# Patient Record
Sex: Male | Born: 1975 | Race: White | Hispanic: No | Marital: Married | Smoking: Never smoker
Health system: Southern US, Community
[De-identification: ages and names within clinical notes are randomized; demographics above are authoritative.]

## PROBLEM LIST (undated history)

## (undated) DIAGNOSIS — Z87820 Personal history of traumatic brain injury: Secondary | ICD-10-CM

## (undated) DIAGNOSIS — F319 Bipolar disorder, unspecified: Secondary | ICD-10-CM

## (undated) DIAGNOSIS — F419 Anxiety disorder, unspecified: Secondary | ICD-10-CM

## (undated) DIAGNOSIS — M47816 Spondylosis without myelopathy or radiculopathy, lumbar region: Secondary | ICD-10-CM

## (undated) DIAGNOSIS — K7581 Nonalcoholic steatohepatitis (NASH): Secondary | ICD-10-CM

## (undated) DIAGNOSIS — K760 Fatty (change of) liver, not elsewhere classified: Secondary | ICD-10-CM

## (undated) DIAGNOSIS — S060XAA Concussion with loss of consciousness status unknown, initial encounter: Secondary | ICD-10-CM

## (undated) DIAGNOSIS — S93699A Other sprain of unspecified foot, initial encounter: Secondary | ICD-10-CM

## (undated) DIAGNOSIS — D751 Secondary polycythemia: Secondary | ICD-10-CM

## (undated) DIAGNOSIS — I2510 Atherosclerotic heart disease of native coronary artery without angina pectoris: Secondary | ICD-10-CM

## (undated) DIAGNOSIS — F32A Depression, unspecified: Secondary | ICD-10-CM

## (undated) DIAGNOSIS — F909 Attention-deficit hyperactivity disorder, unspecified type: Secondary | ICD-10-CM

## (undated) DIAGNOSIS — I219 Acute myocardial infarction, unspecified: Secondary | ICD-10-CM

## (undated) DIAGNOSIS — I77819 Aortic ectasia, unspecified site: Secondary | ICD-10-CM

## (undated) DIAGNOSIS — G250 Essential tremor: Secondary | ICD-10-CM

## (undated) HISTORY — DX: Aortic ectasia, unspecified site: I77.819

## (undated) HISTORY — DX: Anxiety disorder, unspecified: F41.9

## (undated) HISTORY — DX: Concussion with loss of consciousness status unknown, initial encounter: S06.0XAA

## (undated) HISTORY — DX: Atherosclerotic heart disease of native coronary artery without angina pectoris: I25.10

## (undated) HISTORY — DX: Other sprain of unspecified foot, initial encounter: S93.699A

## (undated) HISTORY — DX: Personal history of traumatic brain injury: Z87.820

## (undated) HISTORY — DX: Essential tremor: G25.0

## (undated) HISTORY — DX: Depression, unspecified: F32.A

## (undated) HISTORY — PX: OTHER SURGICAL HISTORY: SHX169

## (undated) HISTORY — DX: Spondylosis without myelopathy or radiculopathy, lumbar region: M47.816

## (undated) HISTORY — DX: Fatty (change of) liver, not elsewhere classified: K76.0

## (undated) HISTORY — DX: Secondary polycythemia: D75.1

## (undated) HISTORY — PX: TRANSTHORACIC ECHOCARDIOGRAM: SHX275

## (undated) HISTORY — DX: Nonalcoholic steatohepatitis (NASH): K75.81

## (undated) HISTORY — PX: EXTERNAL EAR SURGERY: SHX627

---

## 2007-05-12 ENCOUNTER — Emergency Department (HOSPITAL_COMMUNITY): Admission: EM | Admit: 2007-05-12 | Discharge: 2007-05-12 | Payer: Self-pay | Admitting: Emergency Medicine
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
## 2011-05-26 ENCOUNTER — Other Ambulatory Visit: Payer: Self-pay | Admitting: Family Medicine

## 2011-05-26 DIAGNOSIS — R7989 Other specified abnormal findings of blood chemistry: Secondary | ICD-10-CM

## 2011-05-29 ENCOUNTER — Other Ambulatory Visit: Payer: Self-pay

## 2011-07-31 ENCOUNTER — Ambulatory Visit
Admission: RE | Admit: 2011-07-31 | Discharge: 2011-07-31 | Disposition: A | Discharge status: Home / Self Care | Payer: 59 | Source: Ambulatory Visit | Attending: Family Medicine | Admitting: Family Medicine

## 2011-07-31 DIAGNOSIS — R7989 Other specified abnormal findings of blood chemistry: Secondary | ICD-10-CM

## 2017-10-14 ENCOUNTER — Ambulatory Visit: Payer: Self-pay | Admitting: Physician Assistant

## 2017-11-06 ENCOUNTER — Other Ambulatory Visit: Payer: Self-pay | Admitting: Family Medicine

## 2017-11-06 DIAGNOSIS — M25562 Pain in left knee: Secondary | ICD-10-CM

## 2017-11-19 ENCOUNTER — Other Ambulatory Visit: Payer: Self-pay

## 2020-06-18 ENCOUNTER — Other Ambulatory Visit: Payer: Self-pay | Admitting: Family Medicine

## 2020-06-18 DIAGNOSIS — R748 Abnormal levels of other serum enzymes: Secondary | ICD-10-CM

## 2020-07-25 ENCOUNTER — Ambulatory Visit
Admission: RE | Admit: 2020-07-25 | Discharge: 2020-07-25 | Disposition: A | Discharge status: Home / Self Care | Payer: PRIVATE HEALTH INSURANCE | Source: Ambulatory Visit | Attending: Family Medicine | Admitting: Family Medicine

## 2020-07-25 ENCOUNTER — Other Ambulatory Visit: Payer: Self-pay

## 2020-07-25 DIAGNOSIS — R748 Abnormal levels of other serum enzymes: Secondary | ICD-10-CM

## 2020-09-14 ENCOUNTER — Inpatient Hospital Stay: Payer: Medicaid Other | Attending: Hematology & Oncology | Admitting: Hematology & Oncology

## 2020-09-14 ENCOUNTER — Other Ambulatory Visit: Payer: Self-pay

## 2020-09-14 ENCOUNTER — Inpatient Hospital Stay: Payer: Medicaid Other

## 2020-09-14 VITALS — BP 126/83 | HR 77 | Temp 98.6°F | Resp 17 | Ht 74.0 in | Wt 254.0 lb

## 2020-09-14 DIAGNOSIS — D45 Polycythemia vera: Secondary | ICD-10-CM

## 2020-09-14 DIAGNOSIS — S91312A Laceration without foreign body, left foot, initial encounter: Secondary | ICD-10-CM | POA: Insufficient documentation

## 2020-09-14 DIAGNOSIS — D582 Other hemoglobinopathies: Secondary | ICD-10-CM | POA: Insufficient documentation

## 2020-09-14 DIAGNOSIS — R945 Abnormal results of liver function studies: Secondary | ICD-10-CM | POA: Diagnosis not present

## 2020-09-14 DIAGNOSIS — Z79899 Other long term (current) drug therapy: Secondary | ICD-10-CM

## 2020-09-14 DIAGNOSIS — R7989 Other specified abnormal findings of blood chemistry: Secondary | ICD-10-CM | POA: Diagnosis not present

## 2020-09-14 LAB — CBC WITH DIFFERENTIAL (CANCER CENTER ONLY)
Abs Immature Granulocytes: 0.01 10*3/uL (ref 0.00–0.07)
Basophils Absolute: 0.1 10*3/uL (ref 0.0–0.1)
Basophils Relative: 1 %
Eosinophils Absolute: 0 10*3/uL (ref 0.0–0.5)
Eosinophils Relative: 0 %
HCT: 46.2 % (ref 39.0–52.0)
Hemoglobin: 16.2 g/dL (ref 13.0–17.0)
Immature Granulocytes: 0 %
Lymphocytes Relative: 22 %
Lymphs Abs: 1.6 10*3/uL (ref 0.7–4.0)
MCH: 32.1 pg (ref 26.0–34.0)
MCHC: 35.1 g/dL (ref 30.0–36.0)
MCV: 91.7 fL (ref 80.0–100.0)
Monocytes Absolute: 0.6 10*3/uL (ref 0.1–1.0)
Monocytes Relative: 8 %
Neutro Abs: 4.9 10*3/uL (ref 1.7–7.7)
Neutrophils Relative %: 69 %
Platelet Count: 211 10*3/uL (ref 150–400)
RBC: 5.04 MIL/uL (ref 4.22–5.81)
RDW: 12.5 % (ref 11.5–15.5)
WBC Count: 7.1 10*3/uL (ref 4.0–10.5)
nRBC: 0 % (ref 0.0–0.2)

## 2020-09-14 LAB — CMP (CANCER CENTER ONLY)
ALT: 74 U/L — ABNORMAL HIGH (ref 0–44)
AST: 55 U/L — ABNORMAL HIGH (ref 15–41)
Albumin: 4.5 g/dL (ref 3.5–5.0)
Alkaline Phosphatase: 49 U/L (ref 38–126)
Anion gap: 7 (ref 5–15)
BUN: 12 mg/dL (ref 6–20)
CO2: 30 mmol/L (ref 22–32)
Calcium: 9.5 mg/dL (ref 8.9–10.3)
Chloride: 103 mmol/L (ref 98–111)
Creatinine: 1.03 mg/dL (ref 0.61–1.24)
GFR, Estimated: >60 mL/min (ref >60)
Glucose, Bld: 96 mg/dL (ref 70–99)
Potassium: 3.9 mmol/L (ref 3.5–5.1)
Sodium: 140 mmol/L (ref 135–145)
Total Bilirubin: 0.7 mg/dL (ref 0.3–1.2)
Total Protein: 6.8 g/dL (ref 6.5–8.1)

## 2020-09-14 LAB — SAVE SMEAR(SSMR), FOR PROVIDER SLIDE REVIEW

## 2020-09-14 LAB — VITAMIN B12: Vitamin B-12: 390 pg/mL (ref 180–914)
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
## 2020-09-16 LAB — ERYTHROPOIETIN: Erythropoietin: 5.8 m[IU]/mL (ref 2.6–18.5)

## 2020-09-17 ENCOUNTER — Telehealth: Payer: Self-pay | Admitting: Hematology & Oncology

## 2020-09-17 LAB — FERRITIN: Ferritin: 251 ng/mL (ref 24–336)

## 2020-09-17 LAB — IRON AND TIBC
Iron: 94 ug/dL (ref 42–163)
Saturation Ratios: 27 % (ref 20–55)
TIBC: 344 ug/dL (ref 202–409)
UIBC: 249 ug/dL (ref 117–376)

## 2020-09-17 LAB — LACTATE DEHYDROGENASE: LDH: 215 U/L — ABNORMAL HIGH (ref 98–192)

## 2020-09-17 NOTE — Telephone Encounter (Signed)
No los 10/15

## 2020-09-20 LAB — HEMOCHROMATOSIS DNA-PCR(C282Y,H63D)

## 2021-01-10 ENCOUNTER — Other Ambulatory Visit: Payer: Self-pay | Admitting: Nurse Practitioner

## 2021-01-10 DIAGNOSIS — K7402 Hepatic fibrosis, advanced fibrosis: Secondary | ICD-10-CM

## 2021-01-11 ENCOUNTER — Ambulatory Visit
Admission: RE | Admit: 2021-01-11 | Discharge: 2021-01-11 | Disposition: A | Discharge status: Home / Self Care | Payer: Medicaid Other | Source: Ambulatory Visit | Attending: Nurse Practitioner | Admitting: Nurse Practitioner

## 2021-01-11 DIAGNOSIS — K7402 Hepatic fibrosis, advanced fibrosis: Secondary | ICD-10-CM

## 2022-09-26 ENCOUNTER — Ambulatory Visit: Payer: Medicaid Other | Admitting: Nurse Practitioner

## 2022-10-06 ENCOUNTER — Ambulatory Visit: Payer: Medicaid Other

## 2023-09-01 DIAGNOSIS — I214 Non-ST elevation (NSTEMI) myocardial infarction: Secondary | ICD-10-CM

## 2023-09-01 HISTORY — DX: Non-ST elevation (NSTEMI) myocardial infarction: I21.4

## 2023-09-06 ENCOUNTER — Other Ambulatory Visit: Payer: Self-pay

## 2023-09-06 ENCOUNTER — Emergency Department (HOSPITAL_BASED_OUTPATIENT_CLINIC_OR_DEPARTMENT_OTHER): Payer: Medicaid Other | Admitting: Radiology

## 2023-09-06 ENCOUNTER — Emergency Department (HOSPITAL_BASED_OUTPATIENT_CLINIC_OR_DEPARTMENT_OTHER)
Admission: EM | Admit: 2023-09-06 | Discharge: 2023-09-06 | Disposition: A | Discharge status: Home / Self Care | Payer: Medicaid Other | Attending: Emergency Medicine | Admitting: Emergency Medicine

## 2023-09-06 ENCOUNTER — Encounter (HOSPITAL_BASED_OUTPATIENT_CLINIC_OR_DEPARTMENT_OTHER): Payer: Self-pay

## 2023-09-06 DIAGNOSIS — R079 Chest pain, unspecified: Secondary | ICD-10-CM | POA: Insufficient documentation

## 2023-09-06 DIAGNOSIS — R0602 Shortness of breath: Secondary | ICD-10-CM | POA: Diagnosis not present

## 2023-09-06 HISTORY — DX: Bipolar disorder, unspecified: F31.9

## 2023-09-06 HISTORY — DX: Attention-deficit hyperactivity disorder, unspecified type: F90.9

## 2023-09-06 LAB — PROTIME-INR
INR: 1 (ref 0.8–1.2)
Prothrombin Time: 13.8 s (ref 11.4–15.2)

## 2023-09-06 LAB — COMPREHENSIVE METABOLIC PANEL
ALT: 40 U/L (ref 0–44)
AST: 29 U/L (ref 15–41)
Albumin: 4.2 g/dL (ref 3.5–5.0)
Alkaline Phosphatase: 46 U/L (ref 38–126)
Anion gap: 7 (ref 5–15)
BUN: 11 mg/dL (ref 6–20)
CO2: 30 mmol/L (ref 22–32)
Calcium: 9 mg/dL (ref 8.9–10.3)
Chloride: 102 mmol/L (ref 98–111)
Creatinine, Ser: 1.13 mg/dL (ref 0.61–1.24)
GFR, Estimated: >60 mL/min (ref >60)
Glucose, Bld: 99 mg/dL (ref 70–99)
Potassium: 3.7 mmol/L (ref 3.5–5.1)
Sodium: 139 mmol/L (ref 135–145)
Total Bilirubin: 0.5 mg/dL (ref 0.3–1.2)
Total Protein: 6.2 g/dL — ABNORMAL LOW (ref 6.5–8.1)

## 2023-09-06 LAB — CBC
HCT: 43 % (ref 39.0–52.0)
Hemoglobin: 15.4 g/dL (ref 13.0–17.0)
MCH: 32 pg (ref 26.0–34.0)
MCHC: 35.8 g/dL (ref 30.0–36.0)
MCV: 89.2 fL (ref 80.0–100.0)
Platelets: 179 10*3/uL (ref 150–400)
RBC: 4.82 MIL/uL (ref 4.22–5.81)
RDW: 12.8 % (ref 11.5–15.5)
WBC: 6.3 10*3/uL (ref 4.0–10.5)
nRBC: 0 % (ref 0.0–0.2)

## 2023-09-06 LAB — AMMONIA: Ammonia: 23 umol/L (ref 9–35)

## 2023-09-06 LAB — TROPONIN I (HIGH SENSITIVITY)
Troponin I (High Sensitivity): 9 ng/L (ref <18)
Troponin I (High Sensitivity): 9 ng/L (ref <18)

## 2023-09-06 MED ORDER — ASPIRIN 325 MG PO TABS
325.0000 mg | ORAL_TABLET | Freq: Every day | ORAL | Status: DC
  Administered 2023-09-06: 325 mg via ORAL
  Filled 2023-09-06: qty 1

## 2023-09-06 NOTE — Discharge Instructions (Signed)

## 2023-09-06 NOTE — ED Triage Notes (Signed)
Pt presents with ShOB and a "burning" chest pain that started yesterday while hanging decorations. Today pain radiated into jaw and L arm. Symptoms resolve with rest. Denies nausea or diaphoresis.

## 2023-09-06 NOTE — ED Provider Notes (Signed)
[place] EMERGENCY DEPARTMENT AT [facility] Provider Note   CSN: [id number] Arrival date & time: 09/06/23  1435     History  Chief Complaint  Patient presents with   Shortness of Breath    Billy Andersen is a 47 y.o. male who presents to the emergency department with chief complaint of chest pain and shortness of breath.  Patient reports that last night he was going up in his attic to do some work and he felt a burning pain in his chest with breathing like "when you are running outside and its cold."  He states that this made him feel short of breath.  Today he also noticed that this was happening every time he would exert himself some and that he would get some pain in his left jaw and left arm.  He denies nausea, diaphoresis.  Patient does not smoke.  He has no personal history of hypertension, hyperlipidemia or diabetes.  He is unsure of his family history.  He has no pain at rest at this time.  Pain is not worsened with movement of the shoulder.  He denies weakness numbness or tingling.  The history is provided by the patient. No language interpreter was used.  Shortness of Breath      Home Medications Prior to Admission medications   Medication Sig Start Date End Date Taking? Authorizing Provider  DAYTRANA 30 MG/9HR Place 1 patch onto the skin at bedtime. 08/16/20   [provider]      Allergies    Patient has no known allergies.    Review of Systems   Review of Systems  Respiratory:  Positive for shortness of breath.     Physical Exam Updated Vital Signs BP 123/82   Pulse 72   Temp 97.9 F (36.6 C) (Oral)   Resp 12   Ht 6\' 2"  (1.88 m)   Wt 109.8 kg   SpO2 100%   BMI 31.07 kg/m  Physical Exam Vitals and nursing note reviewed.  Constitutional:      General: He is not in acute distress.    Appearance: He is well-developed. He is not diaphoretic.  HENT:     Head: Normocephalic and atraumatic.  Eyes:     General: No scleral  icterus.    Conjunctiva/sclera: Conjunctivae normal.  Cardiovascular:     Rate and Rhythm: Normal rate and regular rhythm.     Heart sounds: Normal heart sounds.  Pulmonary:     Effort: Pulmonary effort is normal. No respiratory distress.     Breath sounds: Normal breath sounds.  Abdominal:     Palpations: Abdomen is soft.     Tenderness: There is no abdominal tenderness.  Musculoskeletal:     Cervical back: Normal range of motion and neck supple.     Right lower leg: No edema.     Left lower leg: No edema.  Skin:    General: Skin is warm and dry.  Neurological:     Mental Status: He is alert.  Psychiatric:        Behavior: Behavior normal.     ED Results / Procedures / Treatments   Labs (all labs ordered are listed, but only abnormal results are displayed) Labs Reviewed  COMPREHENSIVE METABOLIC PANEL - Abnormal; Notable for the following components:      Result Value   Total Protein 6.2 (*)    All other components within normal limits  CBC  PROTIME-INR  AMMONIA  TROPONIN I (HIGH SENSITIVITY)  TROPONIN I (HIGH SENSITIVITY)    EKG None  Radiology DG Chest 2 View  Result Date: 09/06/2023 CLINICAL DATA:  Chest pain. EXAM: CHEST - 2 VIEW COMPARISON:  None Available. FINDINGS: The cardiomediastinal contours are normal. Incidental azygous fissure. Pulmonary vasculature is normal. No consolidation, pleural effusion, or pneumothorax. No acute osseous abnormalities are seen. IMPRESSION: No acute chest findings or explanation for pain. Electronically Signed   By: Narda Rutherford M.D.   On: 09/06/2023 16:03    Procedures Procedures    Medications Ordered in ED Medications  aspirin tablet 325 mg (325 mg Oral Given 09/06/23 1552)    ED Course/ Medical Decision Making/ A&P Clinical Course as of 09/06/23 1824  Sun Sep 06, 2023  1810 Troponin I (High Sensitivity) [AH]  1810 Comprehensive metabolic panel(!) [AH]  1810 Ammonia [AH]  1810 Protime-INR [AH]  1810 CBC [AH]   1810 DG Chest 2 View [AH]  1810 ED EKG [AH]    Clinical Course User Index [AH] Arthor Captain, [name]                                 Medical Decision Making Given the large differential diagnosis for Billy Andersen, the decision making in this case is of high complexity.  After evaluating all of the data points in this case, the presentation of Billy Andersen is NOT consistent with Acute Coronary Syndrome (ACS) and/or myocardial ischemia, pulmonary embolism, aortic dissection; Borhaave's, significant arrythmia, pneumothorax, cardiac tamponade, or other emergent cardiopulmonary condition.  Further, the presentation of Billy Andersen is NOT consistent with pericarditis, myocarditis, cholecystitis, pancreatitis, mediastinitis, endocarditis, new valvular disease.  Additionally, the presentation of Billy Andersen NOT consistent with flail chest, cardiac contusion, ARDS, or significant intra-thoracic or intra-abdominal bleeding.  Moreover, this presentation is NOT consistent with pneumonia, sepsis, or pyelonephritis.  The patient has a   HEART SCORE 1   Strict return and follow-up precautions have been given by me personally or by detailed written instruction given verbally by nursing staff using the teach back method to the patient/family/caregiver(s).  Data Reviewed/Counseling: I have reviewed the patient's vital signs, nursing notes, and other relevant tests/information. I had a detailed discussion regarding the historical points, exam findings, and any diagnostic results supporting the discharge diagnosis. I also discussed the need for outpatient follow-up and the need to return to the ED if symptoms worsen or if there are any questions or concerns that arise at home.    Amount and/or Complexity of Data Reviewed Labs: ordered. Decision-making details documented in ED Course. Radiology: ordered. Decision-making details documented in ED Course. ECG/medicine  tests:  Decision-making details documented in ED Course.  Risk OTC drugs. Risk Details: Perc NEGATIVE           Final Clinical Impression(s) / ED Diagnoses Final diagnoses:  Chest pain in adult    Rx / DC Orders ED Discharge Orders     None         Arthor Captain, [name] 09/06/23 1826    Rondel Baton, MD 09/10/23 1505

## 2023-09-13 ENCOUNTER — Other Ambulatory Visit: Payer: Self-pay

## 2023-09-13 ENCOUNTER — Inpatient Hospital Stay (HOSPITAL_BASED_OUTPATIENT_CLINIC_OR_DEPARTMENT_OTHER)
Admission: EM | Admit: 2023-09-13 | Discharge: 2023-09-15 | DRG: 322 | Disposition: A | Discharge status: Home / Self Care | Payer: Medicaid Other | Attending: Cardiovascular Disease | Admitting: Cardiovascular Disease

## 2023-09-13 ENCOUNTER — Emergency Department (HOSPITAL_BASED_OUTPATIENT_CLINIC_OR_DEPARTMENT_OTHER): Payer: Medicaid Other | Admitting: Radiology

## 2023-09-13 ENCOUNTER — Encounter (HOSPITAL_BASED_OUTPATIENT_CLINIC_OR_DEPARTMENT_OTHER): Payer: Self-pay | Admitting: Emergency Medicine

## 2023-09-13 DIAGNOSIS — I2510 Atherosclerotic heart disease of native coronary artery without angina pectoris: Secondary | ICD-10-CM | POA: Insufficient documentation

## 2023-09-13 DIAGNOSIS — Z23 Encounter for immunization: Secondary | ICD-10-CM

## 2023-09-13 DIAGNOSIS — I214 Non-ST elevation (NSTEMI) myocardial infarction: Principal | ICD-10-CM | POA: Insufficient documentation

## 2023-09-13 DIAGNOSIS — F319 Bipolar disorder, unspecified: Secondary | ICD-10-CM | POA: Diagnosis present

## 2023-09-13 DIAGNOSIS — Z955 Presence of coronary angioplasty implant and graft: Secondary | ICD-10-CM

## 2023-09-13 DIAGNOSIS — Z79899 Other long term (current) drug therapy: Secondary | ICD-10-CM

## 2023-09-13 DIAGNOSIS — I252 Old myocardial infarction: Secondary | ICD-10-CM | POA: Diagnosis not present

## 2023-09-13 DIAGNOSIS — I200 Unstable angina: Secondary | ICD-10-CM | POA: Diagnosis present

## 2023-09-13 DIAGNOSIS — F909 Attention-deficit hyperactivity disorder, unspecified type: Secondary | ICD-10-CM | POA: Diagnosis present

## 2023-09-13 DIAGNOSIS — E785 Hyperlipidemia, unspecified: Secondary | ICD-10-CM | POA: Diagnosis present

## 2023-09-13 DIAGNOSIS — K7581 Nonalcoholic steatohepatitis (NASH): Secondary | ICD-10-CM | POA: Diagnosis present

## 2023-09-13 DIAGNOSIS — Z8249 Family history of ischemic heart disease and other diseases of the circulatory system: Secondary | ICD-10-CM | POA: Diagnosis not present

## 2023-09-13 LAB — TROPONIN I (HIGH SENSITIVITY)
Troponin I (High Sensitivity): 164 ng/L (ref <18)
Troponin I (High Sensitivity): 144 ng/L (ref <18)
Troponin I (High Sensitivity): 203 ng/L (ref <18)
Troponin I (High Sensitivity): 220 ng/L (ref <18)

## 2023-09-13 LAB — CBC WITH DIFFERENTIAL/PLATELET
Abs Immature Granulocytes: 0.01 10*3/uL (ref 0.00–0.07)
Basophils Absolute: 0 10*3/uL (ref 0.0–0.1)
Basophils Relative: 0 %
Eosinophils Absolute: 0 10*3/uL (ref 0.0–0.5)
Eosinophils Relative: 0 %
HCT: 46 % (ref 39.0–52.0)
Hemoglobin: 16.2 g/dL (ref 13.0–17.0)
Immature Granulocytes: 0 %
Lymphocytes Relative: 35 %
Lymphs Abs: 1.8 10*3/uL (ref 0.7–4.0)
MCH: 31.3 pg (ref 26.0–34.0)
MCHC: 35.2 g/dL (ref 30.0–36.0)
MCV: 88.8 fL (ref 80.0–100.0)
Monocytes Absolute: 0.5 10*3/uL (ref 0.1–1.0)
Monocytes Relative: 10 %
Neutro Abs: 2.8 10*3/uL (ref 1.7–7.7)
Neutrophils Relative %: 55 %
Platelets: 202 10*3/uL (ref 150–400)
RBC: 5.18 MIL/uL (ref 4.22–5.81)
RDW: 12.8 % (ref 11.5–15.5)
WBC: 5 10*3/uL (ref 4.0–10.5)
nRBC: 0 % (ref 0.0–0.2)

## 2023-09-13 LAB — COMPREHENSIVE METABOLIC PANEL
ALT: 47 U/L — ABNORMAL HIGH (ref 0–44)
AST: 34 U/L (ref 15–41)
Albumin: 4.6 g/dL (ref 3.5–5.0)
Alkaline Phosphatase: 42 U/L (ref 38–126)
Anion gap: 7 (ref 5–15)
BUN: 10 mg/dL (ref 6–20)
CO2: 31 mmol/L (ref 22–32)
Calcium: 9.1 mg/dL (ref 8.9–10.3)
Chloride: 103 mmol/L (ref 98–111)
Creatinine, Ser: 0.87 mg/dL (ref 0.61–1.24)
GFR, Estimated: >60 mL/min (ref >60)
Glucose, Bld: 109 mg/dL — ABNORMAL HIGH (ref 70–99)
Potassium: 3.4 mmol/L — ABNORMAL LOW (ref 3.5–5.1)
Sodium: 141 mmol/L (ref 135–145)
Total Bilirubin: 0.7 mg/dL (ref 0.3–1.2)
Total Protein: 6.9 g/dL (ref 6.5–8.1)

## 2023-09-13 LAB — HIV ANTIBODY (ROUTINE TESTING W REFLEX): HIV Screen 4th Generation wRfx: NONREACTIVE

## 2023-09-13 LAB — HEMOGLOBIN A1C
Hgb A1c MFr Bld: 5.1 % (ref 4.8–5.6)
Mean Plasma Glucose: 99.67 mg/dL

## 2023-09-13 LAB — HEPARIN LEVEL (UNFRACTIONATED): Heparin Unfractionated: 0.22 [IU]/mL — ABNORMAL LOW (ref 0.30–0.70)

## 2023-09-13 LAB — D-DIMER, QUANTITATIVE: D-Dimer, Quant: <0.27 ug{FEU}/mL (ref 0.00–0.50)

## 2023-09-13 MED ORDER — HEPARIN BOLUS VIA INFUSION
4000.0000 [IU] | Freq: Once | INTRAVENOUS | Status: AC
  Administered 2023-09-13: 4000 [IU] via INTRAVENOUS

## 2023-09-13 MED ORDER — NITROGLYCERIN 0.4 MG SL SUBL
0.4000 mg | SUBLINGUAL_TABLET | SUBLINGUAL | Status: DC | PRN
  Administered 2023-09-13: 0.4 mg via SUBLINGUAL
  Filled 2023-09-13: qty 1

## 2023-09-13 MED ORDER — LAMOTRIGINE 100 MG PO TABS
150.0000 mg | ORAL_TABLET | Freq: Every day | ORAL | Status: DC
  Administered 2023-09-13 – 2023-09-14 (×2): 150 mg via ORAL
  Filled 2023-09-13 (×2): qty 2

## 2023-09-13 MED ORDER — ASPIRIN 81 MG PO CHEW
324.0000 mg | CHEWABLE_TABLET | ORAL | Status: AC
  Administered 2023-09-13: 324 mg via ORAL
  Filled 2023-09-13: qty 4

## 2023-09-13 MED ORDER — HEPARIN (PORCINE) 25000 UT/250ML-% IV SOLN
1600.0000 [IU]/h | INTRAVENOUS | Status: DC
  Administered 2023-09-13: 1200 [IU]/h via INTRAVENOUS
  Administered 2023-09-14: 1400 [IU]/h via INTRAVENOUS
  Filled 2023-09-13 (×2): qty 250

## 2023-09-13 MED ORDER — ONDANSETRON HCL 4 MG/2ML IJ SOLN: 4.0000 mg | Freq: Four times a day (QID) | INTRAMUSCULAR | Status: DC | PRN

## 2023-09-13 MED ORDER — NITROGLYCERIN 0.4 MG SL SUBL: 0.4000 mg | SUBLINGUAL_TABLET | SUBLINGUAL | Status: DC | PRN

## 2023-09-13 MED ORDER — ASPIRIN 325 MG PO TABS: 325.0000 mg | ORAL_TABLET | Freq: Every day | ORAL | Status: DC

## 2023-09-13 MED ORDER — ASPIRIN 81 MG PO TBEC
81.0000 mg | DELAYED_RELEASE_TABLET | Freq: Every day | ORAL | Status: DC
  Administered 2023-09-15: 81 mg via ORAL
  Filled 2023-09-13: qty 1

## 2023-09-13 MED ORDER — LUMATEPERONE TOSYLATE 42 MG PO CAPS
42.0000 mg | ORAL_CAPSULE | Freq: Every day | ORAL | Status: DC
  Administered 2023-09-13: 42 mg via ORAL
  Filled 2023-09-13 (×2): qty 1

## 2023-09-13 MED ORDER — MORPHINE SULFATE (PF) 4 MG/ML IV SOLN
4.0000 mg | Freq: Once | INTRAVENOUS | Status: AC
  Administered 2023-09-13: 4 mg via INTRAVENOUS
  Filled 2023-09-13: qty 1

## 2023-09-13 MED ORDER — HYDROXYZINE HCL 25 MG PO TABS: 25.0000 mg | ORAL_TABLET | Freq: Two times a day (BID) | ORAL | Status: DC | PRN

## 2023-09-13 MED ORDER — METHYLPHENIDATE 30 MG/9HR TD PTCH
1.0000 | MEDICATED_PATCH | Freq: Every day | TRANSDERMAL | Status: DC
  Filled 2023-09-13: qty 1

## 2023-09-13 MED ORDER — ASPIRIN 300 MG RE SUPP
300.0000 mg | RECTAL | Status: AC
  Filled 2023-09-13: qty 1

## 2023-09-13 MED ORDER — INFLUENZA VIRUS VACC SPLIT PF (FLUZONE) 0.5 ML IM SUSY
0.5000 mL | PREFILLED_SYRINGE | INTRAMUSCULAR | Status: AC
  Administered 2023-09-15: 0.5 mL via INTRAMUSCULAR
  Filled 2023-09-13: qty 0.5

## 2023-09-13 MED ORDER — NITROGLYCERIN IN D5W 200-5 MCG/ML-% IV SOLN
0.0000 ug/min | INTRAVENOUS | Status: DC
  Administered 2023-09-13: 5 ug/min via INTRAVENOUS
  Filled 2023-09-13: qty 250

## 2023-09-13 MED ORDER — MELATONIN 3 MG PO TABS
3.0000 mg | ORAL_TABLET | Freq: Every day | ORAL | Status: DC
  Administered 2023-09-13 – 2023-09-14 (×2): 3 mg via ORAL
  Filled 2023-09-13 (×2): qty 1

## 2023-09-13 MED ORDER — ACETAMINOPHEN 325 MG PO TABS: 650.0000 mg | ORAL_TABLET | ORAL | Status: DC | PRN

## 2023-09-13 MED ORDER — ATOMOXETINE HCL 40 MG PO CAPS
80.0000 mg | ORAL_CAPSULE | Freq: Every day | ORAL | Status: DC
  Administered 2023-09-14 – 2023-09-15 (×2): 80 mg via ORAL
  Filled 2023-09-13: qty 1
  Filled 2023-09-13 (×3): qty 2

## 2023-09-13 MED ORDER — ASPIRIN 81 MG PO CHEW
324.0000 mg | CHEWABLE_TABLET | Freq: Once | ORAL | Status: AC
  Administered 2023-09-13: 324 mg via ORAL
  Filled 2023-09-13: qty 4

## 2023-09-13 MED ORDER — ROSUVASTATIN CALCIUM 20 MG PO TABS
40.0000 mg | ORAL_TABLET | Freq: Every day | ORAL | Status: DC
  Administered 2023-09-13 – 2023-09-15 (×3): 40 mg via ORAL
  Filled 2023-09-13 (×3): qty 2

## 2023-09-13 NOTE — ED Notes (Signed)
Verbal notified MD of Trop of 164.

## 2023-09-13 NOTE — ED Provider Notes (Signed)
Assumed care from previous provider. Patient transferred from [facility] for cardiology consultation due to NSTEMI.  See previous provider note for full HPI. In short, patient is a 47 year old male who presents to the ED due to chest pain x 1 week.  Seen in the ED recently with 2 negative troponins and reassuring workup.  Patient states pain worsened today which prompted him to report to the ED.  Troponin elevated.  Patient treated with heparin for suspected NSTEMI. Physical Exam  BP 128/80 (BP Location: Left Arm)   Pulse (!) 57   Temp 98.1 F (36.7 C) (Oral)   Resp 16   Ht 6\' 2"  (1.88 m)   Wt 109.8 kg   SpO2 100%   BMI 31.08 kg/m   Physical Exam Vitals and nursing note reviewed.  Constitutional:      General: He is not in acute distress.    Appearance: He is not ill-appearing.     Comments: Resting comfortably in bed  HENT:     Head: Normocephalic.  Eyes:     Pupils: Pupils are equal, round, and reactive to light.  Cardiovascular:     Rate and Rhythm: Normal rate and regular rhythm.     Pulses: Normal pulses.     Heart sounds: Normal heart sounds. No murmur heard.    No friction rub. No gallop.  Pulmonary:     Effort: Pulmonary effort is normal.     Breath sounds: Normal breath sounds.  Abdominal:     General: Abdomen is flat. There is no distension.     Palpations: Abdomen is soft.     Tenderness: There is no abdominal tenderness. There is no guarding or rebound.  Musculoskeletal:        General: Normal range of motion.     Cervical back: Neck supple.  Skin:    General: Skin is warm and dry.  Neurological:     General: No focal deficit present.     Mental Status: He is alert.  Psychiatric:        Mood and Affect: Mood normal.        Behavior: Behavior normal.     Procedures  Procedures  ED Course / MDM   Clinical Course as of 09/13/23 1112  Sun Sep 13, 2023  0910 Troponin elevated.  On reassessment pain is improved, 3 out of 10.  Clear for they only have  baby aspirin this morning, will give full dose aspirin, nitroglycerin to improve his pain.  Cardiology paged. [JD]  [id number] Nahser cards: ED to ED at [facility] [JD]    Clinical Course User Index [JD] Laurence Spates, MD   Medical Decision Making Amount and/or Complexity of Data Reviewed Labs: ordered. Radiology: ordered.  Risk OTC drugs. Prescription drug management. Decision regarding hospitalization.   11:12 AM reassessed patient at bedside.  Cardiology at bedside.  Patient resting comfortably in bed.  Plan for possible catheterization tomorrow.  Patient able to eat today.  Will start nitroglycerin per cardiology. 2nd troponin pending. Dr. Elease Hashimoto with cardiology to admit.         Jesusita Oka 09/13/23 1136    Gerhard Munch, MD 09/13/23 [phone]

## 2023-09-13 NOTE — Progress Notes (Signed)
PHARMACY - ANTICOAGULATION CONSULT NOTE  Pharmacy Consult for IV heparin Indication: chest pain/ACS  No Known Allergies  Patient Measurements: Height: 6\' 2"  (188 cm) Weight: 109.8 kg (242 lb 1 oz) IBW/kg (Calculated) : 82.2 Heparin Dosing Weight: 104.9 kg  Vital Signs: Temp: 97.6 F (36.4 C) (10/13 0803) BP: 128/81 (10/13 0803) Pulse Rate: 63 (10/13 0803)  Labs: Recent Labs    09/13/23 0819  HGB 16.2  HCT 46.0  PLT 202  CREATININE 0.87  TROPONINIHS 164*    Estimated Creatinine Clearance: 139.9 mL/min (by C-G formula based on SCr of 0.87 mg/dL).   Medical History: Past Medical History:  Diagnosis Date   ADHD    Bipolar 1 disorder [facility])     Assessment: Billy Andersen is a 47 y.o. year old male admitted on 09/13/2023 with concern for ACS. No anticoagulation prior to admission. Pharmacy consulted to dose heparin.   Goal of Therapy:  Heparin level 0.3-0.7 units/ml Monitor platelets by anticoagulation protocol: Yes   Plan:  Heparin 4000 units x 1 as bolus followed by heparin infusion at 1200 units/hr 6 heparin level  Daily heparin level, CBC, and monitoring for bleeding F/u plans for anticoagulation    Thank you for allowing pharmacy to participate in this patient's care.  Marja Kays, PharmD Emergency Medicine Clinical Pharmacist 09/13/2023,9:57 AM

## 2023-09-13 NOTE — Plan of Care (Signed)

## 2023-09-13 NOTE — ED Notes (Signed)
[facility] at [facility] will send transport for ED to [facility] ED transfer Dr Tegeler accepting.-ABB(NS)

## 2023-09-13 NOTE — ED Notes (Signed)
Nutritional services notified to bring patient a meal tray.

## 2023-09-13 NOTE — Progress Notes (Signed)
PHARMACY - ANTICOAGULATION CONSULT NOTE  Pharmacy Consult for IV heparin Indication: chest pain/ACS  No Known Allergies  Patient Measurements: Height: 6\' 2"  (188 cm) Weight: 109.8 kg (242 lb 1 oz) IBW/kg (Calculated) : 82.2 Heparin Dosing Weight: 104.9 kg  Vital Signs: Temp: 97.7 F (36.5 C) (10/13 1457) Temp Source: Oral (10/13 1457) BP: 147/98 (10/13 1457) Pulse Rate: 73 (10/13 1457)  Labs: Recent Labs    09/13/23 0819 09/13/23 1050 09/13/23 1516  HGB 16.2  --   --   HCT 46.0  --   --   PLT 202  --   --   HEPARINUNFRC  --   --  0.22*  CREATININE 0.87  --   --   TROPONINIHS 164* 144* 203*    Estimated Creatinine Clearance: 139.9 mL/min (by C-G formula based on SCr of 0.87 mg/dL).   Medical History: Past Medical History:  Diagnosis Date   ADHD    Bipolar 1 disorder [facility])     Assessment: Billy Andersen is a 47 y.o. year old male admitted on 09/13/2023 with concern for ACS. No anticoagulation prior to admission. Pharmacy consulted to dose heparin.  Heparin level came back slightly subtherapeutic at 0.22, on 1200 units/hr. Hgb 16.2, plt 202. Trop 203. No s/sx of bleeding or infusion issues.    Goal of Therapy:  Heparin level 0.3-0.7 units/ml Monitor platelets by anticoagulation protocol: Yes   Plan:  Increase heparin infusion to 1400 units/hr  6 heparin level  Daily heparin level, CBC, and monitoring for bleeding F/u plans for anticoagulation   Thank you for allowing pharmacy to participate in this patient's care,  Sherron Monday, PharmD, BCCCP Clinical Pharmacist  Phone: [phone] 09/13/2023 5:10 PM  Please check AMION for all [facility] Pharmacy phone numbers After 10:00 PM, call Main Pharmacy [phone]

## 2023-09-13 NOTE — Progress Notes (Signed)
Unfractionated heparin level resulted 0.22. message sent to [facility] pharmacist.

## 2023-09-13 NOTE — H&P (Signed)
Cardiology Admission History and Physical   Patient ID: Billy Andersen MRN: [id number]; DOB: [birth year]   Admission date: 09/13/2023  PCP:  Dois Davenport, MD   [facility] Providers Cardiologist:    Click here to update MD or APP on Care Team, Refresh:1}     Chief Complaint:  chest pain   Patient Profile:   Billy Andersen is a 47 y.o. male with recent onset of CP  who is being seen 09/13/2023 for the evaluation of  NSTEMI.  History of Present Illness:   Billy Andersen is a relatively healthy 47 year old gentleman who started having episodes of chest discomfort last week.  The onset was described as a chest pressure with radiation to the left side of his jaw.  It was associated with significant shortness of breath.  Last week it was occurring with daily activities.  He has had stuttering chest pain for the past week and early this morning he woke up with severe chest discomfort with radiation to the left jaw.  He found the symptoms concerning and presented to [facility].  He is found to have mildly elevated troponin levels.  He has been started on IV nitroglycerin and is pain-free.  Does not report any hyperlipidemia.  He is a non-smoker.  His father had a history of coronary artery disease in his 94s.  There is no pleuritic component to the chest discomfort.  Past Medical History:  Diagnosis Date   ADHD    Bipolar 1 disorder (HCC)     Past Surgical History:  Procedure Laterality Date   EXTERNAL EAR SURGERY       Medications Prior to Admission: Prior to Admission medications   Medication Sig Start Date End Date Taking? Authorizing Provider  DAYTRANA 30 MG/9HR Place 1 patch onto the skin at bedtime. 08/16/20   [provider]     Allergies:   No Known Allergies  Social History:   Social History   Socioeconomic History   Marital status: Married    Spouse name: Not on file   Number of children: Not on file    Years of education: Not on file   Highest education level: Not on file  Occupational History   Not on file  Tobacco Use   Smoking status: Never   Smokeless tobacco: Never  Vaping Use   Vaping status: Never Used  Substance and Sexual Activity   Alcohol use: Yes   Drug use: Never   Sexual activity: Not on file  Other Topics Concern   Not on file  Social History Narrative   ** Merged History Encounter **       Social Determinants of Health   Financial Resource Strain: Not on file  Food Insecurity: Not on file  Transportation Needs: Not on file  Physical Activity: Not on file  Stress: Not on file  Social Connections: Unknown (09/09/2023)   Received from [facility]   Social Network    Social Network: Not on file  Intimate Partner Violence: Unknown (09/09/2023)   Received from [facility]   HITS    Physically Hurt: Not on file    Insult or Talk Down To: Not on file    Threaten Physical Harm: Not on file    Scream or Curse: Not on file    Family History:  +  CAD in his father  The patient's family history is not on file.    ROS:  Please see the history of  present illness.  All other ROS reviewed and negative.     Physical Exam/Data:   Vitals:   09/13/23 0900 09/13/23 0930 09/13/23 1000 09/13/23 1043  BP:  (!) 140/93 116/76 128/80  Pulse:  63 61 (!) 57  Resp:  13 10 16   Temp:    98.1 F (36.7 C)  TempSrc:    Oral  SpO2:  97% 100% 100%  Weight: 109.8 kg     Height: 6\' 2"  (1.88 m)      No intake or output data in the 24 hours ending 09/13/23 1055    09/13/2023    9:00 AM 09/06/2023    2:43 PM 09/14/2020    4:04 PM  Last 3 Weights  Weight (lbs) 242 lb 1 oz 242 lb 254 lb  Weight (kg) 109.8 kg 109.77 kg 115.214 kg     Body mass index is 31.08 kg/m.  General:  Well nourished, well developed, in no acute distress HEENT: normal Neck: no JVD Vascular: No carotid bruits; Distal pulses 2+ bilaterally   Cardiac:  normal S1, S2; RRR; no murmur  Lungs:  clear  to auscultation bilaterally, no wheezing, rhonchi or rales  Abd: soft, nontender, no hepatomegaly  Ext: no edema Musculoskeletal:  No deformities, BUE and BLE strength normal and equal Skin: warm and dry  Neuro:  CNs 2-12 intact, no focal abnormalities noted Psych:  Normal affect    EKG:   NSR , no ST or T wave changes   Relevant CV Studies:   Laboratory Data:  High Sensitivity Troponin:   Recent Labs  Lab 09/06/23 1543 09/06/23 1719 09/13/23 0819  TROPONINIHS 9 9 164*      Chemistry Recent Labs  Lab 09/06/23 1543 09/13/23 0819  NA 139 141  K 3.7 3.4*  CL 102 103  CO2 30 31  GLUCOSE 99 109*  BUN 11 10  CREATININE 1.13 0.87  CALCIUM 9.0 9.1  GFRNONAA >60 >60  ANIONGAP 7 7    Recent Labs  Lab 09/06/23 1543 09/13/23 0819  PROT 6.2* 6.9  ALBUMIN 4.2 4.6  AST 29 34  ALT 40 47*  ALKPHOS 46 42  BILITOT 0.5 0.7   Lipids No results for input(s): "CHOL", "TRIG", "HDL", "LABVLDL", "LDLCALC", "CHOLHDL" in the last 168 hours. Hematology Recent Labs  Lab 09/06/23 1543 09/13/23 0819  WBC 6.3 5.0  RBC 4.82 5.18  HGB 15.4 16.2  HCT 43.0 46.0  MCV 89.2 88.8  MCH 32.0 31.3  MCHC 35.8 35.2  RDW 12.8 12.8  PLT 179 202   Thyroid No results for input(s): "TSH", "FREET4" in the last 168 hours. BNPNo results for input(s): "BNP", "PROBNP" in the last 168 hours.  DDimer  Recent Labs  Lab 09/13/23 0819  DDIMER <0.27     Radiology/Studies:  DG Chest 2 View  Result Date: 09/13/2023 CLINICAL DATA:  Chest pain EXAM: CHEST - 2 VIEW COMPARISON:  09/06/2023. FINDINGS: Cardiac silhouette is unremarkable. No pneumothorax or pleural effusion. The lungs are clear. The visualized skeletal structures are unremarkable. IMPRESSION: No acute cardiopulmonary process. Electronically Signed   By: Layla Maw M.D.   On: 09/13/2023 09:04     Assessment and Plan:   Non-ST segment elevation myocardial infarction: Billy Andersen presents with stuttering chest pain for the past week  or so.  His troponins were negative last week.  Today his troponin levels are mildly elevated.  I suspect that he has tight blockage.  I cannot completely exclude coronary spasm although I think that  it is unlikely and is non-smoker.  In addition, he denies any pleuritic chest pain, is not tachycardic, and is not short of breath at rest.  I doubt that this is a pulmonary embolus although this will need to be considered if he is not found to have significant coronary artery disease.  We discussed further workup including a heart catheterization hopefully in the next day or so.  We have discussed the risks, benefits, options of heart catheterization.  He understands and agrees to proceed.  2.  Probable hyperlipidemia: I do not see any lipid levels in our system.  He does not eat a healthy diet.  He he eats lots of processed foods.  We discussed the importance of eating fresh fruits and vegetables and lean meats.   Risk Assessment/Risk Scores:    TIMI Risk Score for Unstable Angina or Non-ST Elevation MI:   The patient's TIMI risk score is  , which indicates a  % risk of all cause mortality, new or recurrent myocardial infarction or need for urgent revascularization in the next 14 days.      Code Status: Full Code  Severity of Illness: The appropriate patient status for this patient is INPATIENT. Inpatient status is judged to be reasonable and necessary in order to provide the required intensity of service to ensure the patient's safety. The patient's presenting symptoms, physical exam findings, and initial radiographic and laboratory data in the context of their chronic comorbidities is felt to place them at high risk for further clinical deterioration. Furthermore, it is not anticipated that the patient will be medically stable for discharge from the hospital within 2 midnights of admission.   * I certify that at the point of admission it is my clinical judgment that the patient will require  inpatient hospital care spanning beyond 2 midnights from the point of admission due to high intensity of service, high risk for further deterioration and high frequency of surveillance required.*   For questions or updates, please contact [facility] Please consult [email or web address] for contact info under     Signed, Kristeen Miss, MD  09/13/2023 10:55 AM

## 2023-09-13 NOTE — ED Notes (Signed)
Pain before nitroglycerin 3/10... after nitroglycerin 1/10.Marland KitchenMarland Kitchen

## 2023-09-13 NOTE — ED Notes (Signed)
Pt ambulated to restroom with RN without incident.

## 2023-09-13 NOTE — ED Notes (Signed)
ED TO INPATIENT HANDOFF REPORT  ED Nurse Name and Phone #: Cat [phone]  S Name/Age/Gender Billy Andersen 47 y.o. male Room/Bed: 045C/045C  Code Status   Code Status: Full Code  Home/SNF/Other Home Patient oriented to: self, place, time, and situation Is this baseline? Yes   Triage Complete: Triage complete  Chief Complaint Unstable angina (HCC) [I20.0]  Triage Note Pt was here a week ago with chest pain ,sharp to left side chest and left arm pain, along with shortness of breath with exertion,was seen and has outpt appt with cards. Pain has been there all week,but today pain was sharper and shortness of breath worse with exertion.    Allergies No Known Allergies  Level of Care/Admitting Diagnosis ED Disposition     ED Disposition  Admit   Condition  --   Comment  Hospital Area: [facility] [[id number]]  Level of Care: Telemetry Cardiac [103]  May admit patient to [facility] or [facility] if equivalent level of care is available:: No  Covid Evaluation: Asymptomatic - no recent exposure (last 10 days) testing not required  Diagnosis: Unstable angina [facility]) [454098]  Admitting Physician: Vesta Mixer [phone]  Attending Physician: Vesta Mixer [phone]  Certification:: I certify this patient will need inpatient services for at least 2 midnights  Expected Medical Readiness: 09/15/2023          B Medical/Surgery History Past Medical History:  Diagnosis Date   ADHD    Bipolar 1 disorder (HCC)    Past Surgical History:  Procedure Laterality Date   EXTERNAL EAR SURGERY       A IV Location/Drains/Wounds Patient Lines/Drains/Airways Status     Active Line/Drains/Airways     Name Placement date Placement time Site Days   Peripheral IV 09/13/23 20 G Right Antecubital 09/13/23  0807  Antecubital  less than 1   Peripheral IV 09/13/23 20 G Left Antecubital 09/13/23  1106  Antecubital  less than 1            Intake/Output Last 24  hours No intake or output data in the 24 hours ending 09/13/23 1414  Labs/Imaging Results for orders placed or performed during the hospital encounter of 09/13/23 (from the past 48 hour(s))  Comprehensive metabolic panel     Status: Abnormal   Collection Time: 09/13/23  8:19 AM  Result Value Ref Range   Sodium 141 135 - 145 mmol/L   Potassium 3.4 (L) 3.5 - 5.1 mmol/L   Chloride 103 98 - 111 mmol/L   CO2 31 22 - 32 mmol/L   Glucose, Bld 109 (H) 70 - 99 mg/dL    Comment: Glucose reference range applies only to samples taken after fasting for at least 8 hours.   BUN 10 6 - 20 mg/dL   Creatinine, Ser 9.56 0.61 - 1.24 mg/dL   Calcium 9.1 8.9 - 21.3 mg/dL   Total Protein 6.9 6.5 - 8.1 g/dL   Albumin 4.6 3.5 - 5.0 g/dL   AST 34 15 - 41 U/L   ALT 47 (H) 0 - 44 U/L   Alkaline Phosphatase 42 38 - 126 U/L   Total Bilirubin 0.7 0.3 - 1.2 mg/dL   GFR, Estimated >08 >65 mL/min    Comment: (NOTE) Calculated using the CKD-EPI Creatinine Equation (2021)    Anion gap 7 5 - 15    Comment: Performed at [facility], [address] [place], [place] [address]  Troponin I (High Sensitivity)  Status: Abnormal   Collection Time: 09/13/23  8:19 AM  Result Value Ref Range   Troponin I (High Sensitivity) 164 (HH) <18 ng/L    Comment: CRITICAL RESULT CALLED TO, READ BACK BY AND VERIFIED WITH: A BARLOW,RN 1610 09/13/2023 DBRADLEY (NOTE) Elevated high sensitivity troponin I (hsTnI) values and significant  changes across serial measurements may suggest ACS but many other  chronic and acute conditions are known to elevate hsTnI results.  Refer to the Links section for chest pain algorithms and additional  guidance. Performed at [facility], [address], [place], [place] [address]   CBC with Differential     Status: None   Collection Time: 09/13/23  8:19 AM  Result Value Ref Range   WBC 5.0 4.0 - 10.5 K/uL   RBC 5.18 4.22 - 5.81 MIL/uL   Hemoglobin 16.2  13.0 - 17.0 g/dL   HCT 40.9 81.1 - 91.4 %   MCV 88.8 80.0 - 100.0 fL   MCH 31.3 26.0 - 34.0 pg   MCHC 35.2 30.0 - 36.0 g/dL   RDW 78.2 95.6 - 21.3 %   Platelets 202 150 - 400 K/uL   nRBC 0.0 0.0 - 0.2 %   Neutrophils Relative % 55 %   Neutro Abs 2.8 1.7 - 7.7 K/uL   Lymphocytes Relative 35 %   Lymphs Abs 1.8 0.7 - 4.0 K/uL   Monocytes Relative 10 %   Monocytes Absolute 0.5 0.1 - 1.0 K/uL   Eosinophils Relative 0 %   Eosinophils Absolute 0.0 0.0 - 0.5 K/uL   Basophils Relative 0 %   Basophils Absolute 0.0 0.0 - 0.1 K/uL   Immature Granulocytes 0 %   Abs Immature Granulocytes 0.01 0.00 - 0.07 K/uL    Comment: Performed at [facility], [address], [place], [place] [address]  D-dimer, quantitative     Status: None   Collection Time: 09/13/23  8:19 AM  Result Value Ref Range   D-Dimer, Quant <0.27 0.00 - 0.50 ug/mL-FEU    Comment: (NOTE) At the manufacturer cut-off value of 0.5 g/mL FEU, this assay has a negative predictive value of 95-100%.This assay is intended for use in conjunction with a clinical pretest probability (PTP) assessment model to exclude pulmonary embolism (PE) and deep venous thrombosis (DVT) in outpatients suspected of PE or DVT. Results should be correlated with clinical presentation. Performed at [facility], [address] [place], [place] [address]   Troponin I (High Sensitivity)     Status: Abnormal   Collection Time: 09/13/23 10:50 AM  Result Value Ref Range   Troponin I (High Sensitivity) 144 (HH) <18 ng/L    Comment: CRITICAL RESULT CALLED TO Jess Barters, RN, READ BACK BY AND VERIFIED WITH W SMITH AT 1222 ON 10.13.2024 (NOTE) Elevated high sensitivity troponin I (hsTnI) values and significant  changes across serial measurements may suggest ACS but many other  chronic and acute conditions are known to elevate hsTnI results.  Refer to the "Links" section for chest pain algorithms and additional   guidance. Performed at [facility], [address] [place], [place] [address]    DG Chest 2 View  Result Date: 09/13/2023 CLINICAL DATA:  Chest pain EXAM: CHEST - 2 VIEW COMPARISON:  09/06/2023. FINDINGS: Cardiac silhouette is unremarkable. No pneumothorax or pleural effusion. The lungs are clear. The visualized skeletal structures are unremarkable. IMPRESSION: No acute cardiopulmonary process. Electronically Signed   By: Layla Maw M.D.   On: 09/13/2023 09:04  Pending Labs Unresulted Labs (From admission, onward)     Start     Ordered   09/14/23 0500  CBC  Daily,   R      09/13/23 0959   09/14/23 0500  Heparin level (unfractionated)  Daily,   R      09/13/23 1048   09/13/23 1600  Heparin level (unfractionated)  Once-Timed,   URGENT        09/13/23 1048   Signed and Held  HIV Antibody (routine testing w rflx)  (HIV Antibody (Routine testing w reflex) panel)  Once,   R        Signed and Held   Signed and Held  Lipoprotein A (LPA)  Tomorrow morning,   R        Signed and Held   Signed and Held  Hemoglobin A1c  Once,   R        Signed and Held   Signed and Held  Basic metabolic panel  Tomorrow morning,   R        Signed and Held   Signed and Held  Lipid panel  Tomorrow morning,   R        Signed and Held   Signed and Held  CBC  Tomorrow morning,   R        Signed and Held            Vitals/Pain Today's Vitals   09/13/23 1230 09/13/23 1300 09/13/23 1301 09/13/23 1413  BP: (!) 137/98 (!) 135/98    Pulse: 61 77    Resp: (!) 22 19    Temp:    98 F (36.7 C)  TempSrc:    Oral  SpO2: 100% 100%    Weight:      Height:      PainSc:  0-No pain 0-No pain     Isolation Precautions No active isolations  Medications Medications  heparin ADULT infusion 100 units/mL (25000 units/240mL) (1,200 Units/hr Intravenous New Bag/Given 09/13/23 0936)  nitroGLYCERIN 50 mg in dextrose 5 % 250 mL (0.2 mg/mL) infusion (10 mcg/min Intravenous Rate/Dose Change 09/13/23  1339)  morphine (PF) 4 MG/ML injection 4 mg (4 mg Intravenous Given 09/13/23 0824)  heparin bolus via infusion 4,000 Units (4,000 Units Intravenous Bolus from Bag 09/13/23 0937)  aspirin chewable tablet 324 mg (324 mg Oral Given 09/13/23 0931)    Mobility walks     Focused Assessments Cardiac Assessment Handoff:  Cardiac Rhythm: Normal sinus rhythm No results found for: "CKTOTAL", "CKMB", "CKMBINDEX", "TROPONINI" Lab Results  Component Value Date   DDIMER <0.27 09/13/2023   Does the Patient currently have chest pain? No    R Recommendations: See Admitting Provider Note  Report given to:   Additional Notes:

## 2023-09-13 NOTE — ED Notes (Signed)
Provided pt bag lunch and pillow.

## 2023-09-13 NOTE — ED Notes (Signed)
Report given to [facility]

## 2023-09-13 NOTE — ED Notes (Signed)
Pt bib [facility] from [facility] c/o cp that started a week ago. Pt initially rated pain 10/10. Pt was given 4 mg Morphine and 1 Nitroglycerin which brought pain to 1/10.  BP 120/80 HR 70 RA 100 GCS 15

## 2023-09-13 NOTE — ED Triage Notes (Signed)
Pt was here a week ago with chest pain ,sharp to left side chest and left arm pain, along with shortness of breath with exertion,was seen and has outpt appt with cards. Pain has been there all week,but today pain was sharper and shortness of breath worse with exertion.

## 2023-09-13 NOTE — ED Notes (Signed)
Report given to the Charge at [facility].

## 2023-09-13 NOTE — ED Notes (Signed)
CRITICAL VALUE STICKER  CRITICAL VALUE:trop 165  RECEIVER (on-site recipient of call):Romyn Boswell,rn  DATE & TIME NOTIFIED:0905 09/13/23   MESSENGER (representative from lab):  MD NOTIFIED: davis, jonathan  TIME OF NOTIFICATION:0910  RESPONSE:

## 2023-09-13 NOTE — Progress Notes (Signed)
Pt admitted to cardiology service from [facility] for chest pain concerning for angina and elevated troponin.  Continue heparin and nitroglycerin gtts.   Planning for heart cath tomorrow. Discussed home medications with Dr. Elease Hashimoto, orders entered.   NPO at [facility] tonight.

## 2023-09-13 NOTE — ED Provider Notes (Signed)
[place] EMERGENCY DEPARTMENT AT [facility] Provider Note   CSN: [id number] Arrival date & time: 09/13/23  4098     History  No chief complaint on file.   Billy Andersen is a 47 y.o. male.  HPI 25 old male history of ADHD and bipolar 1 presenting for chest pain.  Symptoms initially started October 6.  He was seen in the ED at that time, had 2 negative troponins, chest x-ray and was discharged home.  He has a scheduled cardiology follow-up but not until November.  Throughout the week he is has not exertional chest pain or shortness of breath.  Today when he got up to go to the bathroom he had severe worsened sharp pain left side of his chest that radiates to his left arm.  No radiation to his back or back pain.  He is felt short of breath as well.  Pain is quite severe for 40 minutes.  Took some aspirin at home has mostly improved but still having some tightness and pain to his left arm.  No numbness or tingling.  No nausea vomiting diaphoresis.  He is no history of hypertension, diabetes.  His father had heart disease but unsure what age.  He does not smoke.  No history of PE.  No leg swelling,.  Pain or recent travel.     Home Medications Prior to Admission medications   Medication Sig Start Date End Date Taking? Authorizing Provider  DAYTRANA 30 MG/9HR Place 1 patch onto the skin at bedtime. 08/16/20   [provider]      Allergies    Patient has no known allergies.    Review of Systems   Review of Systems Review of systems completed and notable as per HPI.  ROS otherwise negative.   Physical Exam Updated Vital Signs BP 116/76   Pulse 61   Temp 97.6 F (36.4 C)   Resp 10   Ht 6\' 2"  (1.88 m)   Wt 109.8 kg   SpO2 100%   BMI 31.08 kg/m  Physical Exam Vitals and nursing note reviewed.  Constitutional:      General: He is not in acute distress.    Appearance: He is well-developed.  HENT:     Head: Normocephalic and atraumatic.  Eyes:      Conjunctiva/sclera: Conjunctivae normal.  Cardiovascular:     Rate and Rhythm: Normal rate and regular rhythm.     Pulses: Normal pulses.     Heart sounds: Normal heart sounds. No murmur heard. Pulmonary:     Effort: Pulmonary effort is normal. No respiratory distress.     Breath sounds: Normal breath sounds.  Abdominal:     Palpations: Abdomen is soft.     Tenderness: There is no abdominal tenderness.  Musculoskeletal:        General: No swelling.     Cervical back: Neck supple.     Right lower leg: No edema.     Left lower leg: No edema.  Skin:    General: Skin is warm and dry.     Capillary Refill: Capillary refill takes less than 2 seconds.  Neurological:     Mental Status: He is alert.  Psychiatric:        Mood and Affect: Mood normal.     ED Results / Procedures / Treatments   Labs (all labs ordered are listed, but only abnormal results are displayed) Labs Reviewed  COMPREHENSIVE METABOLIC PANEL - Abnormal; Notable for the following components:  Result Value   Potassium 3.4 (*)    Glucose, Bld 109 (*)    ALT 47 (*)    All other components within normal limits  TROPONIN I (HIGH SENSITIVITY) - Abnormal; Notable for the following components:   Troponin I (High Sensitivity) 164 (*)    All other components within normal limits  CBC WITH DIFFERENTIAL/PLATELET  D-DIMER, QUANTITATIVE  HEPARIN LEVEL (UNFRACTIONATED)  TROPONIN I (HIGH SENSITIVITY)    EKG None  Radiology DG Chest 2 View  Result Date: 09/13/2023 CLINICAL DATA:  Chest pain EXAM: CHEST - 2 VIEW COMPARISON:  09/06/2023. FINDINGS: Cardiac silhouette is unremarkable. No pneumothorax or pleural effusion. The lungs are clear. The visualized skeletal structures are unremarkable. IMPRESSION: No acute cardiopulmonary process. Electronically Signed   By: Layla Maw M.D.   On: 09/13/2023 09:04    Procedures Procedures    Medications Ordered in ED Medications  nitroGLYCERIN (NITROSTAT) SL tablet 0.4  mg (0.4 mg Sublingual Given 09/13/23 0933)  heparin ADULT infusion 100 units/mL (25000 units/234mL) (1,200 Units/hr Intravenous New Bag/Given 09/13/23 0936)  morphine (PF) 4 MG/ML injection 4 mg (4 mg Intravenous Given 09/13/23 0824)  heparin bolus via infusion 4,000 Units (4,000 Units Intravenous Bolus from Bag 09/13/23 0937)  aspirin chewable tablet 324 mg (324 mg Oral Given 09/13/23 0931)    ED Course/ Medical Decision Making/ A&P Clinical Course as of 09/13/23 1017  Sun Sep 13, 2023  0910 Troponin elevated.  On reassessment pain is improved, 3 out of 10.  Clear for they only have baby aspirin this morning, will give full dose aspirin, nitroglycerin to improve his pain.  Cardiology paged. [JD]  [id number] Nahser cards: ED to ED at [facility] [JD]    Clinical Course User Index [JD] Laurence Spates, MD             HEART Score: 3                    Medical Decision Making Amount and/or Complexity of Data Reviewed Labs: ordered. Radiology: ordered.  Risk OTC drugs. Prescription drug management.   Medical Decision Making:   Billy Andersen is a 47 y.o. male who presented to the ED today with chest pain.  Vital signs reviewed.  EKG reviewed, no significant change from prior, no signs of acute ischemia.  Will evaluate for ACS with troponin.  Hear score of 3.  Based on history exam and low suspicion for dissection.  Will add on D-dimer for evaluation of possible PE.  Patient placed on continuous vitals and telemetry monitoring while in ED which was reviewed periodically.  Reviewed and confirmed nursing documentation for past medical history, family history, social history.  Reassessment and Plan:   Troponin is elevated concerning for NSTEMI.  Discussed with Dr. Elease Hashimoto with cardiology who reviewed EKG and presentation.  Heparin ordered and aspirin ordered. patient is nearly pain-free at this time.  Accepted ED to ED for evaluation by Dr. Elease Hashimoto.  Transferred for further care.   Patient's  presentation is most consistent with acute presentation with potential threat to life or bodily function.           Final Clinical Impression(s) / ED Diagnoses Final diagnoses:  NSTEMI (non-ST elevated myocardial infarction) [facility])    Rx / DC Orders ED Discharge Orders     None         Laurence Spates, MD 09/13/23 1017

## 2023-09-13 NOTE — ED Notes (Signed)
Transported to xray 

## 2023-09-14 ENCOUNTER — Other Ambulatory Visit: Payer: Self-pay

## 2023-09-14 ENCOUNTER — Encounter (HOSPITAL_COMMUNITY)
Admission: EM | Disposition: A | Discharge status: Home / Self Care | Payer: Self-pay | Source: Home / Self Care | Attending: Cardiovascular Disease

## 2023-09-14 ENCOUNTER — Encounter (HOSPITAL_COMMUNITY): Payer: Self-pay | Admitting: Cardiovascular Disease

## 2023-09-14 ENCOUNTER — Other Ambulatory Visit (HOSPITAL_COMMUNITY): Payer: Medicaid Other

## 2023-09-14 ENCOUNTER — Other Ambulatory Visit (HOSPITAL_COMMUNITY): Payer: Self-pay

## 2023-09-14 DIAGNOSIS — I2510 Atherosclerotic heart disease of native coronary artery without angina pectoris: Secondary | ICD-10-CM | POA: Diagnosis not present

## 2023-09-14 HISTORY — PX: LEFT HEART CATH AND CORONARY ANGIOGRAPHY: CATH118249

## 2023-09-14 HISTORY — PX: CORONARY STENT INTERVENTION: CATH118234

## 2023-09-14 LAB — HEPARIN LEVEL (UNFRACTIONATED)
Heparin Unfractionated: 0.23 [IU]/mL — ABNORMAL LOW (ref 0.30–0.70)
Heparin Unfractionated: 0.33 [IU]/mL (ref 0.30–0.70)

## 2023-09-14 LAB — LIPID PANEL
Cholesterol: 147 mg/dL (ref 0–200)
HDL: 40 mg/dL — ABNORMAL LOW (ref >40)
LDL Cholesterol: 79 mg/dL (ref 0–99)
Total CHOL/HDL Ratio: 3.7 {ratio}
Triglycerides: 142 mg/dL (ref <150)
VLDL: 28 mg/dL (ref 0–40)

## 2023-09-14 LAB — BASIC METABOLIC PANEL
Anion gap: 14 (ref 5–15)
BUN: 11 mg/dL (ref 6–20)
CO2: 24 mmol/L (ref 22–32)
Calcium: 8.6 mg/dL — ABNORMAL LOW (ref 8.9–10.3)
Chloride: 104 mmol/L (ref 98–111)
Creatinine, Ser: 1.02 mg/dL (ref 0.61–1.24)
GFR, Estimated: >60 mL/min (ref >60)
Glucose, Bld: 128 mg/dL — ABNORMAL HIGH (ref 70–99)
Potassium: 3.9 mmol/L (ref 3.5–5.1)
Sodium: 142 mmol/L (ref 135–145)

## 2023-09-14 LAB — CBC
HCT: 42.9 % (ref 39.0–52.0)
Hemoglobin: 14.8 g/dL (ref 13.0–17.0)
MCH: 30.6 pg (ref 26.0–34.0)
MCHC: 34.5 g/dL (ref 30.0–36.0)
MCV: 88.6 fL (ref 80.0–100.0)
Platelets: 180 10*3/uL (ref 150–400)
RBC: 4.84 MIL/uL (ref 4.22–5.81)
RDW: 12.7 % (ref 11.5–15.5)
WBC: 5.1 10*3/uL (ref 4.0–10.5)
nRBC: 0 % (ref 0.0–0.2)

## 2023-09-14 LAB — POCT ACTIVATED CLOTTING TIME: Activated Clotting Time: 262 s

## 2023-09-14 SURGERY — LEFT HEART CATH AND CORONARY ANGIOGRAPHY
Anesthesia: LOCAL

## 2023-09-14 MED ORDER — SODIUM CHLORIDE 0.9 % WEIGHT BASED INFUSION: 1.0000 mL/kg/h | INTRAVENOUS | Status: AC

## 2023-09-14 MED ORDER — HEPARIN BOLUS VIA INFUSION
1000.0000 [IU] | Freq: Once | INTRAVENOUS | Status: AC
  Administered 2023-09-14: 1000 [IU] via INTRAVENOUS
  Filled 2023-09-14: qty 1000

## 2023-09-14 MED ORDER — TICAGRELOR 90 MG PO TABS
90.0000 mg | ORAL_TABLET | Freq: Two times a day (BID) | ORAL | 0 refills | Status: DC
  Filled 2023-09-14: qty 60, 30d supply, fill #0

## 2023-09-14 MED ORDER — HEPARIN SODIUM (PORCINE) 1000 UNIT/ML IJ SOLN
INTRAMUSCULAR | Status: AC
  Filled 2023-09-14: qty 10

## 2023-09-14 MED ORDER — SODIUM CHLORIDE 0.9 % WEIGHT BASED INFUSION: 3.0000 mL/kg/h | INTRAVENOUS | Status: DC

## 2023-09-14 MED ORDER — NITROGLYCERIN 1 MG/10 ML FOR IR/CATH LAB
INTRA_ARTERIAL | Status: DC | PRN
  Administered 2023-09-14: 200 ug via INTRACORONARY

## 2023-09-14 MED ORDER — SODIUM CHLORIDE 0.9 % WEIGHT BASED INFUSION: 1.0000 mL/kg/h | INTRAVENOUS | Status: DC

## 2023-09-14 MED ORDER — TICAGRELOR 90 MG PO TABS
ORAL_TABLET | ORAL | Status: DC | PRN
  Administered 2023-09-14: 180 mg via ORAL

## 2023-09-14 MED ORDER — LUMATEPERONE TOSYLATE 42 MG PO CAPS
42.0000 mg | ORAL_CAPSULE | Freq: Every day | ORAL | Status: DC
  Administered 2023-09-14: 42 mg via ORAL
  Filled 2023-09-14 (×2): qty 1

## 2023-09-14 MED ORDER — METOPROLOL SUCCINATE ER 25 MG PO TB24
25.0000 mg | ORAL_TABLET | Freq: Every day | ORAL | 2 refills | Status: DC
  Filled 2023-09-14: qty 30, 30d supply, fill #0

## 2023-09-14 MED ORDER — SODIUM CHLORIDE 0.9 % WEIGHT BASED INFUSION
3.0000 mL/kg/h | INTRAVENOUS | Status: DC
  Administered 2023-09-14: 3 mL/kg/h via INTRAVENOUS

## 2023-09-14 MED ORDER — IOHEXOL 350 MG/ML SOLN
INTRAVENOUS | Status: DC | PRN
  Administered 2023-09-14: 84 mL

## 2023-09-14 MED ORDER — FENTANYL CITRATE (PF) 100 MCG/2ML IJ SOLN
INTRAMUSCULAR | Status: AC
  Filled 2023-09-14: qty 2

## 2023-09-14 MED ORDER — SODIUM CHLORIDE 0.9 % IV SOLN: 250.0000 mL | INTRAVENOUS | Status: DC | PRN

## 2023-09-14 MED ORDER — ALUM & MAG HYDROXIDE-SIMETH 200-200-20 MG/5ML PO SUSP
ORAL | Status: AC
  Filled 2023-09-14: qty 30

## 2023-09-14 MED ORDER — NITROGLYCERIN 1 MG/10 ML FOR IR/CATH LAB
INTRA_ARTERIAL | Status: AC
  Filled 2023-09-14: qty 10

## 2023-09-14 MED ORDER — VERAPAMIL HCL 2.5 MG/ML IV SOLN
INTRAVENOUS | Status: DC | PRN
  Administered 2023-09-14: 10 mL via INTRA_ARTERIAL

## 2023-09-14 MED ORDER — ROSUVASTATIN CALCIUM 40 MG PO TABS
40.0000 mg | ORAL_TABLET | Freq: Every day | ORAL | 2 refills | Status: DC
  Filled 2023-09-14: qty 30, 30d supply, fill #0

## 2023-09-14 MED ORDER — MIDAZOLAM HCL 2 MG/2ML IJ SOLN
INTRAMUSCULAR | Status: AC
  Filled 2023-09-14: qty 2

## 2023-09-14 MED ORDER — ASPIRIN 81 MG PO CHEW
81.0000 mg | CHEWABLE_TABLET | Freq: Every day | ORAL | 0 refills | Status: DC
  Filled 2023-09-14: qty 30, 30d supply, fill #0

## 2023-09-14 MED ORDER — FENTANYL CITRATE (PF) 100 MCG/2ML IJ SOLN
50.0000 ug | Freq: Once | INTRAMUSCULAR | Status: AC
  Administered 2023-09-14: 50 ug via INTRAVENOUS

## 2023-09-14 MED ORDER — VERAPAMIL HCL 2.5 MG/ML IV SOLN
INTRAVENOUS | Status: AC
  Filled 2023-09-14: qty 2

## 2023-09-14 MED ORDER — METOPROLOL SUCCINATE ER 25 MG PO TB24
25.0000 mg | ORAL_TABLET | Freq: Every day | ORAL | Status: DC
  Administered 2023-09-14 – 2023-09-15 (×2): 25 mg via ORAL
  Filled 2023-09-14 (×2): qty 1

## 2023-09-14 MED ORDER — TICAGRELOR 90 MG PO TABS
90.0000 mg | ORAL_TABLET | Freq: Two times a day (BID) | ORAL | Status: DC
  Administered 2023-09-14 – 2023-09-15 (×2): 90 mg via ORAL
  Filled 2023-09-14 (×2): qty 1

## 2023-09-14 MED ORDER — NITROGLYCERIN 0.4 MG SL SUBL
0.4000 mg | SUBLINGUAL_TABLET | SUBLINGUAL | 1 refills | Status: AC | PRN
  Filled 2023-09-14: qty 25, 5d supply, fill #0

## 2023-09-14 MED ORDER — ALUM & MAG HYDROXIDE-SIMETH 200-200-20 MG/5ML PO SUSP: 5.0000 mL | Freq: Four times a day (QID) | ORAL | Status: DC | PRN

## 2023-09-14 MED ORDER — TICAGRELOR 90 MG PO TABS
ORAL_TABLET | ORAL | Status: AC
  Filled 2023-09-14: qty 2

## 2023-09-14 MED ORDER — SODIUM CHLORIDE 0.9% FLUSH: 3.0000 mL | INTRAVENOUS | Status: DC | PRN

## 2023-09-14 MED ORDER — LIDOCAINE HCL (PF) 1 % IJ SOLN
INTRAMUSCULAR | Status: DC | PRN
  Administered 2023-09-14: 2 mL

## 2023-09-14 MED ORDER — HEPARIN SODIUM (PORCINE) 1000 UNIT/ML IJ SOLN
INTRAMUSCULAR | Status: DC | PRN
  Administered 2023-09-14: 3000 [IU] via INTRAVENOUS
  Administered 2023-09-14: 6000 [IU] via INTRAVENOUS

## 2023-09-14 MED ORDER — HEPARIN (PORCINE) IN NACL 2000-0.9 UNIT/L-% IV SOLN
INTRAVENOUS | Status: DC | PRN
  Administered 2023-09-14: 1000 mL

## 2023-09-14 MED ORDER — ASPIRIN 81 MG PO CHEW
81.0000 mg | CHEWABLE_TABLET | ORAL | Status: AC
  Administered 2023-09-14: 81 mg via ORAL
  Filled 2023-09-14: qty 1

## 2023-09-14 MED ORDER — LIDOCAINE HCL (PF) 1 % IJ SOLN
INTRAMUSCULAR | Status: AC
  Filled 2023-09-14: qty 30

## 2023-09-14 MED ORDER — FENTANYL CITRATE (PF) 100 MCG/2ML IJ SOLN
INTRAMUSCULAR | Status: DC | PRN
  Administered 2023-09-14: 50 ug via INTRAVENOUS
  Administered 2023-09-14: 25 ug via INTRAVENOUS

## 2023-09-14 MED ORDER — SODIUM CHLORIDE 0.9% FLUSH
3.0000 mL | Freq: Two times a day (BID) | INTRAVENOUS | Status: DC
  Administered 2023-09-14: 3 mL via INTRAVENOUS

## 2023-09-14 MED ORDER — ALUM & MAG HYDROXIDE-SIMETH 200-200-20 MG/5ML PO SUSP
30.0000 mL | Freq: Four times a day (QID) | ORAL | Status: DC | PRN
  Administered 2023-09-14: 30 mL via ORAL

## 2023-09-14 MED ORDER — MIDAZOLAM HCL 2 MG/2ML IJ SOLN
INTRAMUSCULAR | Status: DC | PRN
  Administered 2023-09-14: 2 mg via INTRAVENOUS

## 2023-09-14 SURGICAL SUPPLY — 13 items
BALLN EMERGE MR 2.0X20 (BALLOONS) ×1
CATH INFINITI AMBI 5FR TG (CATHETERS) ×1
CATH VISTA GUIDE 6FR JR4 (CATHETERS) ×1
DEVICE RAD COMP TR BAND LRG (VASCULAR PRODUCTS) ×1
ELECT DEFIB PAD ADLT CADENCE (PAD) ×1
GLIDESHEATH SLEND A-KIT 6F 22G (SHEATH) ×1
INQWIRE 1.5J .035X260CM (WIRE) ×1
KIT ENCORE 26 ADVANTAGE (KITS) ×1
PACK CARDIAC CATHETERIZATION (CUSTOM PROCEDURE TRAY) ×1
SET ATX-X65L (MISCELLANEOUS) ×1
SYNERGY XD 3.50X16 (Permanent Stent) ×1 IMPLANT
TUBING CIL FLEX 10 FLL-RA (TUBING) ×1
WIRE RUNTHROUGH .014X180CM (WIRE) ×1

## 2023-09-14 NOTE — Progress Notes (Deleted)
Rounding Note    Patient Name: Billy Andersen Date of Encounter: 09/14/2023  [facility] Cardiologist: Kristeen Miss, MD   Subjective   2-3 left shoulder blade pain. Intermittent dyspnea  Inpatient Medications    Scheduled Meds:  aspirin EC  81 mg Oral Daily   atomoxetine  80 mg Oral Daily   influenza vac split trivalent PF  0.5 mL Intramuscular Tomorrow-1000   lamoTRIgine  150 mg Oral QHS   lumateperone tosylate  42 mg Oral Daily   melatonin  3 mg Oral QHS   rosuvastatin  40 mg Oral Daily   Continuous Infusions:  sodium chloride 1 mL/kg/hr (09/14/23 0446)   heparin 1,600 Units/hr (09/14/23 0148)   nitroGLYCERIN 50 mcg/min (09/14/23 0722)   PRN Meds: acetaminophen, nitroGLYCERIN, ondansetron (ZOFRAN) IV   Vital Signs    Vitals:   09/14/23 0638 09/14/23 0639 09/14/23 0648 09/14/23 0650  BP: 112/69 113/69 (!) 98/55 90/61  Pulse: 67 72 70 65  Resp: 14 16 20 18   Temp:      TempSrc:      SpO2: 97% 96% 99% 98%  Weight:      Height:        Intake/Output Summary (Last 24 hours) at 09/14/2023 0746 Last data filed at 09/14/2023 0618 Gross per 24 hour  Intake 883.79 ml  Output --  Net 883.79 ml      09/13/2023    9:00 AM 09/06/2023    2:43 PM 09/14/2020    4:04 PM  Last 3 Weights  Weight (lbs) 242 lb 1 oz 242 lb 254 lb  Weight (kg) 109.8 kg 109.77 kg 115.214 kg      Telemetry    NSR without significant ventricular ectopy - Personally Reviewed  ECG    NSR without significant ST-T wave changes - Personally Reviewed  Physical Exam   GEN: No acute distress.   Neck: No JVD Cardiac: RRR, no murmurs, rubs, or gallops.  Respiratory: Clear to auscultation bilaterally. GI: Soft, nontender, non-distended  MS: No edema; No deformity. Neuro:  Nonfocal  Psych: Normal affect   Labs    High Sensitivity Troponin:   Recent Labs  Lab 09/06/23 1719 09/13/23 0819 09/13/23 1050 09/13/23 1516 09/13/23 1716  TROPONINIHS 9 164* 144* 203*  220*     Chemistry Recent Labs  Lab 09/13/23 0819 09/14/23 0020  NA 141 142  K 3.4* 3.9  CL 103 104  CO2 31 24  GLUCOSE 109* 128*  BUN 10 11  CREATININE 0.87 1.02  CALCIUM 9.1 8.6*  PROT 6.9  --   ALBUMIN 4.6  --   AST 34  --   ALT 47*  --   ALKPHOS 42  --   BILITOT 0.7  --   GFRNONAA >60 >60  ANIONGAP 7 14    Lipids  Recent Labs  Lab 09/14/23 0020  CHOL 147  TRIG 142  HDL 40*  LDLCALC 79  CHOLHDL 3.7    Hematology Recent Labs  Lab 09/13/23 0819 09/14/23 0020  WBC 5.0 5.1  RBC 5.18 4.84  HGB 16.2 14.8  HCT 46.0 42.9  MCV 88.8 88.6  MCH 31.3 30.6  MCHC 35.2 34.5  RDW 12.8 12.7  PLT 202 180   Thyroid No results for input(s): "TSH", "FREET4" in the last 168 hours.  BNPNo results for input(s): "BNP", "PROBNP" in the last 168 hours.  DDimer  Recent Labs  Lab 09/13/23 [phone]  DDIMER <0.27     Radiology  DG Chest 2 View  Result Date: 09/13/2023 CLINICAL DATA:  Chest pain EXAM: CHEST - 2 VIEW COMPARISON:  09/06/2023. FINDINGS: Cardiac silhouette is unremarkable. No pneumothorax or pleural effusion. The lungs are clear. The visualized skeletal structures are unremarkable. IMPRESSION: No acute cardiopulmonary process. Electronically Signed   By: Layla Maw M.D.   On: 09/13/2023 09:04    Cardiac Studies   N/S  Patient Profile     47 y.o. male who presented with left shoulder blade pain and found to have elevated trop. Pending cardiac cath today.   Assessment & Plan    NSTEMI  - intermittent left shoulder blade pain radiating to chest for the past week. Trop mildly elevated.   - pending cardiac catheterization today, benefit and risk of the procedure has been discussed yesterday. - recurrent chest discomfort this morning, on nitroglycerine gtt. BP soft. Continue on ASA and rosuvastatin.  - pending echo  Hyperlipidemia: on high dose statin  For questions or updates, please contact [facility] Please consult [email or web address] for  contact info under        Signed, Azalee Course, PA  09/14/2023, 7:46 AM

## 2023-09-14 NOTE — Progress Notes (Addendum)
PHARMACY - ANTICOAGULATION CONSULT NOTE  Pharmacy Consult for IV heparin Indication: chest pain/ACS  No Known Allergies  Patient Measurements: Height: 6\' 2"  (188 cm) Weight: 109.8 kg (242 lb 1 oz) IBW/kg (Calculated) : 82.2 Heparin Dosing Weight: 104.9 kg  Vital Signs: Temp: 97.8 F (36.6 C) (10/14 0022) Temp Source: Oral (10/14 0022) BP: 116/71 (10/14 0048) Pulse Rate: 76 (10/14 0022)  Labs: Recent Labs    09/13/23 0819 09/13/23 1050 09/13/23 1516 09/13/23 1716 09/14/23 0020  HGB 16.2  --   --   --  14.8  HCT 46.0  --   --   --  42.9  PLT 202  --   --   --  180  HEPARINUNFRC  --   --  0.22*  --  0.23*  CREATININE 0.87  --   --   --   --   TROPONINIHS 164* 144* 203* 220*  --     Estimated Creatinine Clearance: 139.9 mL/min (by C-G formula based on SCr of 0.87 mg/dL).   Medical History: Past Medical History:  Diagnosis Date   ADHD    Bipolar 1 disorder [facility])     Assessment: Billy Andersen is a 48 y.o. year old male admitted on 09/13/2023 with concern for ACS. No anticoagulation prior to admission. Pharmacy consulted to dose heparin.  Heparin level came back subtherapeutic at 0.23, on 1400 units/hr. Hgb 16.2>14.8, plt 202>180. Trop 220. No s/sx of bleeding or infusion issues per RN   Goal of Therapy:  Heparin level 0.3-0.7 units/ml Monitor platelets by anticoagulation protocol: Yes   Plan:  Bolus 1000 units of heparin x1 Increase heparin infusion to 1600 units/hr  6 heparin level  Daily heparin level, CBC, and monitoring for bleeding F/u plans for [facility]  Thank you for allowing pharmacy to participate in this patient's care,  Arabella Merles, PharmD. Clinical Pharmacist 09/14/2023 1:02 AM

## 2023-09-14 NOTE — Progress Notes (Signed)
TR BAND REMOVAL  LOCATION:    radial  right radial  DEFLATED PER PROTOCOL:   yes  TIME BAND OFF / DRESSING APPLIED:    1530/gauze and tegaderm  SITE UPON ARRIVAL:    Level 0  SITE AFTER BAND REMOVAL:    Level 0  CIRCULATION SENSATION AND MOVEMENT:    Within Normal Limits :  rt hand and fingers warm and pink, palpable radial pulse, good pleth waveform, sensation present  COMMENTS:

## 2023-09-14 NOTE — Progress Notes (Signed)
Patient complaining of 3-4/10 pain that in in left back and radiates to left arm. Also endorse SOB while voice texting wife. Denies N/V. EKG obtained and Nitroglycerin gtt increased to 45 mcg with subsiding of pain to 1-2/10. On-call provider notified of pain and increase of gtt w/ soft Bps.

## 2023-09-14 NOTE — Interval H&P Note (Signed)
History and Physical Interval Note:  09/14/2023 10:50 AM  Billy Andersen  has presented today for surgery, with the diagnosis of NSTEMI.  The various methods of treatment have been discussed with the patient and family. After consideration of risks, benefits and other options for treatment, the patient has consented to  Procedure(s): LEFT HEART CATH AND CORONARY ANGIOGRAPHY (N/A) and possible coronary intervention as a surgical intervention.  The patient's history has been reviewed, patient examined, no change in status, stable for surgery.  I have reviewed the patient's chart and labs.  Questions were answered to the patient's satisfaction.    Cath Lab Visit (complete for each Cath Lab visit)  Clinical Evaluation Leading to the Procedure:   ACS: Yes.    Non-ACS:    Anginal Classification: CCS IV  Anti-ischemic medical therapy: No Therapy  Non-Invasive Test Results: No non-invasive testing performed  Prior CABG: No previous CABG   Yates Decamp

## 2023-09-14 NOTE — Progress Notes (Signed)
Cardiology Admission History and Physical   Patient ID: Billy Andersen MRN: [id number]; DOB: Jun 18, 1976   Admission date: 09/13/2023  PCP:  Dois Davenport, MD   [facility] Providers Cardiologist:   Nahser   Subjective:  No pain this am       Physical Exam/Data:   Vitals:   09/14/23 0638 09/14/23 0639 09/14/23 0648 09/14/23 0650  BP: 112/69 113/69 (!) 98/55 90/61  Pulse: 67 72 70 65  Resp: 14 16 20 18   Temp:      TempSrc:      SpO2: 97% 96% 99% 98%  Weight:      Height:        Intake/Output Summary (Last 24 hours) at 09/14/2023 0749 Last data filed at 09/14/2023 0618 Gross per 24 hour  Intake 883.79 ml  Output --  Net 883.79 ml      09/13/2023    9:00 AM 09/06/2023    2:43 PM 09/14/2020    4:04 PM  Last 3 Weights  Weight (lbs) 242 lb 1 oz 242 lb 254 lb  Weight (kg) 109.8 kg 109.77 kg 115.214 kg     Body mass index is 31.08 kg/m.  General:  Well nourished, well developed, in no acute distress HEENT: normal Neck: no JVD Vascular: No carotid bruits; Distal pulses 2+ bilaterally   Cardiac:  normal S1, S2; RRR; no murmur  Lungs:  clear to auscultation bilaterally, no wheezing, rhonchi or rales  Abd: soft, nontender, no hepatomegaly  Ext: no edema Musculoskeletal:  No deformities, BUE and BLE strength normal and equal Skin: warm and dry  Neuro:  CNs 2-12 intact, no focal abnormalities noted Psych:  Normal affect    EKG:   NSR , no ST or T wave changes   Relevant CV Studies:   Laboratory Data:  High Sensitivity Troponin:   Recent Labs  Lab 09/06/23 1719 09/13/23 0819 09/13/23 1050 09/13/23 1516 09/13/23 1716  TROPONINIHS 9 164* 144* 203* 220*      Chemistry Recent Labs  Lab 09/13/23 0819 09/14/23 0020  NA 141 142  K 3.4* 3.9  CL 103 104  CO2 31 24  GLUCOSE 109* 128*  BUN 10 11  CREATININE 0.87 1.02  CALCIUM 9.1 8.6*  GFRNONAA >60 >60  ANIONGAP 7 14    Recent Labs  Lab 09/13/23 0819  PROT 6.9  ALBUMIN  4.6  AST 34  ALT 47*  ALKPHOS 42  BILITOT 0.7   Lipids  Recent Labs  Lab 09/14/23 0020  CHOL 147  TRIG 142  HDL 40*  LDLCALC 79  CHOLHDL 3.7   Hematology Recent Labs  Lab 09/13/23 0819 09/14/23 0020  WBC 5.0 5.1  RBC 5.18 4.84  HGB 16.2 14.8  HCT 46.0 42.9  MCV 88.8 88.6  MCH 31.3 30.6  MCHC 35.2 34.5  RDW 12.8 12.7  PLT 202 180   Thyroid No results for input(s): "TSH", "FREET4" in the last 168 hours. BNPNo results for input(s): "BNP", "PROBNP" in the last 168 hours.  DDimer  Recent Labs  Lab 09/13/23 0819  DDIMER <0.27     Radiology/Studies:  DG Chest 2 View  Result Date: 09/13/2023 CLINICAL DATA:  Chest pain EXAM: CHEST - 2 VIEW COMPARISON:  09/06/2023. FINDINGS: Cardiac silhouette is unremarkable. No pneumothorax or pleural effusion. The lungs are clear. The visualized skeletal structures are unremarkable. IMPRESSION: No acute cardiopulmonary process. Electronically Signed   By: Layla Maw M.D.   On: 09/13/2023 09:04  Assessment and Plan:   Non-ST segment elevation myocardial infarction: Billy Needle presents with stuttering chest pain for the past week or so.  His troponins were negative last week.  Today his troponin levels are mildly elevated. For cath today per Dr Elease Hashimoto.   Shared Decision Making/Informed Consent The risks [stroke (1 in 1000), death (1 in 1000), kidney failure [usually temporary] (1 in 500), bleeding (1 in 200), allergic reaction [possibly serious] (1 in 200)], benefits (diagnostic support and management of coronary artery disease) and alternatives of a cardiac catheterization were discussed in detail with Ms. Wanita Chamberlain and she is willing to proceed.   He understands and agrees to proceed.  2.  Probable hyperlipidemia: I do not see any lipid levels in our system.  He does not eat a healthy diet.  He he eats lots of processed foods.  We discussed the importance of eating fresh fruits and vegetables and lean meats.    Signed, Charlton Haws, MD  09/14/2023 7:49 AM

## 2023-09-14 NOTE — H&P (View-Only) (Signed)
Cardiology Admission History and Physical   Patient ID: Billy Andersen MRN: [id number]; DOB: Jun 18, 1976   Admission date: 09/13/2023  PCP:  Dois Davenport, MD   [facility] Providers Cardiologist:   Nahser   Subjective:  No pain this am       Physical Exam/Data:   Vitals:   09/14/23 0638 09/14/23 0639 09/14/23 0648 09/14/23 0650  BP: 112/69 113/69 (!) 98/55 90/61  Pulse: 67 72 70 65  Resp: 14 16 20 18   Temp:      TempSrc:      SpO2: 97% 96% 99% 98%  Weight:      Height:        Intake/Output Summary (Last 24 hours) at 09/14/2023 0749 Last data filed at 09/14/2023 0618 Gross per 24 hour  Intake 883.79 ml  Output --  Net 883.79 ml      09/13/2023    9:00 AM 09/06/2023    2:43 PM 09/14/2020    4:04 PM  Last 3 Weights  Weight (lbs) 242 lb 1 oz 242 lb 254 lb  Weight (kg) 109.8 kg 109.77 kg 115.214 kg     Body mass index is 31.08 kg/m.  General:  Well nourished, well developed, in no acute distress HEENT: normal Neck: no JVD Vascular: No carotid bruits; Distal pulses 2+ bilaterally   Cardiac:  normal S1, S2; RRR; no murmur  Lungs:  clear to auscultation bilaterally, no wheezing, rhonchi or rales  Abd: soft, nontender, no hepatomegaly  Ext: no edema Musculoskeletal:  No deformities, BUE and BLE strength normal and equal Skin: warm and dry  Neuro:  CNs 2-12 intact, no focal abnormalities noted Psych:  Normal affect    EKG:   NSR , no ST or T wave changes   Relevant CV Studies:   Laboratory Data:  High Sensitivity Troponin:   Recent Labs  Lab 09/06/23 1719 09/13/23 0819 09/13/23 1050 09/13/23 1516 09/13/23 1716  TROPONINIHS 9 164* 144* 203* 220*      Chemistry Recent Labs  Lab 09/13/23 0819 09/14/23 0020  NA 141 142  K 3.4* 3.9  CL 103 104  CO2 31 24  GLUCOSE 109* 128*  BUN 10 11  CREATININE 0.87 1.02  CALCIUM 9.1 8.6*  GFRNONAA >60 >60  ANIONGAP 7 14    Recent Labs  Lab 09/13/23 0819  PROT 6.9  ALBUMIN  4.6  AST 34  ALT 47*  ALKPHOS 42  BILITOT 0.7   Lipids  Recent Labs  Lab 09/14/23 0020  CHOL 147  TRIG 142  HDL 40*  LDLCALC 79  CHOLHDL 3.7   Hematology Recent Labs  Lab 09/13/23 0819 09/14/23 0020  WBC 5.0 5.1  RBC 5.18 4.84  HGB 16.2 14.8  HCT 46.0 42.9  MCV 88.8 88.6  MCH 31.3 30.6  MCHC 35.2 34.5  RDW 12.8 12.7  PLT 202 180   Thyroid No results for input(s): "TSH", "FREET4" in the last 168 hours. BNPNo results for input(s): "BNP", "PROBNP" in the last 168 hours.  DDimer  Recent Labs  Lab 09/13/23 0819  DDIMER <0.27     Radiology/Studies:  DG Chest 2 View  Result Date: 09/13/2023 CLINICAL DATA:  Chest pain EXAM: CHEST - 2 VIEW COMPARISON:  09/06/2023. FINDINGS: Cardiac silhouette is unremarkable. No pneumothorax or pleural effusion. The lungs are clear. The visualized skeletal structures are unremarkable. IMPRESSION: No acute cardiopulmonary process. Electronically Signed   By: Layla Maw M.D.   On: 09/13/2023 09:04  Assessment and Plan:   Non-ST segment elevation myocardial infarction: Billy Andersen presents with stuttering chest pain for the past week or so.  His troponins were negative last week.  Today his troponin levels are mildly elevated. For cath today per Dr Elease Hashimoto.   Shared Decision Making/Informed Consent The risks [stroke (1 in 1000), death (1 in 1000), kidney failure [usually temporary] (1 in 500), bleeding (1 in 200), allergic reaction [possibly serious] (1 in 200)], benefits (diagnostic support and management of coronary artery disease) and alternatives of a cardiac catheterization were discussed in detail with Ms. Wanita Chamberlain and she is willing to proceed.   He understands and agrees to proceed.  2.  Probable hyperlipidemia: I do not see any lipid levels in our system.  He does not eat a healthy diet.  He he eats lots of processed foods.  We discussed the importance of eating fresh fruits and vegetables and lean meats.    Signed, Charlton Haws, MD  09/14/2023 7:49 AM

## 2023-09-14 NOTE — Progress Notes (Signed)
Patient extremely nervous, hence kept him overnight.  Chest pain symptoms appears clearly musculoskeletal, in spite of 4/10 chest pain, patient comfortable without EKG changes.  I reassured him.  Will watch overnight and discharge in the morning.

## 2023-09-15 ENCOUNTER — Inpatient Hospital Stay (HOSPITAL_COMMUNITY): Payer: Medicaid Other

## 2023-09-15 ENCOUNTER — Encounter (HOSPITAL_COMMUNITY): Payer: Self-pay | Admitting: Cardiology

## 2023-09-15 DIAGNOSIS — I2510 Atherosclerotic heart disease of native coronary artery without angina pectoris: Secondary | ICD-10-CM | POA: Insufficient documentation

## 2023-09-15 DIAGNOSIS — I214 Non-ST elevation (NSTEMI) myocardial infarction: Secondary | ICD-10-CM | POA: Insufficient documentation

## 2023-09-15 DIAGNOSIS — I200 Unstable angina: Secondary | ICD-10-CM | POA: Diagnosis not present

## 2023-09-15 LAB — BASIC METABOLIC PANEL
Anion gap: 10 (ref 5–15)
BUN: 8 mg/dL (ref 6–20)
CO2: 24 mmol/L (ref 22–32)
Calcium: 8.8 mg/dL — ABNORMAL LOW (ref 8.9–10.3)
Chloride: 103 mmol/L (ref 98–111)
Creatinine, Ser: 0.94 mg/dL (ref 0.61–1.24)
GFR, Estimated: >60 mL/min (ref >60)
Glucose, Bld: 105 mg/dL — ABNORMAL HIGH (ref 70–99)
Potassium: 3.7 mmol/L (ref 3.5–5.1)
Sodium: 137 mmol/L (ref 135–145)

## 2023-09-15 LAB — CBC
HCT: 45.7 % (ref 39.0–52.0)
Hemoglobin: 16.5 g/dL (ref 13.0–17.0)
MCH: 32.2 pg (ref 26.0–34.0)
MCHC: 36.1 g/dL — ABNORMAL HIGH (ref 30.0–36.0)
MCV: 89.3 fL (ref 80.0–100.0)
Platelets: 199 10*3/uL (ref 150–400)
RBC: 5.12 MIL/uL (ref 4.22–5.81)
RDW: 12.4 % (ref 11.5–15.5)
WBC: 6.1 10*3/uL (ref 4.0–10.5)
nRBC: 0 % (ref 0.0–0.2)

## 2023-09-15 LAB — HEPARIN LEVEL (UNFRACTIONATED): Heparin Unfractionated: <0.1 [IU]/mL — ABNORMAL LOW (ref 0.30–0.70)

## 2023-09-15 NOTE — Discharge Summary (Addendum)
Discharge Summary    Patient ID: Billy Andersen MRN: [id number]; DOB: 1976-01-14  Admit date: 09/13/2023 Discharge date: 09/15/2023  PCP:  Dois Davenport, MD   [facility] Providers Cardiologist:  Kristeen Miss, MD   {   Discharge Diagnoses    Active Problems:   Non-ST elevation (NSTEMI) myocardial infarction [facility])   CAD (coronary artery disease)    Diagnostic Studies/Procedures    Left Heart Catheterization 09/14/23:    Fluoro time: 5.9 (min) DAP: 28080 (mGycm2) Cumulative Air Kerma: 463 (mGy)  Hemodynamic data: LV 118/9, EDP 20 mmHg.  Ao 109/74, mean 90 mmHg.  No pressure gradient across the aortic valve.  Mildly elevated EDP.   Angiographic data: LV: Mild basal inferior hypokinesis, no significant mitral regurgitation. LM: Very large caliber vessel, small to normal. LAD: Very large-caliber vessel giving origin to a large D1.  It is smooth and normal.  LAD ends at the apex.  There is contralateral collaterals noted to the right coronary artery. LCx: Very large caliber vessel giving origin to 2 large marginal send very small AV groove circumflex.  Gives collaterals to distal right. RCA: Dominant.  Very large caliber vessel.  It is occluded in the proximal segment.   Intervention data: Successful PTCA and stenting of the proximal RCA with implantation of a 3.5 x 16 mm Synergy XD DES, stenosis reduced from 100% to 0% with TIMI 0 to TIMI-3 flow at the end of the procedure.    Recommendations: Patient will need aggressive risk factor modification especially in view of his very strong family history of premature coronary disease in his father, high intensity statins along with DAPT for 1 year, I have added low-dose beta-blocker therapy as well.  He can potentially be discharged home this afternoon if he is stable.       History of Present Illness      Per admission H&P on 09/13/23 by Dr Elease Hashimoto:  [name] is a relatively healthy 47 year old  gentleman who started having episodes of chest discomfort last week (before 09/13/23).  The onset was described as a chest pressure with radiation to the left side of his jaw.  It was associated with significant shortness of breath.  During the past week it was occurring with daily activities.  He has had stuttering chest pain for the past week and early morning on 09/13/23 he woke up with severe chest discomfort with radiation to the left jaw.  He found the symptoms concerning and presented to the [facility].  He was found to have mildly elevated troponin levels.   He had been started on IV nitroglycerin and was pain-free.   Does not report any hyperlipidemia.  He is a non-smoker.  His father had a history of coronary artery disease in his 98s.   There was no pleuritic component to the chest discomfort.   Hospital Course     Consultants: N/A    NSTEMI CAD - presented with exertional chest pain, SOB for a week - Hs trop 164 >144 >203 >220 - Hgb A1C 5.1%, LDL 79  - EKG no acute changes - Echo can be done outpatient per Dr Eden Emms to facilitate early discharge, please arrange at next visit  - LHC on 09/14/23: 100% prox RCA stenosis, collaterals from LAD and Lcx to distal RCA noted; s/p successful PTCA and stenting of the proximal RCA with DES. LVEDP elevated 20 mmHg.  - Medical therapy: start ASA 81mg  daily + Brilinta 90mg  BID for 1  year, start crestor 40mg  daily, metoprolol XL 25mg  daily. All new meds had been sent to [facility] pharmacy yesterday, verified with pharmacy today.  - Post cath care discussed in detail at bedside - Follow up arranged 10/02/23 at [facility] office, no sooner appointment in 2 weeks   Nonalcoholic steatohepatitis  Liver fibrosis  - followed by [facility] in 2021, LFTs WNL, will need routine monitor of LFT and fibroscan, advised return to hepatologist for routine follow up   Hx of secondary polycythemia - Hgb WNL, return to hematology if new concern   Bipolar  disorder ADHD - home meds unchanged     Did the patient have an acute coronary syndrome (MI, NSTEMI, STEMI, etc) this admission?:  Yes                               AHA/ACC ACS Clinical Performance & Quality Measures: Aspirin prescribed? - Yes ADP Receptor Inhibitor (Plavix/Clopidogrel, Brilinta/Ticagrelor or Effient/Prasugrel) prescribed (includes medically managed patients)? - Yes Beta Blocker prescribed? - Yes High Intensity Statin (Lipitor 40-80mg  or Crestor 20-40mg ) prescribed? - Yes EF assessed during THIS hospitalization? - No - Outpatient Echocardiogram will be scheduled to assess EF. For EF <40%, was ACEI/ARB prescribed? - No - Outpatient Echocardiogram to assess EF will be scheduled. For EF <40%, Aldosterone Antagonist (Spironolactone or Eplerenone) prescribed? - No - Outpatient Echocardiogram to assess EF will be scheduled. Cardiac Rehab Phase II ordered (including medically managed patients)? - Yes         _____________  Discharge Vitals Blood pressure 127/78, pulse 81, temperature 98.6 F (37 C), temperature source Oral, resp. rate 19, height 6\' 2"  (1.88 m), weight 109.8 kg, SpO2 96%.  Filed Weights   09/13/23 0900  Weight: 109.8 kg   Vitals:  Vitals:   09/15/23 0445 09/15/23 0846  BP: 124/63 127/78  Pulse: 67 81  Resp: 19   Temp: 98.6 F (37 C)   SpO2: 96%    General Appearance: In no apparent distress, laying in bed, well nourished, mild anxiety  HEENT: Normocephalic, atraumatic. Neck: Supple, trachea midline, no JVDs Cardiovascular: Regular rate and rhythm, normal S1-S2,  no murmur Respiratory: Resting breathing unlabored, lungs sounds clear to auscultation bilaterally, no use of accessory muscles. On room air.  No wheezes, rales or rhonchi.   Gastrointestinal: Bowel sounds positive, abdomen soft, non-tender, non-distended. No mass or organomegaly.  Extremities: Able to move all extremities in bed without difficulty, no edema of BLE Musculoskeletal:  Normal muscle bulk and tone Skin: Intact, warm, dry. No rashes. Right radial site with dressing, no erythema/tenderness/bleeding, no neurovascular deficit  Neurologic: Alert, oriented to person, place and time. no gross focal neuro deficit    Labs & Radiologic Studies    CBC Recent Labs    09/13/23 0819 09/14/23 0020 09/15/23 0537  WBC 5.0 5.1 6.1  NEUTROABS 2.8  --   --   HGB 16.2 14.8 16.5  HCT 46.0 42.9 45.7  MCV 88.8 88.6 89.3  PLT 202 180 199   Basic Metabolic Panel Recent Labs    78/46/96 0020 09/15/23 0537  NA 142 137  K 3.9 3.7  CL 104 103  CO2 24 24  GLUCOSE 128* 105*  BUN 11 8  CREATININE 1.02 0.94  CALCIUM 8.6* 8.8*   Liver Function Tests Recent Labs    09/13/23 0819  AST 34  ALT 47*  ALKPHOS 42  BILITOT 0.7  PROT 6.9  ALBUMIN 4.6  No results for input(s): "LIPASE", "AMYLASE" in the last 72 hours. High Sensitivity Troponin:   Recent Labs  Lab 09/06/23 1719 09/13/23 0819 09/13/23 1050 09/13/23 1516 09/13/23 1716  TROPONINIHS 9 164* 144* 203* 220*    BNP Invalid input(s): "POCBNP" D-Dimer Recent Labs    09/13/23 0819  DDIMER <0.27   Hemoglobin A1C Recent Labs    09/13/23 1516  HGBA1C 5.1   Fasting Lipid Panel Recent Labs    09/14/23 0020  CHOL 147  HDL 40*  LDLCALC 79  TRIG 161  CHOLHDL 3.7   Thyroid Function Tests No results for input(s): "TSH", "T4TOTAL", "T3FREE", "THYROIDAB" in the last 72 hours.  Invalid input(s): "FREET3" _____________  CARDIAC CATHETERIZATION  Result Date: 09/14/2023 Images from the original result were not included. Left Heart Catheterization 09/14/23: Hemodynamic data: LV 118/9, EDP 20 mmHg.  Ao 109/74, mean 90 mmHg.  No pressure gradient across the aortic valve.  Mildly elevated EDP. Angiographic data: LV: Mild basal inferior hypokinesis, no significant mitral regurgitation. LM: Very large caliber vessel, small to normal. LAD: Very large-caliber vessel giving origin to a large D1.  It is  smooth and normal.  LAD ends at the apex.  There is contralateral collaterals noted to the right coronary artery. LCx: Very large caliber vessel giving origin to 2 large marginal send very small AV groove circumflex.  Gives collaterals to distal right. RCA: Dominant.  Very large caliber vessel.  It is occluded in the proximal segment. Intervention data: Successful PTCA and stenting of the proximal RCA with implantation of a 3.5 x 16 mm Synergy XD DES, stenosis reduced from 100% to 0% with TIMI 0 to TIMI-3 flow at the end of the procedure. Recommendations: Patient will need aggressive risk factor modification especially in view of his very strong family history of premature coronary disease in his father, high intensity statins along with DAPT for 1 year, I have added low-dose beta-blocker therapy as well.  He can potentially be discharged home this afternoon if he is stable.   DG Chest 2 View  Result Date: 09/13/2023 CLINICAL DATA:  Chest pain EXAM: CHEST - 2 VIEW COMPARISON:  09/06/2023. FINDINGS: Cardiac silhouette is unremarkable. No pneumothorax or pleural effusion. The lungs are clear. The visualized skeletal structures are unremarkable. IMPRESSION: No acute cardiopulmonary process. Electronically Signed   By: Layla Maw M.D.   On: 09/13/2023 09:04   DG Chest 2 View  Result Date: 09/06/2023 CLINICAL DATA:  Chest pain. EXAM: CHEST - 2 VIEW COMPARISON:  None Available. FINDINGS: The cardiomediastinal contours are normal. Incidental azygous fissure. Pulmonary vasculature is normal. No consolidation, pleural effusion, or pneumothorax. No acute osseous abnormalities are seen. IMPRESSION: No acute chest findings or explanation for pain. Electronically Signed   By: Narda Rutherford M.D.   On: 09/06/2023 16:03   Disposition   Patient reports resolved chest pain, tolerated ambulation well, clinically well, he had lots of questions and slightly anxious today. All questions answered to satisfaction.  Medication change, follow up plan, post cath care discussed in detail at bedside. Echo can be arranged at next follow up appointment.    Follow-up Plans & Appointments     Follow-up Information     Beatrice Lecher, [name] Follow up on 10/02/2023.   Specialties: Cardiology, Physician Assistant Why: at 8:25am for your cardiology follow up appointment Contact information: [address] [place] [address] [phone]  Discharge Instructions     AMB Referral to Cardiac Rehabilitation - Phase II   Complete by: As directed    Diagnosis:  NSTEMI Coronary Stents     After initial evaluation and assessments completed: Virtual Based Care may be provided alone or in conjunction with Phase 2 Cardiac Rehab based on patient barriers.: Yes   Intensive Cardiac Rehabilitation (ICR) [facility] location only OR Traditional Cardiac Rehabilitation (TCR) *If criteria for ICR are not met will enroll in TCR [facility] only): Yes   Diet - low sodium heart healthy   Complete by: As directed    Discharge instructions   Complete by: As directed    PLEASE DO NOT MISS ANY DOSES OF YOUR ASPIRIN/BRILINTA/!!!!!   Also keep a log of you blood pressures and bring back to your follow up appt. Please call the office with any questions.   Patients taking blood thinners should generally stay away from medicines like ibuprofen, Advil, Motrin, naproxen, and Aleve due to risk of stomach bleeding. You may take Tylenol as directed or talk to your primary doctor about alternatives.  PLEASE ENSURE THAT YOU DO NOT RUN OUT OF YOUR ASPIRIN/BRILINTA. This medication is very important to remain on for at least one year. IF you have issues obtaining this medication due to cost please CALL the office 3-5 business days prior to running out in order to prevent missing doses of this medication.   PLEASE REMEMBER TO BRING ALL OF YOUR MEDICATIONS TO EACH OF YOUR FOLLOW-UP OFFICE VISITS.  PLEASE ATTEND ALL  SCHEDULED FOLLOW-UP APPOINTMENTS.   Activity: Increase activity slowly as tolerated. You may shower, but no soaking baths (or swimming) for 1 week. No driving for 24 hours. No lifting over 5 lbs for 1 week. No sexual activity for 1 week.   You May Return to Work: in 2 weeks  Wound Care: You may wash cath site gently with soap and water. Keep cath site clean and dry. If you notice pain, swelling, bleeding or pus at your cath site, please call [phone].   Radial Site Care  Refer to this sheet in the next few weeks. These instructions provide you with information on caring for yourself after your procedure. Your caregiver may also give you more specific instructions. Your treatment has been planned according to current medical practices, but problems sometimes occur. Call your caregiver if you have any problems or questions after your procedure.  HOME CARE INSTRUCTIONS You may shower the day after the procedure. Remove the bandage (dressing) and gently wash the site with plain soap and water. Gently pat the site dry.  Do not apply powder or lotion to the site.  Do not submerge the affected site in water for 3 to 5 days.  Inspect the site at least twice daily.  Do not flex or bend the affected arm for 24 hours.  No lifting over 5 pounds (2.3 kg) for 5 days after your procedure.  Do not drive home if you are discharged the same day of the procedure. Have someone else drive you.  You may drive 24 hours after the procedure unless otherwise instructed by your caregiver.   What to expect: Any bruising will usually fade within 1 to 2 weeks.  Blood that collects in the tissue (hematoma) may be painful to the touch. It should usually decrease in size and tenderness within 1 to 2 weeks.   SEEK IMMEDIATE MEDICAL CARE IF: You have unusual pain at the radial site.  You have redness, warmth,  swelling, or pain at the radial site.  You have drainage (other than a small amount of blood on the  dressing).  You have chills.  You have a fever or persistent symptoms for more than 72 hours.  You have a fever and your symptoms suddenly get worse.  Your arm becomes pale, cool, tingly, or numb.  You have heavy bleeding from the site. Hold pressure on the site.   Increase activity slowly   Complete by: As directed         Discharge Medications   Allergies as of 09/15/2023   No Known Allergies      Medication List     TAKE these medications    Aspirin Low Dose 81 MG chewable tablet Generic drug: aspirin Chew 1 tablet (81 mg total) by mouth daily.   atomoxetine 80 MG capsule Commonly known as: STRATTERA Take 80 mg by mouth daily.   Brilinta 90 MG Tabs tablet Generic drug: ticagrelor Take 1 tablet (90 mg total) by mouth 2 (two) times daily.   Caplyta 42 MG capsule Generic drug: lumateperone tosylate Take 42 mg by mouth at bedtime.   lamoTRIgine 150 MG tablet Commonly known as: LAMICTAL Take 150 mg by mouth at bedtime.   metoprolol succinate 25 MG 24 hr tablet Commonly known as: TOPROL-XL Take 1 tablet (25 mg total) by mouth daily. Take with or immediately following a meal.   nitroGLYCERIN 0.4 MG SL tablet Commonly known as: NITROSTAT Place 1 tablet (0.4 mg total) under the tongue every 5 (five) minutes x 3 doses as needed for chest pain.   rosuvastatin 40 MG tablet Commonly known as: CRESTOR Take 1 tablet (40 mg total) by mouth daily.           Outstanding Labs/Studies   N/A   Duration of Discharge Encounter   Greater than 30 minutes including physician time.  Signed, Cyndi Bender, NP 09/15/2023, 9:31 AM

## 2023-09-15 NOTE — Progress Notes (Signed)
CARDIAC REHAB PHASE I   PRE:  Rate/Rhythm: 80 SR      SaO2: 99 RA   MODE:  Ambulation: 240 ft   POST:  Rate/Rhythm: 100 ST                            SaO2: 98 RA   Pt ambulated independently in hallway, tolerating well with no CP,SOB or dizziness.  Post MI/stent education including restrictions, risk factors, exercise guidelines, antiplatelet therapy importance, MI booklet, NTG use, heart healthy diet and CRP2 reviewed. All questions and concerns addressed. Will refer to [facility] for CRP2. Plan for home later.   [id number] Woodroe Chen, RN BSN 09/15/2023 9:39 AM

## 2023-09-15 NOTE — Progress Notes (Signed)
Discharge instructions reviewed with pt. Pt verbalized understanding and able to teach back.  Copy of instructions given to pt. [facility] filled scripts and will be picked up on the way out for discharge. Pt will go to discharge lounge if ride is not at main entrance when taken down.  Pt to be d/c'd via wheelchair with belongings.             To be escorted by staff.   Annajulia Lewing,RN SWOT

## 2023-09-15 NOTE — TOC CM/SW Note (Signed)
Transition of Care [facility]) - Inpatient Brief Assessment   Patient Details  Name: Billy Andersen MRN: [id number] Date of Birth: 01-25-1976  Transition of Care [facility]) CM/SW Contact:    Gala Lewandowsky, RN Phone Number: 09/15/2023, 10:12 AM   Clinical Narrative: Patient presented for chest pain-post LHC. No home needs identified at this time. Plan for transition home today.     Transition of Care Asessment: Insurance and Status: Insurance coverage has been reviewed Patient has primary care physician: Yes Prior/Current Home Services: No current home services Social Determinants of Health Reivew: SDOH reviewed no interventions necessary Readmission risk has been reviewed: Yes Transition of care needs: no transition of care needs at this time

## 2023-09-16 LAB — LIPOPROTEIN A (LPA): Lipoprotein (a): <8.4 nmol/L (ref <75.0)

## 2023-09-21 ENCOUNTER — Emergency Department (HOSPITAL_COMMUNITY): Payer: Medicaid Other

## 2023-09-21 ENCOUNTER — Other Ambulatory Visit: Payer: Self-pay

## 2023-09-21 ENCOUNTER — Telehealth: Payer: Self-pay | Admitting: Cardiovascular Disease

## 2023-09-21 ENCOUNTER — Observation Stay (HOSPITAL_COMMUNITY)
Admission: EM | Admit: 2023-09-21 | Discharge: 2023-09-22 | Disposition: A | Discharge status: Home / Self Care | Payer: Medicaid Other | Attending: Internal Medicine | Admitting: Internal Medicine

## 2023-09-21 DIAGNOSIS — R079 Chest pain, unspecified: Principal | ICD-10-CM | POA: Diagnosis present

## 2023-09-21 DIAGNOSIS — R0600 Dyspnea, unspecified: Secondary | ICD-10-CM | POA: Diagnosis present

## 2023-09-21 DIAGNOSIS — E7800 Pure hypercholesterolemia, unspecified: Secondary | ICD-10-CM

## 2023-09-21 DIAGNOSIS — I2510 Atherosclerotic heart disease of native coronary artery without angina pectoris: Secondary | ICD-10-CM | POA: Insufficient documentation

## 2023-09-21 DIAGNOSIS — Z79899 Other long term (current) drug therapy: Secondary | ICD-10-CM | POA: Insufficient documentation

## 2023-09-21 DIAGNOSIS — Z7982 Long term (current) use of aspirin: Secondary | ICD-10-CM | POA: Insufficient documentation

## 2023-09-21 LAB — BASIC METABOLIC PANEL
Anion gap: 10 (ref 5–15)
BUN: 12 mg/dL (ref 6–20)
CO2: 25 mmol/L (ref 22–32)
Calcium: 9.1 mg/dL (ref 8.9–10.3)
Chloride: 102 mmol/L (ref 98–111)
Creatinine, Ser: 1.06 mg/dL (ref 0.61–1.24)
GFR, Estimated: >60 mL/min (ref >60)
Glucose, Bld: 96 mg/dL (ref 70–99)
Potassium: 4.1 mmol/L (ref 3.5–5.1)
Sodium: 137 mmol/L (ref 135–145)

## 2023-09-21 LAB — CBC
HCT: 46.9 % (ref 39.0–52.0)
Hemoglobin: 16.4 g/dL (ref 13.0–17.0)
MCH: 31.6 pg (ref 26.0–34.0)
MCHC: 35 g/dL (ref 30.0–36.0)
MCV: 90.4 fL (ref 80.0–100.0)
Platelets: 230 10*3/uL (ref 150–400)
RBC: 5.19 MIL/uL (ref 4.22–5.81)
RDW: 12.2 % (ref 11.5–15.5)
WBC: 6.6 10*3/uL (ref 4.0–10.5)
nRBC: 0 % (ref 0.0–0.2)

## 2023-09-21 LAB — TROPONIN I (HIGH SENSITIVITY)
Troponin I (High Sensitivity): 5 ng/L (ref <18)
Troponin I (High Sensitivity): 5 ng/L (ref <18)

## 2023-09-21 MED ORDER — LAMOTRIGINE 25 MG PO TABS: 150.0000 mg | ORAL_TABLET | Freq: Every day | ORAL | Status: DC

## 2023-09-21 MED ORDER — COLCHICINE 0.6 MG PO TABS
0.6000 mg | ORAL_TABLET | Freq: Two times a day (BID) | ORAL | Status: DC
Start: 2023-09-22 — End: 2023-09-22

## 2023-09-21 MED ORDER — CLOPIDOGREL BISULFATE 75 MG PO TABS: 75.0000 mg | ORAL_TABLET | Freq: Every day | ORAL | Status: DC

## 2023-09-21 MED ORDER — ACETAMINOPHEN 325 MG PO TABS
650.0000 mg | ORAL_TABLET | ORAL | Status: DC | PRN
Start: 2023-09-21 — End: 2023-09-22

## 2023-09-21 MED ORDER — ATOMOXETINE HCL 40 MG PO CAPS
80.0000 mg | ORAL_CAPSULE | Freq: Every day | ORAL | Status: DC
  Filled 2023-09-21: qty 2

## 2023-09-21 MED ORDER — LUMATEPERONE TOSYLATE 42 MG PO CAPS
42.0000 mg | ORAL_CAPSULE | Freq: Every day | ORAL | Status: DC
  Filled 2023-09-21: qty 1

## 2023-09-21 MED ORDER — METOPROLOL SUCCINATE ER 25 MG PO TB24
25.0000 mg | ORAL_TABLET | Freq: Every day | ORAL | Status: DC
  Administered 2023-09-22: 25 mg via ORAL
  Filled 2023-09-21: qty 1

## 2023-09-21 MED ORDER — ROSUVASTATIN CALCIUM 20 MG PO TABS
40.0000 mg | ORAL_TABLET | Freq: Every day | ORAL | Status: DC
  Administered 2023-09-22: 40 mg via ORAL
  Filled 2023-09-21: qty 2

## 2023-09-21 MED ORDER — CLOPIDOGREL BISULFATE 300 MG PO TABS
300.0000 mg | ORAL_TABLET | Freq: Once | ORAL | Status: AC
  Administered 2023-09-22: 300 mg via ORAL
  Filled 2023-09-21: qty 1

## 2023-09-21 MED ORDER — NITROGLYCERIN 0.4 MG SL SUBL
0.4000 mg | SUBLINGUAL_TABLET | SUBLINGUAL | Status: DC | PRN
  Administered 2023-09-21 (×2): 0.4 mg via SUBLINGUAL
  Filled 2023-09-21: qty 1

## 2023-09-21 MED ORDER — ASPIRIN 81 MG PO CHEW: 81.0000 mg | CHEWABLE_TABLET | Freq: Every day | ORAL | Status: DC

## 2023-09-21 NOTE — ED Triage Notes (Signed)
Pt to ED c/o chest pain x 1 week , intermittent in nature, Reports pain radiates at times down left arm. Reports constant SHOB x 2 weeks.   Of note, pt was D/C from Hospital on 10/15. Pt was dx with NSTEMI, went to cath lab, pt reports 1 stent was placed.   NAD noted in triage.

## 2023-09-21 NOTE — Telephone Encounter (Signed)
RN spoke with Dr Excell Seltzer who advises pt change from Brilinta to Plavix to see if this helps with pt's symptoms.  Pt would take his last dose of Brilinta tonight and then begin with a loading dose of Plavix 600mg  tomorrow morning 09/22/2023.  Starting on 10/23 pt would begin Plavix 75mg  daily.

## 2023-09-21 NOTE — ED Notes (Signed)
Cardiology at bedside.

## 2023-09-21 NOTE — Telephone Encounter (Signed)
Spoke with pt who complains of SOB on exertion and some intermittent left sided chest and arm discomfort since discharge from the hospital on 09/15/2023 after NSTEMI and with heart cath and stenting.  Pt's cath site was his right wrist.  Pt reports he has been taking medications as prescribed.  He has not tried any nitroglycerin at this point to see if symptoms improved.  Discussed with pt the possibility of Brilinta causing his SOB.  Pt denies symptoms of CP at this time.  Pt advised will discuss with Dr Excell Seltzer, DOD and will call him back.  Reveiwed ED precautions.  Pt verbalizes understanding and agrees with current plan.

## 2023-09-21 NOTE — Telephone Encounter (Signed)
Pt c/o Shortness Of Breath: STAT if SOB developed within the last 24 hours or pt is noticeably SOB on the phone  1. Are you currently SOB (can you hear that pt is SOB on the phone)? Yes   2. How long have you been experiencing SOB? Since discharge   3. Are you SOB when sitting or when up moving around? Both   4. Are you currently experiencing any other symptoms? CP, left arm pain

## 2023-09-21 NOTE — ED Notes (Signed)
Report received from [facility]. Assumed care of pt at this time.

## 2023-09-21 NOTE — Consult Note (Signed)
Cardiology Admission History and Physical   Patient ID: Billy Andersen MRN: [id number]; DOB: 01/09/1976   Admission date: 09/21/2023  PCP:  Oneita Hurt No   [facility] Providers Cardiologist:  Kristeen Miss, MD        Chief Complaint:  Chest Pain  Patient Profile:   Billy Andersen is a 47 y.o. male with recent RCA NSTEMI s/p proximal RCA PCI who is being seen 09/21/2023 for the evaluation of chest pain and dyspnea.  History of Present Illness:   Billy Andersen presents with dyspnea with minimal exertion and progressive chest discomfort. He reports that since discharge roughly one week ago from RCA PCI he has persistent dyspnea with any exertion. He notices this with standing up to walk to the restroom. He has some dyspnea when he initially lays down for bed which resolves with continued recumbence. He denies any PND or new LE edema.   He also has two types of chest pain. The first of which is sharp and pleuritic in nature and has been present since discharge. There is no positional component to this pain. The second type of pain is progressive with increased frequency of dull left sided chest pain that occurs randomly and is not reliably exacerbated with exertion. These episodes are occurring more frequently and is now radiating to his left arm and jaw. These symptoms are similar to the those he presented with last week. He reports adherence to DAPT therapy with no missed doses of medication.  He was admitted one week ago with left sided chest, arm, and jaw pain. Troponin was elevated to 200. Cath with 100% proximal RCA with collaterals from left to right and LVEDP of 20 mmHg. PCI was successful and he was discharged on ASA and Ticagrelor. He was discharged with plan for outpatient Echocardiogram which has not been completed.   Past Medical History:  Diagnosis Date   ADHD    Bipolar 1 disorder (HCC)     Past Surgical History:  Procedure Laterality Date   CORONARY  STENT INTERVENTION N/A 09/14/2023   Procedure: CORONARY STENT INTERVENTION;  Surgeon: Yates Decamp, MD;  Location: [facility];  Service: Cardiovascular;  Laterality: N/A;   EXTERNAL EAR SURGERY     LEFT HEART CATH AND CORONARY ANGIOGRAPHY N/A 09/14/2023   Procedure: LEFT HEART CATH AND CORONARY ANGIOGRAPHY;  Surgeon: Yates Decamp, MD;  Location: [facility];  Service: Cardiovascular;  Laterality: N/A;     Medications Prior to Admission: Prior to Admission medications   Medication Sig Start Date End Date Taking? Authorizing Provider  aspirin 81 MG chewable tablet Chew 1 tablet (81 mg total) by mouth daily. 09/14/23   Yates Decamp, MD  atomoxetine (STRATTERA) 80 MG capsule Take 80 mg by mouth daily. 09/01/23   [provider]  CAPLYTA 42 MG capsule Take 42 mg by mouth at bedtime. 09/01/23   [provider]  lamoTRIgine (LAMICTAL) 150 MG tablet Take 150 mg by mouth at bedtime. 09/01/23   [provider]  metoprolol succinate (TOPROL-XL) 25 MG 24 hr tablet Take 1 tablet (25 mg total) by mouth daily. Take with or immediately following a meal. 09/14/23 12/13/23  Yates Decamp, MD  nitroGLYCERIN (NITROSTAT) 0.4 MG SL tablet Place 1 tablet (0.4 mg total) under the tongue every 5 (five) minutes x 3 doses as needed for chest pain. 09/14/23   Yates Decamp, MD  rosuvastatin (CRESTOR) 40 MG tablet Take 1 tablet (40 mg total) by mouth daily. 09/15/23  Yates Decamp, MD  ticagrelor (BRILINTA) 90 MG TABS tablet Take 1 tablet (90 mg total) by mouth 2 (two) times daily. 09/14/23   Yates Decamp, MD     Allergies:   No Known Allergies  Social History:   Social History   Socioeconomic History   Marital status: Married    Spouse name: Not on file   Number of children: Not on file   Years of education: Not on file   Highest education level: Not on file  Occupational History   Not on file  Tobacco Use   Smoking status: Never   Smokeless tobacco: Never  Vaping Use   Vaping status:  Never Used  Substance and Sexual Activity   Alcohol use: Yes   Drug use: Never   Sexual activity: Not on file  Other Topics Concern   Not on file  Social History Narrative   ** Merged History Encounter **       Social Determinants of Health   Financial Resource Strain: Not on file  Food Insecurity: No Food Insecurity (09/13/2023)   Hunger Vital Sign    Worried About Running Out of Food in the Last Year: Never true    Ran Out of Food in the Last Year: Never true  Transportation Needs: No Transportation Needs (09/13/2023)   PRAPARE - Administrator, Civil Service (Medical): No    Lack of Transportation (Non-Medical): No  Physical Activity: Not on file  Stress: Not on file  Social Connections: Unknown (09/09/2023)   Received from [facility]   Social Network    Social Network: Not on file  Intimate Partner Violence: Not At Risk (09/13/2023)   Humiliation, Afraid, Rape, and Kick questionnaire    Fear of Current or Ex-Partner: No    Emotionally Abused: No    Physically Abused: No    Sexually Abused: No    Family History:   The patient's family history includes Gallbladder disease (age of onset: 70) in his mother; Heart disease (age of onset: 22) in his father; Rectal cancer in his mother.    ROS:  Please see the history of present illness.  All other ROS reviewed and negative.     Physical Exam/Data:   Vitals:   09/21/23 1843 09/21/23 1844 09/21/23 2105  BP: 124/81  103/62  Pulse: 79  68  Resp: 18  (!) 21  Temp: 98.2 F (36.8 C)    TempSrc: Oral    SpO2: 100%  97%  Weight:  106.6 kg   Height:  6\' 2"  (1.88 m)    No intake or output data in the 24 hours ending 09/21/23 2224    09/21/2023    6:44 PM 09/13/2023    9:00 AM 09/06/2023    2:43 PM  Last 3 Weights  Weight (lbs) 235 lb 242 lb 1 oz 242 lb  Weight (kg) 106.595 kg 109.8 kg 109.77 kg     Body mass index is 30.17 kg/m.  General:  Well nourished, well developed, in no acute distress HEENT:  normal Neck: no JVD Vascular: No carotid bruits; Distal pulses 2+ bilaterally   Cardiac:  normal S1, S2; RRR; no murmur  Lungs:  clear to auscultation bilaterally, no wheezing, rhonchi or rales  Abd: soft, nontender, no hepatomegaly  Ext: no edema Musculoskeletal:  No deformities, BUE and BLE strength normal and equal Skin: warm and dry  Neuro:  CNs 2-12 intact, no focal abnormalities noted Psych:  Normal affect    EKG:  The ECG that was done and was personally reviewed and demonstrates NSR  Relevant CV Studies: Left Heart Catheterization 09/14/23:  Hemodynamic data: LV 118/9, EDP 20 mmHg.  Ao 109/74, mean 90 mmHg.  No pressure gradient across the aortic valve.  Mildly elevated EDP.   Angiographic data: LV: Mild basal inferior hypokinesis, no significant mitral regurgitation. LM: Very large caliber vessel, small to normal. LAD: Very large-caliber vessel giving origin to a large D1.  It is smooth and normal.  LAD ends at the apex.  There is contralateral collaterals noted to the right coronary artery. LCx: Very large caliber vessel giving origin to 2 large marginal send very small AV groove circumflex.  Gives collaterals to distal right. RCA: Dominant.  Very large caliber vessel.  It is occluded in the proximal segment.   Intervention data: Successful PTCA and stenting of the proximal RCA with implantation of a 3.5 x 16 mm Synergy XD DES, stenosis reduced from 100% to 0% with TIMI 0 to TIMI-3 flow at the end of the procedure.  Laboratory Data:  High Sensitivity Troponin:   Recent Labs  Lab 09/13/23 1050 09/13/23 1516 09/13/23 1716 09/21/23 1845 09/21/23 2051  TROPONINIHS 144* 203* 220* 5 5      Chemistry Recent Labs  Lab 09/15/23 0537 09/21/23 1845  NA 137 137  K 3.7 4.1  CL 103 102  CO2 24 25  GLUCOSE 105* 96  BUN 8 12  CREATININE 0.94 1.06  CALCIUM 8.8* 9.1  GFRNONAA >60 >60  ANIONGAP 10 10    No results for input(s): "PROT", "ALBUMIN", "AST", "ALT",  "ALKPHOS", "BILITOT" in the last 168 hours. Lipids No results for input(s): "CHOL", "TRIG", "HDL", "LABVLDL", "LDLCALC", "CHOLHDL" in the last 168 hours. Hematology Recent Labs  Lab 09/15/23 0537 09/21/23 1845  WBC 6.1 6.6  RBC 5.12 5.19  HGB 16.5 16.4  HCT 45.7 46.9  MCV 89.3 90.4  MCH 32.2 31.6  MCHC 36.1* 35.0  RDW 12.4 12.2  PLT 199 230   Thyroid No results for input(s): "TSH", "FREET4" in the last 168 hours. BNPNo results for input(s): "BNP", "PROBNP" in the last 168 hours.  DDimer No results for input(s): "DDIMER" in the last 168 hours.   Radiology/Studies:  No results found.   Assessment and Plan:   Chest pain - Two distinct types of pain he describes one of which is sharp and pleuritic which is likely pericardial in etiology. For this we will start colchicine. The second type is dull, intermittent, not reliably reproduced with exertion but has progressed since his PCI and is now radiating to the left arm and jaw. His troponin is normal and ECG is reassuring but the progressive nature and the similarity to his MI symptoms is concerning enough to warrant a re-cath. No Echo has been completed since PCI.  - Colchicine BID  - NPO at midnight for cath  - Echo Dyspnea - Occurs with minimal exertion. Reports initial dyspnea when he lays down that resolves with continued recumbency. No PND, LE edema, JVD, or rales. CXR without pleural effusion or pulmonary edema. I suspect this is an adverse reaction to Ticagrelor or cardiac dysfunction (Echo pending).  - Stop Brillinta  - Clopidogrel 300 mg loading dose in AM (12 hours after last Ticagrelor dose)  - Clopidogrel 75 mg daily thereafter  - BNP pending   Risk Assessment/Risk Scores:    TIMI Risk Score for Unstable Angina or Non-ST Elevation MI:   The patient's TIMI risk score is 3,  which indicates a 13% risk of all cause mortality, new or recurrent myocardial infarction or need for urgent revascularization in the next 14  days.  New York Heart Association (NYHA) Functional Class NYHA Class III    Code Status: Full Code  Severity of Illness: The appropriate patient status for this patient is OBSERVATION. Observation status is judged to be reasonable and necessary in order to provide the required intensity of service to ensure the patient's safety. The patient's presenting symptoms, physical exam findings, and initial radiographic and laboratory data in the context of their medical condition is felt to place them at decreased risk for further clinical deterioration. Furthermore, it is anticipated that the patient will be medically stable for discharge from the hospital within 2 midnights of admission.    For questions or updates, please contact [facility] Please consult [email or web address] for contact info under     Signed, Roderic Palau, MD  09/21/2023 10:24 PM

## 2023-09-21 NOTE — ED Provider Triage Note (Signed)
Emergency Medicine Provider Triage Evaluation Note  Billy Andersen , a 47 y.o. male  was evaluated in triage.  Pt complains of SOB since PCI intervention 7 days ago. Getting progressively worse through the week, today with CP when taking a deep breath and leaning forward. The pain does radiate into the left arm and left jaw.   Review of Systems  Positive: CP, SOB Negative: Fever, vomiting  Physical Exam  BP 124/81 (BP Location: Right Arm)   Pulse 79   Temp 98.2 F (36.8 C) (Oral)   Resp 18   Ht 6\' 2"  (1.88 m)   Wt 106.6 kg   SpO2 100%   BMI 30.17 kg/m  Gen:   Awake, no distress   Resp:  Normal effort Lungs clear MSK:   Moves extremities without difficulty  Other:  RRR, no murmur  Medical Decision Making  Medically screening exam initiated at 7:08 PM.  Appropriate orders placed.  Billy Andersen was informed that the remainder of the evaluation will be completed by another provider, this initial triage assessment does not replace that evaluation, and the importance of remaining in the ED until their evaluation is complete.  Anticipate cardiology consultation   Billy Andersen, Cordelia Poche 09/21/23 1910

## 2023-09-21 NOTE — Telephone Encounter (Signed)
Spoke with pt again and advised of recommendation per Dr Excell Seltzer to change from Brilinta to Plavix.  Pt states he has decided to go to the ED for further evaluation tonight.  Pt advised with this decision no changes should be made at this time to his medications.  Pt should plan to keep follow up appointment as scheduled.  Pt verbalizes understanding and thanked Charity fundraiser for the call.

## 2023-09-21 NOTE — ED Provider Notes (Signed)
EMERGENCY DEPARTMENT AT [facility] Provider Note   CSN: [id number] Arrival date & time: 09/21/23  1825     History  Chief Complaint  Patient presents with   Chest Pain   Shortness of Breath    Billy Andersen is a 47 y.o. male with past medical history for ADHD, bipolar, CAD, recent NSTEMI, recently admitted and discharged on 10/15 around 1 week ago.  Reports intermittent chest pain for 1 week, radiates at times down left arm, endorses constant shortness of breath for 2 weeks.   Chest Pain Associated symptoms: shortness of breath   Shortness of Breath Associated symptoms: chest pain        Home Medications Prior to Admission medications   Medication Sig Start Date End Date Taking? Authorizing Provider  aspirin 81 MG chewable tablet Chew 1 tablet (81 mg total) by mouth daily. 09/14/23  Yes Yates Decamp, MD  atomoxetine (STRATTERA) 80 MG capsule Take 80 mg by mouth daily. 09/01/23  Yes [provider]  CAPLYTA 42 MG capsule Take 42 mg by mouth at bedtime. 09/01/23  Yes [provider]  lamoTRIgine (LAMICTAL) 150 MG tablet Take 150 mg by mouth at bedtime. 09/01/23  Yes [provider]  metoprolol succinate (TOPROL-XL) 25 MG 24 hr tablet Take 1 tablet (25 mg total) by mouth daily. Take with or immediately following a meal. 09/14/23 12/13/23 Yes Yates Decamp, MD  Misc Natural Products (AIRBORNE ELDERBERRY) 100-50 MG CHEW Chew 2 tablets by mouth daily.   Yes [provider]  nitroGLYCERIN (NITROSTAT) 0.4 MG SL tablet Place 1 tablet (0.4 mg total) under the tongue every 5 (five) minutes x 3 doses as needed for chest pain. 09/14/23  Yes Yates Decamp, MD  Omega Fatty Acids-Vitamins (OMEGA-3 GUMMIES) CHEW Chew 2 tablets by mouth daily. VitaFusion   Yes [provider]  rosuvastatin (CRESTOR) 40 MG tablet Take 1 tablet (40 mg total) by mouth daily. 09/15/23  Yes Yates Decamp, MD  ticagrelor (BRILINTA) 90 MG TABS tablet Take 1  tablet (90 mg total) by mouth 2 (two) times daily. 09/14/23  Yes Yates Decamp, MD      Allergies    Patient has no known allergies.    Review of Systems   Review of Systems  Respiratory:  Positive for shortness of breath.   Cardiovascular:  Positive for chest pain.  All other systems reviewed and are negative.   Physical Exam Updated Vital Signs BP 103/62   Pulse 68   Temp 98.2 F (36.8 C) (Oral)   Resp (!) 21   Ht 6\' 2"  (1.88 m)   Wt 106.6 kg   SpO2 97%   BMI 30.17 kg/m  Physical Exam Vitals and nursing note reviewed.  Constitutional:      General: He is not in acute distress.    Appearance: Normal appearance.  HENT:     Head: Normocephalic and atraumatic.  Eyes:     General:        Right eye: No discharge.        Left eye: No discharge.  Cardiovascular:     Rate and Rhythm: Normal rate and regular rhythm.     Heart sounds: No murmur heard.    No friction rub. No gallop.  Pulmonary:     Effort: Pulmonary effort is normal.     Breath sounds: Normal breath sounds.  Abdominal:     General: Bowel sounds are normal.     Palpations: Abdomen is soft.  Skin:    General: Skin is warm and dry.     Capillary Refill: Capillary refill takes less than 2 seconds.  Neurological:     Mental Status: He is alert and oriented to person, place, and time.  Psychiatric:        Mood and Affect: Mood normal.        Behavior: Behavior normal.     ED Results / Procedures / Treatments   Labs (all labs ordered are listed, but only abnormal results are displayed) Labs Reviewed  BASIC METABOLIC PANEL  CBC  BRAIN NATRIURETIC PEPTIDE  TROPONIN I (HIGH SENSITIVITY)  TROPONIN I (HIGH SENSITIVITY)    EKG EKG Interpretation Date/Time:  Monday September 21 2023 18:56:35 EDT Ventricular Rate:  80 PR Interval:  122 QRS Duration:  92 QT Interval:  386 QTC Calculation: 445 R Axis:   62  Text Interpretation: Normal sinus rhythm Normal ECG When compared with ECG of 15-Sep-2023 04:49,  No significant change since last tracing Confirmed by Benjiman Core [phone]) on 09/21/2023 7:07:41 PM  Radiology No results found.  Procedures Procedures    Medications Ordered in ED Medications  nitroGLYCERIN (NITROSTAT) SL tablet 0.4 mg (0.4 mg Sublingual Given 09/21/23 2104)  aspirin chewable tablet 81 mg (has no administration in time range)  metoprolol succinate (TOPROL-XL) 24 hr tablet 25 mg (has no administration in time range)  rosuvastatin (CRESTOR) tablet 40 mg (has no administration in time range)  atomoxetine (STRATTERA) capsule 80 mg (has no administration in time range)  lumateperone tosylate (CAPLYTA) capsule 42 mg (has no administration in time range)  lamoTRIgine (LAMICTAL) tablet 150 mg (has no administration in time range)  acetaminophen (TYLENOL) tablet 650 mg (has no administration in time range)  clopidogrel (PLAVIX) tablet 300 mg (has no administration in time range)  clopidogrel (PLAVIX) tablet 75 mg (has no administration in time range)  colchicine tablet 0.6 mg (has no administration in time range)    ED Course/ Medical Decision Making/ A&P Clinical Course as of 09/21/23 2228  Mon Sep 21, 2023  2000 NSTEMI, stent placement, discharge from hospital on 10/15. Reports chest pain for 1 week, even at rest. Reports persistent 1-2 / 10, worse at times. Sharp, stabbing. Radiating to left arm, elbow, left jaw pain. Endorses shob as well.  [CP]    Clinical Course User Index [CP] Olene Floss, [name]                                 Medical Decision Making Amount and/or Complexity of Data Reviewed Labs: ordered. Radiology: ordered.  Risk Prescription drug management. Decision regarding hospitalization.   This patient is a 47 y.o. male who presents to the ED for concern of chest pain, this involves an extensive number of treatment options, and is a complaint that carries with it a high risk of complications and morbidity. The emergent differential  diagnosis prior to evaluation includes, but is not limited to,  ACS, AAS, PE, Mallory-Weiss, Boerhaave's, Pneumonia, acute bronchitis, asthma or COPD exacerbation, anxiety, MSK pain or traumatic injury to the chest, acid reflux versus other . This is not an exhaustive differential.   Past Medical History / Co-morbidities / Social History: CAD, nstemi, recent stent placement  Additional history: Chart reviewed. Pertinent results include: extensively reviewed hospital admission, recent stent placement. 100% RCA stenosis at time of admission.     Physical Exam: Physical exam performed. The pertinent findings include: Vital  signs stable, no fever, normotensive blood pressure, normal respiratory rate, stable oxygen saturation on room air.  Lab Tests: I ordered, and personally interpreted labs.  The pertinent results include: CBC unremarkable, BMP unremarkable, initial troponin normal at 5   Imaging Studies: I ordered imaging studies including plain film chest x-ray. I independently visualized and interpreted imaging which showed no evidence of thoracic abnormality.  Radiology interpretation pending at this time.   Cardiac Monitoring:  The patient was maintained on a cardiac monitor.  My attending physician Dr. Rush Landmark viewed and interpreted the cardiac monitored which showed an underlying rhythm of: NSR no significant change from last tracing I agree with this interpretation.   Medications: I ordered medication including nitroglycerin for chest pain  Consultations Obtained: I requested consultation with the cardiologist, spoke with Dr. Elayne Guerin,  and discussed lab and imaging findings as well as pertinent plan - they recommend: Admission in context of left-sided chest pain with recent stent placement for this 1 week ago   Disposition: After consideration of the diagnostic results and the patients response to treatment, I feel that patient would benefit from hospital admission with plan as  above directly to cardiology  I discussed this case with my attending physician Dr. Rush Landmark who cosigned this note including patient's presenting symptoms, physical exam, and planned diagnostics and interventions. Attending physician stated agreement with plan or made changes to plan which were implemented.    Final Clinical Impression(s) / ED Diagnoses Final diagnoses:  Chest pain, unspecified type    Rx / DC Orders ED Discharge Orders     None         [place] 09/21/23 2228    Tegeler, Canary Brim, MD 09/21/23 [phone]

## 2023-09-22 ENCOUNTER — Observation Stay (HOSPITAL_BASED_OUTPATIENT_CLINIC_OR_DEPARTMENT_OTHER): Payer: Medicaid Other

## 2023-09-22 ENCOUNTER — Encounter (HOSPITAL_COMMUNITY)
Admission: EM | Disposition: A | Discharge status: Home / Self Care | Payer: Self-pay | Source: Home / Self Care | Attending: Emergency Medicine

## 2023-09-22 ENCOUNTER — Encounter (HOSPITAL_COMMUNITY): Payer: Self-pay | Admitting: Internal Medicine

## 2023-09-22 ENCOUNTER — Other Ambulatory Visit (HOSPITAL_COMMUNITY): Payer: Self-pay

## 2023-09-22 DIAGNOSIS — R0609 Other forms of dyspnea: Secondary | ICD-10-CM

## 2023-09-22 DIAGNOSIS — R079 Chest pain, unspecified: Secondary | ICD-10-CM

## 2023-09-22 DIAGNOSIS — I25118 Atherosclerotic heart disease of native coronary artery with other forms of angina pectoris: Secondary | ICD-10-CM | POA: Diagnosis not present

## 2023-09-22 DIAGNOSIS — E7800 Pure hypercholesterolemia, unspecified: Secondary | ICD-10-CM

## 2023-09-22 HISTORY — PX: LEFT HEART CATH AND CORONARY ANGIOGRAPHY: CATH118249

## 2023-09-22 LAB — BASIC METABOLIC PANEL
Anion gap: 9 (ref 5–15)
BUN: 11 mg/dL (ref 6–20)
CO2: 26 mmol/L (ref 22–32)
Calcium: 8.8 mg/dL — ABNORMAL LOW (ref 8.9–10.3)
Chloride: 104 mmol/L (ref 98–111)
Creatinine, Ser: 0.98 mg/dL (ref 0.61–1.24)
GFR, Estimated: >60 mL/min (ref >60)
Glucose, Bld: 94 mg/dL (ref 70–99)
Potassium: 4 mmol/L (ref 3.5–5.1)
Sodium: 139 mmol/L (ref 135–145)

## 2023-09-22 LAB — BRAIN NATRIURETIC PEPTIDE: B Natriuretic Peptide: 9.7 pg/mL (ref 0.0–100.0)

## 2023-09-22 LAB — ECHOCARDIOGRAM COMPLETE
AR max vel: 2.38 cm2
AV Area VTI: 2.35 cm2
AV Area mean vel: 2.51 cm2
AV Mean grad: 5 mm[Hg]
AV Peak grad: 9.9 mm[Hg]
Ao pk vel: 1.57 m/s
Area-P 1/2: 2.63 cm2
Height: 74 in
S' Lateral: 3.2 cm
Weight: 3760 [oz_av]

## 2023-09-22 LAB — CBC
HCT: 44.7 % (ref 39.0–52.0)
Hemoglobin: 15.6 g/dL (ref 13.0–17.0)
MCH: 31.1 pg (ref 26.0–34.0)
MCHC: 34.9 g/dL (ref 30.0–36.0)
MCV: 89 fL (ref 80.0–100.0)
Platelets: 200 10*3/uL (ref 150–400)
RBC: 5.02 MIL/uL (ref 4.22–5.81)
RDW: 12.4 % (ref 11.5–15.5)
WBC: 5 10*3/uL (ref 4.0–10.5)
nRBC: 0 % (ref 0.0–0.2)

## 2023-09-22 LAB — SEDIMENTATION RATE: Sed Rate: 1 mm/h (ref 0–16)

## 2023-09-22 SURGERY — LEFT HEART CATH AND CORONARY ANGIOGRAPHY
Anesthesia: LOCAL

## 2023-09-22 MED ORDER — HEPARIN SODIUM (PORCINE) 1000 UNIT/ML IJ SOLN
INTRAMUSCULAR | Status: DC | PRN
  Administered 2023-09-22: 5000 [IU] via INTRAVENOUS

## 2023-09-22 MED ORDER — VERAPAMIL HCL 2.5 MG/ML IV SOLN
INTRAVENOUS | Status: DC | PRN
  Administered 2023-09-22: 5 mL via INTRA_ARTERIAL

## 2023-09-22 MED ORDER — COLCHICINE 0.6 MG PO TABS: 0.6000 mg | ORAL_TABLET | Freq: Two times a day (BID) | ORAL | Status: DC

## 2023-09-22 MED ORDER — HYDRALAZINE HCL 20 MG/ML IJ SOLN: 10.0000 mg | INTRAMUSCULAR | Status: DC | PRN

## 2023-09-22 MED ORDER — FENTANYL CITRATE (PF) 100 MCG/2ML IJ SOLN
INTRAMUSCULAR | Status: DC | PRN
  Administered 2023-09-22: 50 ug via INTRAVENOUS

## 2023-09-22 MED ORDER — COLCHICINE 0.6 MG PO TABS
1.2000 mg | ORAL_TABLET | Freq: Two times a day (BID) | ORAL | Status: DC
  Administered 2023-09-22: 1.2 mg via ORAL
  Filled 2023-09-22: qty 2

## 2023-09-22 MED ORDER — IOHEXOL 350 MG/ML SOLN
INTRAVENOUS | Status: DC | PRN
  Administered 2023-09-22: 35 mL

## 2023-09-22 MED ORDER — FENTANYL CITRATE (PF) 100 MCG/2ML IJ SOLN
INTRAMUSCULAR | Status: AC
  Filled 2023-09-22: qty 2

## 2023-09-22 MED ORDER — LIDOCAINE HCL (PF) 1 % IJ SOLN
INTRAMUSCULAR | Status: DC | PRN
  Administered 2023-09-22: 2 mL

## 2023-09-22 MED ORDER — SODIUM CHLORIDE 0.9 % IV SOLN
250.0000 mL | INTRAVENOUS | Status: DC | PRN
Start: 2023-09-22 — End: 2023-09-22

## 2023-09-22 MED ORDER — ASPIRIN 81 MG PO TBEC
81.0000 mg | DELAYED_RELEASE_TABLET | Freq: Every day | ORAL | Status: DC
  Administered 2023-09-22: 81 mg via ORAL
  Filled 2023-09-22: qty 1

## 2023-09-22 MED ORDER — LIDOCAINE HCL (PF) 1 % IJ SOLN
INTRAMUSCULAR | Status: AC
  Filled 2023-09-22: qty 30

## 2023-09-22 MED ORDER — SODIUM CHLORIDE 0.9 % IV SOLN: INTRAVENOUS | Status: DC

## 2023-09-22 MED ORDER — SODIUM CHLORIDE 0.9% FLUSH
3.0000 mL | Freq: Two times a day (BID) | INTRAVENOUS | Status: DC
Start: 2023-09-22 — End: 2023-09-22

## 2023-09-22 MED ORDER — MIDAZOLAM HCL 2 MG/2ML IJ SOLN
INTRAMUSCULAR | Status: AC
  Filled 2023-09-22: qty 2

## 2023-09-22 MED ORDER — HEPARIN SODIUM (PORCINE) 1000 UNIT/ML IJ SOLN
INTRAMUSCULAR | Status: AC
  Filled 2023-09-22: qty 10

## 2023-09-22 MED ORDER — ASPIRIN 81 MG PO CHEW: 81.0000 mg | CHEWABLE_TABLET | ORAL | Status: DC

## 2023-09-22 MED ORDER — HEPARIN (PORCINE) IN NACL 1000-0.9 UT/500ML-% IV SOLN
INTRAVENOUS | Status: DC | PRN
  Administered 2023-09-22 (×2): 500 mL

## 2023-09-22 MED ORDER — MIDAZOLAM HCL 2 MG/2ML IJ SOLN
INTRAMUSCULAR | Status: DC | PRN
  Administered 2023-09-22: 2 mg via INTRAVENOUS

## 2023-09-22 MED ORDER — ASPIRIN 81 MG PO CHEW
81.0000 mg | CHEWABLE_TABLET | Freq: Every day | ORAL | 2 refills | Status: DC
  Filled 2023-09-22 (×2): qty 90, 90d supply, fill #0

## 2023-09-22 MED ORDER — CLOPIDOGREL BISULFATE 75 MG PO TABS
75.0000 mg | ORAL_TABLET | Freq: Every day | ORAL | 2 refills | Status: DC
  Filled 2023-09-22: qty 90, 90d supply, fill #0

## 2023-09-22 MED ORDER — SODIUM CHLORIDE 0.9 % WEIGHT BASED INFUSION: 1.0000 mL/kg/h | INTRAVENOUS | Status: DC

## 2023-09-22 MED ORDER — LABETALOL HCL 5 MG/ML IV SOLN: 10.0000 mg | INTRAVENOUS | Status: DC | PRN

## 2023-09-22 MED ORDER — SODIUM CHLORIDE 0.9% FLUSH
3.0000 mL | INTRAVENOUS | Status: DC | PRN
Start: 2023-09-22 — End: 2023-09-22

## 2023-09-22 MED ORDER — VERAPAMIL HCL 2.5 MG/ML IV SOLN
INTRAVENOUS | Status: AC
  Filled 2023-09-22: qty 2

## 2023-09-22 MED ORDER — SODIUM CHLORIDE 0.9 % WEIGHT BASED INFUSION: 3.0000 mL/kg/h | INTRAVENOUS | Status: AC

## 2023-09-22 SURGICAL SUPPLY — 11 items
CATH INFINITI 5 FR AR1 MOD (CATHETERS) IMPLANT
CATH INFINITI 5 FR JL3.5 (CATHETERS) IMPLANT
CATH INFINITI JR4 5F (CATHETERS) IMPLANT
DEVICE RAD COMP TR BAND LRG (VASCULAR PRODUCTS) IMPLANT
ELECT DEFIB PAD ADLT CADENCE (PAD) IMPLANT
GLIDESHEATH SLEND A-KIT 6F 22G (SHEATH) IMPLANT
GLIDESHEATH SLEND SS 6F .021 (SHEATH) IMPLANT
GUIDEWIRE INQWIRE 1.5J.035X260 (WIRE) IMPLANT
INQWIRE 1.5J .035X260CM (WIRE) ×1
PACK CARDIAC CATHETERIZATION (CUSTOM PROCEDURE TRAY) ×1 IMPLANT
SET ATX-X65L (MISCELLANEOUS) IMPLANT

## 2023-09-22 NOTE — Discharge Summary (Signed)
Discharge Summary    Patient ID: Billy Andersen MRN: [id number]; DOB: 11/08/1976  Admit date: 09/21/2023 Discharge date: 09/22/2023  PCP:  Pcp, No   [facility] Providers Cardiologist:  Kristeen Miss, MD     Discharge Diagnoses    Principal Problem:   Chest pain Active Problems:   Dyspnea   Pure hypercholesterolemia  Diagnostic Studies/Procedures    Cath: 09/22/2023  Widely patent RCA stent with no stenosis Patent coronary arteries with no obstructive disease Mild mid-LAD bridging - incidental finding not clinically significant Normal LVEDP   Recommend: continue med Rx from recent NSTEMI, patient reassured, DC home today  Diagnostic Dominance: Right  Intervention   _____________   History of Present Illness     Billy Andersen is a 47 y.o. male with recent RCA NSTEMI s/p proximal RCA PCI who is being seen 09/21/2023 for the evaluation of chest pain and dyspnea. Mr. Pickler presents with dyspnea with minimal exertion and progressive chest discomfort. He reports that since discharge roughly one week ago from RCA PCI he has persistent dyspnea with any exertion. He notices this with standing up to walk to the restroom. He has some dyspnea when he initially lays down for bed which resolves with continued recumbence. He denies any PND or new LE edema.    He also has two types of chest pain. The first of which is sharp and pleuritic in nature and has been present since discharge. There is no positional component to this pain. The second type of pain is progressive with increased frequency of dull left sided chest pain that occurs randomly and is not reliably exacerbated with exertion. These episodes are occurring more frequently and is now radiating to his left arm and jaw. These symptoms are similar to the those he presented with last week. He reports adherence to DAPT therapy with no missed doses of medication.   He was admitted one week ago with left  sided chest, arm, and jaw pain. Troponin was elevated to 200. Cath with 100% proximal RCA with collaterals from left to right and LVEDP of 20 mmHg. PCI was successful and he was discharged on ASA and Ticagrelor. He was discharged with plan for outpatient Echocardiogram which has not been completed.  Hospital Course     Chest pain CAD s/p recent NSTEMI with PCI to RCA -- Presented with chest pressure and radiation up into his jaw as well as a pleuritic type pain.  Stated his symptoms of pressure and occasional pain were similar to what he experienced with his MI. hsTn negative x2, EKG without ischemic changes. Sed rate 1. Underwent cardiac cath noted above with widely patent RCA stent. Continue medical therapy. Did undergo echo showing LVEF of 55-60%, normal Rv, basal inferior akinesis consistent with recent MI.  -- continue ASA, now on plavix with load (see below), metoprolol succ 25mg  daily, Crestor 40mg  daily   Dyspnea -- reports episodes seem to be mostly with exertion. He was placed on Brilinta post MI and this has been changed to plavix with load -- BNP 9, CXR negative -- suspect this may be Brilinta related    Bipolar disorder ADHD -- continue home medication regimen  Patient was seen by Dr. Jacinto Halim and deemed stable for discharge home. Follow up arranged in the office. Medications sent to the [facility] pharmacy.   _____________  Discharge Vitals Blood pressure 122/75, pulse 70, temperature 98.1 F (36.7 C), temperature source Oral, resp. rate 20, height 6\' 2"  (1.88 m),  weight 106.6 kg, SpO2 100%.  Filed Weights   09/21/23 1844  Weight: 106.6 kg    Labs & Radiologic Studies    CBC Recent Labs    09/21/23 1845 09/22/23 1000  WBC 6.6 5.0  HGB 16.4 15.6  HCT 46.9 44.7  MCV 90.4 89.0  PLT 230 200   Basic Metabolic Panel Recent Labs    95/28/41 1845 09/22/23 0955  NA 137 139  K 4.1 4.0  CL 102 104  CO2 25 26  GLUCOSE 96 94  BUN 12 11  CREATININE 1.06 0.98  CALCIUM 9.1  8.8*   Liver Function Tests No results for input(s): "AST", "ALT", "ALKPHOS", "BILITOT", "PROT", "ALBUMIN" in the last 72 hours. No results for input(s): "LIPASE", "AMYLASE" in the last 72 hours. High Sensitivity Troponin:   Recent Labs  Lab 09/13/23 1050 09/13/23 1516 09/13/23 1716 09/21/23 1845 09/21/23 2051  TROPONINIHS 144* 203* 220* 5 5    BNP Invalid input(s): "POCBNP" D-Dimer No results for input(s): "DDIMER" in the last 72 hours. Hemoglobin A1C No results for input(s): "HGBA1C" in the last 72 hours. Fasting Lipid Panel No results for input(s): "CHOL", "HDL", "LDLCALC", "TRIG", "CHOLHDL", "LDLDIRECT" in the last 72 hours. Thyroid Function Tests No results for input(s): "TSH", "T4TOTAL", "T3FREE", "THYROIDAB" in the last 72 hours.  Invalid input(s): "FREET3" _____________  CARDIAC CATHETERIZATION  Result Date: 09/22/2023 Widely patent RCA stent with no stenosis Patent coronary arteries with no obstructive disease Mild mid-LAD bridging - incidental finding not clinically significant Normal LVEDP Recommend: continue med Rx from recent NSTEMI, patient reassured, DC home today   ECHOCARDIOGRAM COMPLETE  Result Date: 09/22/2023    ECHOCARDIOGRAM REPORT   Patient Name:   [name] Date of Exam: 09/22/2023 Medical Rec #:  [id number]             Height:       74.0 in Accession #:    [id number]            Weight:       235.0 lb Date of Birth:  15-Oct-1976             BSA:          2.328 m Patient Age:    47 years              BP:           112/74 mmHg Patient Gender: M                     HR:           60 bpm. Exam Location:  Inpatient Procedure: 2D Echo, Cardiac Doppler and Color Doppler Indications:    Chest pain  History:        Patient has prior history of Echocardiogram examinations and                 Patient has no prior history of Echocardiogram examinations.                 CAD, Signs/Symptoms:Chest Pain; Risk Factors:Dyslipidemia.  Sonographer:    Melton Krebs  RDCS, FE, PE Referring Phys: [id number] BELAL A SULEIMAN IMPRESSIONS  1. Left ventricular ejection fraction, by estimation, is 55 to 60%. The left ventricle has normal function. The left ventricle demonstrates regional wall motion abnormalities (see scoring diagram/findings for description). Left ventricular diastolic parameters are indeterminate.  2. Right ventricular systolic function is normal. The right ventricular size is normal.  3.  weight 106.6 kg, SpO2 100%.  Filed Weights   09/21/23 1844  Weight: 106.6 kg    Labs & Radiologic Studies    CBC Recent Labs    09/21/23 1845 09/22/23 1000  WBC 6.6 5.0  HGB 16.4 15.6  HCT 46.9 44.7  MCV 90.4 89.0  PLT 230 200   Basic Metabolic Panel Recent Labs    95/28/41 1845 09/22/23 0955  NA 137 139  K 4.1 4.0  CL 102 104  CO2 25 26  GLUCOSE 96 94  BUN 12 11  CREATININE 1.06 0.98  CALCIUM 9.1  8.8*   Liver Function Tests No results for input(s): "AST", "ALT", "ALKPHOS", "BILITOT", "PROT", "ALBUMIN" in the last 72 hours. No results for input(s): "LIPASE", "AMYLASE" in the last 72 hours. High Sensitivity Troponin:   Recent Labs  Lab 09/13/23 1050 09/13/23 1516 09/13/23 1716 09/21/23 1845 09/21/23 2051  TROPONINIHS 144* 203* 220* 5 5    BNP Invalid input(s): "POCBNP" D-Dimer No results for input(s): "DDIMER" in the last 72 hours. Hemoglobin A1C No results for input(s): "HGBA1C" in the last 72 hours. Fasting Lipid Panel No results for input(s): "CHOL", "HDL", "LDLCALC", "TRIG", "CHOLHDL", "LDLDIRECT" in the last 72 hours. Thyroid Function Tests No results for input(s): "TSH", "T4TOTAL", "T3FREE", "THYROIDAB" in the last 72 hours.  Invalid input(s): "FREET3" _____________  CARDIAC CATHETERIZATION  Result Date: 09/22/2023 Widely patent RCA stent with no stenosis Patent coronary arteries with no obstructive disease Mild mid-LAD bridging - incidental finding not clinically significant Normal LVEDP Recommend: continue med Rx from recent NSTEMI, patient reassured, DC home today   ECHOCARDIOGRAM COMPLETE  Result Date: 09/22/2023    ECHOCARDIOGRAM REPORT   Patient Name:   [name] Date of Exam: 09/22/2023 Medical Rec #:  [id number]             Height:       74.0 in Accession #:    [id number]            Weight:       235.0 lb Date of Birth:  15-Oct-1976             BSA:          2.328 m Patient Age:    47 years              BP:           112/74 mmHg Patient Gender: M                     HR:           60 bpm. Exam Location:  Inpatient Procedure: 2D Echo, Cardiac Doppler and Color Doppler Indications:    Chest pain  History:        Patient has prior history of Echocardiogram examinations and                 Patient has no prior history of Echocardiogram examinations.                 CAD, Signs/Symptoms:Chest Pain; Risk Factors:Dyslipidemia.  Sonographer:    Melton Krebs  RDCS, FE, PE Referring Phys: [id number] BELAL A SULEIMAN IMPRESSIONS  1. Left ventricular ejection fraction, by estimation, is 55 to 60%. The left ventricle has normal function. The left ventricle demonstrates regional wall motion abnormalities (see scoring diagram/findings for description). Left ventricular diastolic parameters are indeterminate.  2. Right ventricular systolic function is normal. The right ventricular size is normal.  3.  weight 106.6 kg, SpO2 100%.  Filed Weights   09/21/23 1844  Weight: 106.6 kg    Labs & Radiologic Studies    CBC Recent Labs    09/21/23 1845 09/22/23 1000  WBC 6.6 5.0  HGB 16.4 15.6  HCT 46.9 44.7  MCV 90.4 89.0  PLT 230 200   Basic Metabolic Panel Recent Labs    95/28/41 1845 09/22/23 0955  NA 137 139  K 4.1 4.0  CL 102 104  CO2 25 26  GLUCOSE 96 94  BUN 12 11  CREATININE 1.06 0.98  CALCIUM 9.1  8.8*   Liver Function Tests No results for input(s): "AST", "ALT", "ALKPHOS", "BILITOT", "PROT", "ALBUMIN" in the last 72 hours. No results for input(s): "LIPASE", "AMYLASE" in the last 72 hours. High Sensitivity Troponin:   Recent Labs  Lab 09/13/23 1050 09/13/23 1516 09/13/23 1716 09/21/23 1845 09/21/23 2051  TROPONINIHS 144* 203* 220* 5 5    BNP Invalid input(s): "POCBNP" D-Dimer No results for input(s): "DDIMER" in the last 72 hours. Hemoglobin A1C No results for input(s): "HGBA1C" in the last 72 hours. Fasting Lipid Panel No results for input(s): "CHOL", "HDL", "LDLCALC", "TRIG", "CHOLHDL", "LDLDIRECT" in the last 72 hours. Thyroid Function Tests No results for input(s): "TSH", "T4TOTAL", "T3FREE", "THYROIDAB" in the last 72 hours.  Invalid input(s): "FREET3" _____________  CARDIAC CATHETERIZATION  Result Date: 09/22/2023 Widely patent RCA stent with no stenosis Patent coronary arteries with no obstructive disease Mild mid-LAD bridging - incidental finding not clinically significant Normal LVEDP Recommend: continue med Rx from recent NSTEMI, patient reassured, DC home today   ECHOCARDIOGRAM COMPLETE  Result Date: 09/22/2023    ECHOCARDIOGRAM REPORT   Patient Name:   [name] Date of Exam: 09/22/2023 Medical Rec #:  [id number]             Height:       74.0 in Accession #:    [id number]            Weight:       235.0 lb Date of Birth:  15-Oct-1976             BSA:          2.328 m Patient Age:    47 years              BP:           112/74 mmHg Patient Gender: M                     HR:           60 bpm. Exam Location:  Inpatient Procedure: 2D Echo, Cardiac Doppler and Color Doppler Indications:    Chest pain  History:        Patient has prior history of Echocardiogram examinations and                 Patient has no prior history of Echocardiogram examinations.                 CAD, Signs/Symptoms:Chest Pain; Risk Factors:Dyslipidemia.  Sonographer:    Melton Krebs  RDCS, FE, PE Referring Phys: [id number] BELAL A SULEIMAN IMPRESSIONS  1. Left ventricular ejection fraction, by estimation, is 55 to 60%. The left ventricle has normal function. The left ventricle demonstrates regional wall motion abnormalities (see scoring diagram/findings for description). Left ventricular diastolic parameters are indeterminate.  2. Right ventricular systolic function is normal. The right ventricular size is normal.  3.  weight 106.6 kg, SpO2 100%.  Filed Weights   09/21/23 1844  Weight: 106.6 kg    Labs & Radiologic Studies    CBC Recent Labs    09/21/23 1845 09/22/23 1000  WBC 6.6 5.0  HGB 16.4 15.6  HCT 46.9 44.7  MCV 90.4 89.0  PLT 230 200   Basic Metabolic Panel Recent Labs    95/28/41 1845 09/22/23 0955  NA 137 139  K 4.1 4.0  CL 102 104  CO2 25 26  GLUCOSE 96 94  BUN 12 11  CREATININE 1.06 0.98  CALCIUM 9.1  8.8*   Liver Function Tests No results for input(s): "AST", "ALT", "ALKPHOS", "BILITOT", "PROT", "ALBUMIN" in the last 72 hours. No results for input(s): "LIPASE", "AMYLASE" in the last 72 hours. High Sensitivity Troponin:   Recent Labs  Lab 09/13/23 1050 09/13/23 1516 09/13/23 1716 09/21/23 1845 09/21/23 2051  TROPONINIHS 144* 203* 220* 5 5    BNP Invalid input(s): "POCBNP" D-Dimer No results for input(s): "DDIMER" in the last 72 hours. Hemoglobin A1C No results for input(s): "HGBA1C" in the last 72 hours. Fasting Lipid Panel No results for input(s): "CHOL", "HDL", "LDLCALC", "TRIG", "CHOLHDL", "LDLDIRECT" in the last 72 hours. Thyroid Function Tests No results for input(s): "TSH", "T4TOTAL", "T3FREE", "THYROIDAB" in the last 72 hours.  Invalid input(s): "FREET3" _____________  CARDIAC CATHETERIZATION  Result Date: 09/22/2023 Widely patent RCA stent with no stenosis Patent coronary arteries with no obstructive disease Mild mid-LAD bridging - incidental finding not clinically significant Normal LVEDP Recommend: continue med Rx from recent NSTEMI, patient reassured, DC home today   ECHOCARDIOGRAM COMPLETE  Result Date: 09/22/2023    ECHOCARDIOGRAM REPORT   Patient Name:   [name] Date of Exam: 09/22/2023 Medical Rec #:  [id number]             Height:       74.0 in Accession #:    [id number]            Weight:       235.0 lb Date of Birth:  15-Oct-1976             BSA:          2.328 m Patient Age:    47 years              BP:           112/74 mmHg Patient Gender: M                     HR:           60 bpm. Exam Location:  Inpatient Procedure: 2D Echo, Cardiac Doppler and Color Doppler Indications:    Chest pain  History:        Patient has prior history of Echocardiogram examinations and                 Patient has no prior history of Echocardiogram examinations.                 CAD, Signs/Symptoms:Chest Pain; Risk Factors:Dyslipidemia.  Sonographer:    Melton Krebs  RDCS, FE, PE Referring Phys: [id number] BELAL A SULEIMAN IMPRESSIONS  1. Left ventricular ejection fraction, by estimation, is 55 to 60%. The left ventricle has normal function. The left ventricle demonstrates regional wall motion abnormalities (see scoring diagram/findings for description). Left ventricular diastolic parameters are indeterminate.  2. Right ventricular systolic function is normal. The right ventricular size is normal.  3.  weight 106.6 kg, SpO2 100%.  Filed Weights   09/21/23 1844  Weight: 106.6 kg    Labs & Radiologic Studies    CBC Recent Labs    09/21/23 1845 09/22/23 1000  WBC 6.6 5.0  HGB 16.4 15.6  HCT 46.9 44.7  MCV 90.4 89.0  PLT 230 200   Basic Metabolic Panel Recent Labs    95/28/41 1845 09/22/23 0955  NA 137 139  K 4.1 4.0  CL 102 104  CO2 25 26  GLUCOSE 96 94  BUN 12 11  CREATININE 1.06 0.98  CALCIUM 9.1  8.8*   Liver Function Tests No results for input(s): "AST", "ALT", "ALKPHOS", "BILITOT", "PROT", "ALBUMIN" in the last 72 hours. No results for input(s): "LIPASE", "AMYLASE" in the last 72 hours. High Sensitivity Troponin:   Recent Labs  Lab 09/13/23 1050 09/13/23 1516 09/13/23 1716 09/21/23 1845 09/21/23 2051  TROPONINIHS 144* 203* 220* 5 5    BNP Invalid input(s): "POCBNP" D-Dimer No results for input(s): "DDIMER" in the last 72 hours. Hemoglobin A1C No results for input(s): "HGBA1C" in the last 72 hours. Fasting Lipid Panel No results for input(s): "CHOL", "HDL", "LDLCALC", "TRIG", "CHOLHDL", "LDLDIRECT" in the last 72 hours. Thyroid Function Tests No results for input(s): "TSH", "T4TOTAL", "T3FREE", "THYROIDAB" in the last 72 hours.  Invalid input(s): "FREET3" _____________  CARDIAC CATHETERIZATION  Result Date: 09/22/2023 Widely patent RCA stent with no stenosis Patent coronary arteries with no obstructive disease Mild mid-LAD bridging - incidental finding not clinically significant Normal LVEDP Recommend: continue med Rx from recent NSTEMI, patient reassured, DC home today   ECHOCARDIOGRAM COMPLETE  Result Date: 09/22/2023    ECHOCARDIOGRAM REPORT   Patient Name:   [name] Date of Exam: 09/22/2023 Medical Rec #:  [id number]             Height:       74.0 in Accession #:    [id number]            Weight:       235.0 lb Date of Birth:  15-Oct-1976             BSA:          2.328 m Patient Age:    47 years              BP:           112/74 mmHg Patient Gender: M                     HR:           60 bpm. Exam Location:  Inpatient Procedure: 2D Echo, Cardiac Doppler and Color Doppler Indications:    Chest pain  History:        Patient has prior history of Echocardiogram examinations and                 Patient has no prior history of Echocardiogram examinations.                 CAD, Signs/Symptoms:Chest Pain; Risk Factors:Dyslipidemia.  Sonographer:    Melton Krebs  RDCS, FE, PE Referring Phys: [id number] BELAL A SULEIMAN IMPRESSIONS  1. Left ventricular ejection fraction, by estimation, is 55 to 60%. The left ventricle has normal function. The left ventricle demonstrates regional wall motion abnormalities (see scoring diagram/findings for description). Left ventricular diastolic parameters are indeterminate.  2. Right ventricular systolic function is normal. The right ventricular size is normal.  3.

## 2023-09-22 NOTE — Discharge Instructions (Signed)

## 2023-09-22 NOTE — ED Notes (Signed)
Patient transported to cath lab

## 2023-09-22 NOTE — Progress Notes (Signed)
Patient was given discharge instructions. He verbalized understanding. 

## 2023-09-22 NOTE — Interval H&P Note (Signed)
History and Physical Interval Note:  09/22/2023 1:39 PM  Billy Andersen  has presented today for surgery, with the diagnosis of nstemi.  The various methods of treatment have been discussed with the patient and family. After consideration of risks, benefits and other options for treatment, the patient has consented to  Procedure(s): LEFT HEART CATH AND CORONARY ANGIOGRAPHY (N/A) as a surgical intervention.  The patient's history has been reviewed, patient examined, no change in status, stable for surgery.  I have reviewed the patient's chart and labs.  Questions were answered to the patient's satisfaction.     Tonny Bollman

## 2023-09-22 NOTE — H&P (View-Only) (Signed)
   Patient Name: Billy Andersen Date of Encounter: 09/22/2023 [facility] Cardiologist: Kristeen Miss, MD   Interval Summary  .    Continues to complain of both chest pressure but also a sharp pleuritic type pain as well. Says pain is very similar to what he experienced with his MI with radiation into his jaw at times.   Vital Signs .    Vitals:   09/22/23 0046 09/22/23 0520 09/22/23 0520 09/22/23 0521  BP:  100/71    Pulse:  (!) 56  62  Resp:  14  14  Temp: 98 F (36.7 C)  98.3 F (36.8 C)   TempSrc: Oral  Oral   SpO2:  98%  99%  Weight:      Height:       No intake or output data in the 24 hours ending 09/22/23 0803    09/21/2023    6:44 PM 09/13/2023    9:00 AM 09/06/2023    2:43 PM  Last 3 Weights  Weight (lbs) 235 lb 242 lb 1 oz 242 lb  Weight (kg) 106.595 kg 109.8 kg 109.77 kg      Telemetry/ECG    Sinus Rhythm - Personally Reviewed  Physical Exam .   GEN: No acute distress.   Neck: No JVD Cardiac: RRR, no murmurs, rubs, or gallops.  Respiratory: Clear to auscultation bilaterally. GI: Soft, nontender, non-distended  MS: No edema  Assessment & Plan .     47 y.o. male with recent RCA NSTEMI s/p proximal RCA PCI who is being seen 09/21/2023 for the evaluation of chest pain and dyspnea.   Chest pain CAD s/p recent NSTEMI with PCI to RCA -- Presented with chest pressure and radiation up into his jaw as well as a pleuritic type pain.  States his symptoms of pressure and occasional pain are similar to what he experienced with his MI -- hsTn negative x2, EKG without ischemic changes -- was started on colchicine 0.6mg  BID overnight, will load this morning -- will check CRP and Sed rate -- given his symptoms, reasonable to take for relook cath to ensure no changes in anatomy -- echo pending -- continue ASA, now on plavix with load (see below), metoprolol succ 25mg  daily, Crestor 40mg  daily   Informed Consent   Shared Decision Making/Informed  Consent The risks [stroke (1 in 1000), death (1 in 1000), kidney failure [usually temporary] (1 in 500), bleeding (1 in 200), allergic reaction [possibly serious] (1 in 200)], benefits (diagnostic support and management of coronary artery disease) and alternatives of a cardiac catheterization were discussed in detail with Billy Andersen and he is willing to proceed.  Dyspnea -- reports episodes seem to be mostly with exertion. He was placed on Brilinta post MI and this has been changed to plavix with load this morning -- BNP 9, CXR negative -- suspect this may be Brilinta related   Bipolar disorder ADHD -- continue home medication regimen  For questions or updates, please contact [facility] Please consult [email or web address] for contact info under        Signed, Billy Page, NP

## 2023-09-22 NOTE — ED Notes (Signed)
Remains at cath lab

## 2023-09-22 NOTE — Progress Notes (Signed)
   Patient Name: Billy Andersen Date of Encounter: 09/22/2023 [facility] Cardiologist: Kristeen Miss, MD   Interval Summary  .    Continues to complain of both chest pressure but also a sharp pleuritic type pain as well. Says pain is very similar to what he experienced with his MI with radiation into his jaw at times.   Vital Signs .    Vitals:   09/22/23 0046 09/22/23 0520 09/22/23 0520 09/22/23 0521  BP:  100/71    Pulse:  (!) 56  62  Resp:  14  14  Temp: 98 F (36.7 C)  98.3 F (36.8 C)   TempSrc: Oral  Oral   SpO2:  98%  99%  Weight:      Height:       No intake or output data in the 24 hours ending 09/22/23 0803    09/21/2023    6:44 PM 09/13/2023    9:00 AM 09/06/2023    2:43 PM  Last 3 Weights  Weight (lbs) 235 lb 242 lb 1 oz 242 lb  Weight (kg) 106.595 kg 109.8 kg 109.77 kg      Telemetry/ECG    Sinus Rhythm - Personally Reviewed  Physical Exam .   GEN: No acute distress.   Neck: No JVD Cardiac: RRR, no murmurs, rubs, or gallops.  Respiratory: Clear to auscultation bilaterally. GI: Soft, nontender, non-distended  MS: No edema  Assessment & Plan .     47 y.o. male with recent RCA NSTEMI s/p proximal RCA PCI who is being seen 09/21/2023 for the evaluation of chest pain and dyspnea.   Chest pain CAD s/p recent NSTEMI with PCI to RCA -- Presented with chest pressure and radiation up into his jaw as well as a pleuritic type pain.  States his symptoms of pressure and occasional pain are similar to what he experienced with his MI -- hsTn negative x2, EKG without ischemic changes -- was started on colchicine 0.6mg  BID overnight, will load this morning -- will check CRP and Sed rate -- given his symptoms, reasonable to take for relook cath to ensure no changes in anatomy -- echo pending -- continue ASA, now on plavix with load (see below), metoprolol succ 25mg  daily, Crestor 40mg  daily   Informed Consent   Shared Decision Making/Informed  Consent The risks [stroke (1 in 1000), death (1 in 1000), kidney failure [usually temporary] (1 in 500), bleeding (1 in 200), allergic reaction [possibly serious] (1 in 200)], benefits (diagnostic support and management of coronary artery disease) and alternatives of a cardiac catheterization were discussed in detail with Billy Andersen and he is willing to proceed.  Dyspnea -- reports episodes seem to be mostly with exertion. He was placed on Brilinta post MI and this has been changed to plavix with load this morning -- BNP 9, CXR negative -- suspect this may be Brilinta related   Bipolar disorder ADHD -- continue home medication regimen  For questions or updates, please contact [facility] Please consult [email or web address] for contact info under        Signed, Laverda Page, NP

## 2023-09-23 ENCOUNTER — Encounter (HOSPITAL_COMMUNITY): Payer: Self-pay | Admitting: Cardiovascular Disease

## 2023-09-23 LAB — HIGH SENSITIVITY CRP: CRP, High Sensitivity: 0.66 mg/L (ref 0.00–3.00)

## 2023-09-27 ENCOUNTER — Other Ambulatory Visit: Payer: Self-pay | Admitting: Nurse Practitioner

## 2023-09-27 ENCOUNTER — Telehealth: Payer: Medicaid Other | Admitting: Nurse Practitioner

## 2023-09-27 DIAGNOSIS — U071 COVID-19: Secondary | ICD-10-CM | POA: Diagnosis not present

## 2023-09-27 MED ORDER — NIRMATRELVIR/RITONAVIR (PAXLOVID)TABLET: 3.0000 | ORAL_TABLET | Freq: Two times a day (BID) | ORAL | 0 refills | Status: DC

## 2023-09-27 NOTE — Progress Notes (Signed)
Virtual Visit Consent   Billy Andersen, you are scheduled for a virtual visit with a [facility] provider today. Just as with appointments in the office, your consent must be obtained to participate. Your consent will be active for this visit and any virtual visit you may have with one of our providers in the next 365 days. If you have a MyChart account, a copy of this consent can be sent to you electronically.  As this is a virtual visit, video technology does not allow for your provider to perform a traditional examination. This may limit your provider's ability to fully assess your condition. If your provider identifies any concerns that need to be evaluated in person or the need to arrange testing (such as labs, EKG, etc.), we will make arrangements to do so. Although advances in technology are sophisticated, we cannot ensure that it will always work on either your end or our end. If the connection with a video visit is poor, the visit may have to be switched to a telephone visit. With either a video or telephone visit, we are not always able to ensure that we have a secure connection.  By engaging in this virtual visit, you consent to the provision of healthcare and authorize for your insurance to be billed (if applicable) for the services provided during this visit. Depending on your insurance coverage, you may receive a charge related to this service.  I need to obtain your verbal consent now. Are you willing to proceed with your visit today? Billy Andersen has provided verbal consent on 09/27/2023 for a virtual visit (video or telephone). Claiborne Rigg, NP  Date: 09/27/2023 11:59 AM  Virtual Visit via Video Note   I, Claiborne Rigg, connected with  Billy Andersen  ([id number], 1975-12-13) on 09/27/23 at 11:45 AM EDT by a video-enabled telemedicine application and verified that I am speaking with the correct person using two identifiers.  Location: Patient: Virtual  Visit Location Patient: Home Provider: Virtual Visit Location Provider: Home Office   I discussed the limitations of evaluation and management by telemedicine and the availability of in person appointments. The patient expressed understanding and agreed to proceed.    History of Present Illness: Billy Andersen is a 47 y.o. who identifies as a male who was assigned male at birth, and is being seen today for COVID POSITIVE.  Billy Andersen tested positive for COVID today and reports the following symptoms:  Sore throat, Fever, Tmax 101.4 max, cough, nasal discharge, congestion.  Taking OTC: tylenol last night.  He also recently had a heart attack and a stent was placed.    Problems:  Patient Active Problem List   Diagnosis Date Noted   Pure hypercholesterolemia 09/22/2023   Chest pain 09/21/2023   Dyspnea 09/21/2023   Non-ST elevation (NSTEMI) myocardial infarction [facility]) 09/15/2023   CAD (coronary artery disease) 09/15/2023    Allergies: No Known Allergies Medications:  Current Outpatient Medications:    nirmatrelvir/ritonavir (PAXLOVID) 20 x 150 MG & 10 x 100MG  TABS, Take 3 tablets by mouth 2 (two) times daily for 5 days. (Take nirmatrelvir 150 mg two tablets twice daily for 5 days and ritonavir 100 mg one tablet twice daily for 5 days) Patient GFR is >60, Disp: 30 tablet, Rfl: 0   aspirin 81 MG chewable tablet, Chew 1 tablet (81 mg total) by mouth daily., Disp: 90 tablet, Rfl: 2   atomoxetine (STRATTERA) 80 MG capsule, Take 80 mg by mouth daily., Disp: ,  Rfl:    CAPLYTA 42 MG capsule, Take 42 mg by mouth at bedtime., Disp: , Rfl:    clopidogrel (PLAVIX) 75 MG tablet, Take 1 tablet (75 mg total) by mouth daily with breakfast., Disp: 90 tablet, Rfl: 2   lamoTRIgine (LAMICTAL) 150 MG tablet, Take 150 mg by mouth at bedtime., Disp: , Rfl:    metoprolol succinate (TOPROL-XL) 25 MG 24 hr tablet, Take 1 tablet (25 mg total) by mouth daily. Take with or immediately following a meal., Disp:  30 tablet, Rfl: 2   Misc Natural Products (AIRBORNE ELDERBERRY) 100-50 MG CHEW, Chew 2 tablets by mouth daily., Disp: , Rfl:    nitroGLYCERIN (NITROSTAT) 0.4 MG SL tablet, Place 1 tablet (0.4 mg total) under the tongue every 5 (five) minutes x 3 doses as needed for chest pain., Disp: 25 tablet, Rfl: 1   Omega Fatty Acids-Vitamins (OMEGA-3 GUMMIES) CHEW, Chew 2 tablets by mouth daily. VitaFusion, Disp: , Rfl:    rosuvastatin (CRESTOR) 40 MG tablet, Take 1 tablet (40 mg total) by mouth daily., Disp: 30 tablet, Rfl: 2  Observations/Objective: Patient is well-developed, well-nourished in no acute distress.  Resting comfortably  at home.  Head is normocephalic, atraumatic.  No labored breathing.  Speech is clear and coherent with logical content.  Patient is alert and oriented at baseline.    Assessment and Plan: 1. Positive self-administered antigen test for COVID-19 - nirmatrelvir/ritonavir (PAXLOVID) 20 x 150 MG & 10 x 100MG  TABS; Take 3 tablets by mouth 2 (two) times daily for 5 days. (Take nirmatrelvir 150 mg two tablets twice daily for 5 days and ritonavir 100 mg one tablet twice daily for 5 days) Patient GFR is >60  Dispense: 30 tablet; Refill: 0  Please keep well-hydrated and get plenty of rest. Start a saline nasal rinse to flush out your nasal passages. You can use plain Mucinex to help thin congestion. If you have a humidifier, you can use this daily as needed.    You are to wear a mask for 5 days from onset of your symptoms.  After day 5, if you have had no fever and you are feeling better with NO symptoms, you can end masking. Keep in mind you can be contagious 10 days from the onset of symptoms  After day 5 if you have a fever or are having significant symptoms, please wear your mask for full 10 days.   If you note any worsening of symptoms, any significant shortness of breath or any chest pain, please seek ER evaluation ASAP.  Please do not delay care!    If you note any  worsening of symptoms, any significant shortness of breath or any chest pain, please seek ER evaluation ASAP.  Please do not delay care!   Follow Up Instructions: I discussed the assessment and treatment plan with the patient. The patient was provided an opportunity to ask questions and all were answered. The patient agreed with the plan and demonstrated an understanding of the instructions.  A copy of instructions were sent to the patient via MyChart unless otherwise noted below.     The patient was advised to call back or seek an in-person evaluation if the symptoms worsen or if the condition fails to improve as anticipated.    Claiborne Rigg, NP

## 2023-09-27 NOTE — Patient Instructions (Signed)
Billy Andersen, thank you for joining Claiborne Rigg, NP for today's virtual visit.  While this provider is not your primary care provider (PCP), if your PCP is located in our provider database this encounter information will be shared with them immediately following your visit.   A [facility] MyChart account gives you access to today's visit and all your visits, tests, and labs performed at [facility] " click here if you don't have a [employer] MyChart account or go to [email or web address]  Consent: (Patient) Billy Andersen provided verbal consent for this virtual visit at the beginning of the encounter.  Current Medications:  Current Outpatient Medications:    nirmatrelvir/ritonavir (PAXLOVID) 20 x 150 MG & 10 x 100MG  TABS, Take 3 tablets by mouth 2 (two) times daily for 5 days. (Take nirmatrelvir 150 mg two tablets twice daily for 5 days and ritonavir 100 mg one tablet twice daily for 5 days) Patient GFR is >60, Disp: 30 tablet, Rfl: 0   aspirin 81 MG chewable tablet, Chew 1 tablet (81 mg total) by mouth daily., Disp: 90 tablet, Rfl: 2   atomoxetine (STRATTERA) 80 MG capsule, Take 80 mg by mouth daily., Disp: , Rfl:    CAPLYTA 42 MG capsule, Take 42 mg by mouth at bedtime., Disp: , Rfl:    clopidogrel (PLAVIX) 75 MG tablet, Take 1 tablet (75 mg total) by mouth daily with breakfast., Disp: 90 tablet, Rfl: 2   lamoTRIgine (LAMICTAL) 150 MG tablet, Take 150 mg by mouth at bedtime., Disp: , Rfl:    metoprolol succinate (TOPROL-XL) 25 MG 24 hr tablet, Take 1 tablet (25 mg total) by mouth daily. Take with or immediately following a meal., Disp: 30 tablet, Rfl: 2   Misc Natural Products (AIRBORNE ELDERBERRY) 100-50 MG CHEW, Chew 2 tablets by mouth daily., Disp: , Rfl:    nitroGLYCERIN (NITROSTAT) 0.4 MG SL tablet, Place 1 tablet (0.4 mg total) under the tongue every 5 (five) minutes x 3 doses as needed for chest pain., Disp: 25 tablet, Rfl: 1   Omega Fatty  Acids-Vitamins (OMEGA-3 GUMMIES) CHEW, Chew 2 tablets by mouth daily. VitaFusion, Disp: , Rfl:    rosuvastatin (CRESTOR) 40 MG tablet, Take 1 tablet (40 mg total) by mouth daily., Disp: 30 tablet, Rfl: 2   Medications ordered in this encounter:  Meds ordered this encounter  Medications   nirmatrelvir/ritonavir (PAXLOVID) 20 x 150 MG & 10 x 100MG  TABS    Sig: Take 3 tablets by mouth 2 (two) times daily for 5 days. (Take nirmatrelvir 150 mg two tablets twice daily for 5 days and ritonavir 100 mg one tablet twice daily for 5 days) Patient GFR is >60    Dispense:  30 tablet    Refill:  0    Order Specific Question:   Supervising Provider    Answer:   Merrilee Jansky [id number]     *If you need refills on other medications prior to your next appointment, please contact your pharmacy*  Follow-Up: Call back or seek an in-person evaluation if the symptoms worsen or if the condition fails to improve as anticipated.  [facility] [phone]  Other Instructions  Please keep well-hydrated and get plenty of rest. Start a saline nasal rinse to flush out your nasal passages. You can use plain Mucinex to help thin congestion. If you have a humidifier, you can use this daily as needed.    You are to wear a mask for 5 days from  onset of your symptoms.  After day 5, if you have had no fever and you are feeling better with NO symptoms, you can end masking. Keep in mind you can be contagious 10 days from the onset of symptoms  After day 5 if you have a fever or are having significant symptoms, please wear your mask for full 10 days.   If you note any worsening of symptoms, any significant shortness of breath or any chest pain, please seek ER evaluation ASAP.  Please do not delay care!    If you note any worsening of symptoms, any significant shortness of breath or any chest pain, please seek ER evaluation ASAP.  Please do not delay care!    If you have been instructed to have an  in-person evaluation today at a local Urgent Care facility, please use the link below. It will take you to a list of all of our available [facility], including address, phone number and hours of operation. Please do not delay care.  [facility]  If you or a family member do not have a primary care provider, use the link below to schedule a visit and establish care. When you choose a [facility] primary care physician or advanced practice provider, you gain a long-term partner in health. Find a Primary Care Provider  Learn more about [facility]'s in-office and virtual care options: [facility] - Get Care Now

## 2023-10-01 ENCOUNTER — Other Ambulatory Visit (HOSPITAL_COMMUNITY): Payer: Self-pay

## 2023-10-01 NOTE — Assessment & Plan Note (Addendum)
S/p NSTEMI 09/2023 tx with DES to RCA. He has no significant CAD elsewhere. He was readmitted with recurrent chest pain and shortness of breath. Relook cardiac catheterization showed patent RCA stent. He was transitioned to Clopidogrel due to side effects to Brilinta with shortness of breath.  He is currently doing well without recurrent chest pain to suggest angina.  We did discuss the need to avoid stimulants.  Given his recent myocardial infarction he really should not be on Strattera.  He should discuss this with his prescribing physician.  Blood pressure has run low.  His EF is normal.  For, he does not need beta-blocker therapy (REDUCE-AMI) - Continue aspirin 81 mg daily, Plavix 75 mg daily, Crestor 40 mg daily - Discontinue metoprolol after tapering (half a pill for a couple of days) - Ok to start cardiac rehab - Encourage daily walking, gradually increasing to 30 minutes - Avoid stimulants. Discuss Strattera alternatives with prescribing doctor - Follow-up 3 months

## 2023-10-02 ENCOUNTER — Encounter: Payer: Self-pay | Admitting: Physician Assistant

## 2023-10-02 ENCOUNTER — Ambulatory Visit: Payer: Medicaid Other | Attending: Physician Assistant | Admitting: Physician Assistant

## 2023-10-02 ENCOUNTER — Other Ambulatory Visit: Payer: Self-pay | Admitting: *Deleted

## 2023-10-02 VITALS — BP 100/70 | HR 72 | Ht 74.0 in | Wt 237.2 lb

## 2023-10-02 DIAGNOSIS — I214 Non-ST elevation (NSTEMI) myocardial infarction: Secondary | ICD-10-CM

## 2023-10-02 DIAGNOSIS — I77810 Thoracic aortic ectasia: Secondary | ICD-10-CM | POA: Diagnosis not present

## 2023-10-02 DIAGNOSIS — E7800 Pure hypercholesterolemia, unspecified: Secondary | ICD-10-CM

## 2023-10-02 DIAGNOSIS — F909 Attention-deficit hyperactivity disorder, unspecified type: Secondary | ICD-10-CM | POA: Diagnosis not present

## 2023-10-02 DIAGNOSIS — K760 Fatty (change of) liver, not elsewhere classified: Secondary | ICD-10-CM

## 2023-10-02 MED ORDER — METOPROLOL SUCCINATE ER 25 MG PO TB24: ORAL_TABLET | ORAL | Status: DC

## 2023-10-02 NOTE — Assessment & Plan Note (Signed)
Goal LDL <55.  Continue Crestor 40 mg daily.  Arrange fasting CMET, lipids in 3 months.

## 2023-10-02 NOTE — Patient Instructions (Addendum)
Medication Instructions:  Your physician has recommended you make the following change in your medication:   REDUCE the Toprol to 1/2 tablet daily for 2 days then stop  *If you need a refill on your cardiac medications before your next appointment, please call your pharmacy*   Lab Work: Come to your next appointment FASTING for:  LIPID & CMET  If you have labs (blood work) drawn today and your tests are completely normal, you will receive your results only by: MyChart Message (if you have MyChart) OR A paper copy in the mail If you have any lab test that is abnormal or we need to change your treatment, we will call you to review the results.   Testing/Procedures: None ordered   Follow-Up: At [facility], you and your health needs are our priority.  As part of our continuing mission to provide you with exceptional heart care, we have created designated Provider Care Teams.  These Care Teams include your primary Cardiologist (physician) and Advanced Practice Providers (APPs -  Physician Assistants and Nurse Practitioners) who all work together to provide you with the care you need, when you need it.  We recommend signing up for the patient portal called "MyChart".  Sign up information is provided on this After Visit Summary.  MyChart is used to connect with patients for Virtual Visits (Telemedicine).  Patients are able to view lab/test results, encounter notes, upcoming appointments, etc.  Non-urgent messages can be sent to your provider as well.   To learn more about what you can do with MyChart, go to [email or web address].    Your next appointment:   3 month(s)  01/05/24 8:00  Provider:   Kristeen Miss, MD  or Tereso Newcomer, [name]         Other Instructions  Discuss with the Dr that prescribes Strattera, as it is best to avoid taking since you have had a MI.

## 2023-10-02 NOTE — Assessment & Plan Note (Signed)
42 mm on TTE. This can be monitored with annual echocardiograms.

## 2023-10-07 ENCOUNTER — Encounter (HOSPITAL_COMMUNITY): Payer: Self-pay

## 2023-10-07 ENCOUNTER — Telehealth: Payer: Medicaid Other | Admitting: Physician Assistant

## 2023-10-07 ENCOUNTER — Telehealth (HOSPITAL_COMMUNITY): Payer: Self-pay

## 2023-10-07 DIAGNOSIS — L03313 Cellulitis of chest wall: Secondary | ICD-10-CM | POA: Diagnosis not present

## 2023-10-07 MED ORDER — CEPHALEXIN 500 MG PO CAPS: 500.0000 mg | ORAL_CAPSULE | Freq: Two times a day (BID) | ORAL | 0 refills | Status: AC

## 2023-10-07 MED ORDER — MUPIROCIN 2 % EX OINT: 1.0000 | TOPICAL_OINTMENT | Freq: Two times a day (BID) | CUTANEOUS | 0 refills | Status: DC

## 2023-10-07 NOTE — Telephone Encounter (Signed)
Attempted to call patient in regards to Cardiac Rehab - LM on VM Mailed letter 

## 2023-10-07 NOTE — Progress Notes (Signed)
Virtual Visit Consent   Billy Andersen, you are scheduled for a virtual visit with a [facility] provider today. Just as with appointments in the office, your consent must be obtained to participate. Your consent will be active for this visit and any virtual visit you may have with one of our providers in the next 365 days. If you have a MyChart account, a copy of this consent can be sent to you electronically.  As this is a virtual visit, video technology does not allow for your provider to perform a traditional examination. This may limit your provider's ability to fully assess your condition. If your provider identifies any concerns that need to be evaluated in person or the need to arrange testing (such as labs, EKG, etc.), we will make arrangements to do so. Although advances in technology are sophisticated, we cannot ensure that it will always work on either your end or our end. If the connection with a video visit is poor, the visit may have to be switched to a telephone visit. With either a video or telephone visit, we are not always able to ensure that we have a secure connection.  By engaging in this virtual visit, you consent to the provision of healthcare and authorize for your insurance to be billed (if applicable) for the services provided during this visit. Depending on your insurance coverage, you may receive a charge related to this service.  I need to obtain your verbal consent now. Are you willing to proceed with your visit today? Alpha Mysliwiec has provided verbal consent on 10/07/2023 for a virtual visit (video or telephone). Billy Andersen, [place]  Date: 10/07/2023 4:16 PM  Virtual Visit via Video Note   I, Billy Andersen, connected with  Holdan Stucke  ([id number], 09-Sep-1976) on 10/07/23 at  4:00 PM EST by a video-enabled telemedicine application and verified that I am speaking with the correct person using two identifiers.  Location: Patient:  Virtual Visit Location Patient: Home Provider: Virtual Visit Location Provider: Home Office   I discussed the limitations of evaluation and management by telemedicine and the availability of in person appointments. The patient expressed understanding and agreed to proceed.    History of Present Illness: Billy Andersen is a 47 y.o. who identifies as a male who was assigned male at birth, and is being seen today for area of his chest noting a few days ago. Initially just itchy without lesion. Scratched the area. Now with superficial ulceration of skin with tenderness that has now got a dark scab over the area. Denies fever, chills. Initially some yellow drainage.  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/02/2023   Pure hypercholesterolemia 09/22/2023   Chest pain 09/21/2023   Dyspnea 09/21/2023   Non-ST elevation (NSTEMI) myocardial infarction [facility]) 09/15/2023   CAD (coronary artery disease) 09/15/2023    Allergies: No Known Allergies Medications:  Current Outpatient Medications:    cephALEXin (KEFLEX) 500 MG capsule, Take 1 capsule (500 mg total) by mouth 2 (two) times daily for 7 days., Disp: 14 capsule, Rfl: 0   mupirocin ointment (BACTROBAN) 2 %, Apply 1 Application topically 2 (two) times daily., Disp: 22 g, Rfl: 0   aspirin 81 MG chewable tablet, Chew 1 tablet (81 mg total) by mouth daily., Disp: 90 tablet, Rfl: 2   atomoxetine (STRATTERA) 80 MG capsule, Take 80 mg by mouth daily., Disp: , Rfl:  CAPLYTA 42 MG capsule, Take 42 mg by mouth at bedtime., Disp: , Rfl:    clopidogrel (PLAVIX) 75 MG tablet, Take 1 tablet (75 mg total) by mouth daily with breakfast., Disp: 90 tablet, Rfl: 2   lamoTRIgine (LAMICTAL) 150 MG tablet, Take 150 mg by mouth at bedtime., Disp: , Rfl:    Misc Natural Products (AIRBORNE ELDERBERRY) 100-50 MG CHEW, Chew 2 tablets by mouth daily., Disp: , Rfl:    nitroGLYCERIN (NITROSTAT) 0.4  MG SL tablet, Place 1 tablet (0.4 mg total) under the tongue every 5 (five) minutes x 3 doses as needed for chest pain., Disp: 25 tablet, Rfl: 1   Omega Fatty Acids-Vitamins (OMEGA-3 GUMMIES) CHEW, Chew 2 tablets by mouth daily. VitaFusion, Disp: , Rfl:    rosuvastatin (CRESTOR) 40 MG tablet, Take 1 tablet (40 mg total) by mouth daily., Disp: 30 tablet, Rfl: 2  Observations/Objective: Patient is well-developed, well-nourished in no acute distress.  Resting comfortably at home.  Head is normocephalic, atraumatic.  No labored breathing. Speech is clear and coherent with logical content.  Patient is alert and oriented at baseline.  4 cm area of erythema surrounding a central area of scabbing, located of anterior chest, just left of lower sternum.   Assessment and Plan: 1. Cellulitis of chest wall - mupirocin ointment (BACTROBAN) 2 %; Apply 1 Application topically 2 (two) times daily.  Dispense: 22 g; Refill: 0 - cephALEXin (KEFLEX) 500 MG capsule; Take 1 capsule (500 mg total) by mouth 2 (two) times daily for 7 days.  Dispense: 14 capsule; Refill: 0  Supportive measures and OTC medications. Keflex and Mupirocin per orders. Follow-up in person of not resolving.   Follow Up Instructions: I discussed the assessment and treatment plan with the patient. The patient was provided an opportunity to ask questions and all were answered. The patient agreed with the plan and demonstrated an understanding of the instructions.  A copy of instructions were sent to the patient via MyChart unless otherwise noted below.   The patient was advised to call back or seek an in-person evaluation if the symptoms worsen or if the condition fails to improve as anticipated.    Billy Climes, [name]

## 2023-10-07 NOTE — Patient Instructions (Signed)
  Billy Andersen, thank you for joining [name], [name] for today's virtual visit.  While this provider is not your primary care provider (PCP), if your PCP is located in our provider database this encounter information will be shared with them immediately following your visit.   A [name] MyChart account gives you access to today's visit and all your visits, tests, and labs performed at [facility] " click here if you don't have a [name] account or go to [email or web address]  Consent: (Patient) Billy Andersen provided verbal consent for this virtual visit at the beginning of the encounter.  Current Medications:  Current Outpatient Medications:    aspirin 81 MG chewable tablet, Chew 1 tablet (81 mg total) by mouth daily., Disp: 90 tablet, Rfl: 2   atomoxetine (STRATTERA) 80 MG capsule, Take 80 mg by mouth daily., Disp: , Rfl:    CAPLYTA 42 MG capsule, Take 42 mg by mouth at bedtime., Disp: , Rfl:    clopidogrel (PLAVIX) 75 MG tablet, Take 1 tablet (75 mg total) by mouth daily with breakfast., Disp: 90 tablet, Rfl: 2   lamoTRIgine (LAMICTAL) 150 MG tablet, Take 150 mg by mouth at bedtime., Disp: , Rfl:    metoprolol succinate (TOPROL XL) 25 MG 24 hr tablet, Reduce to 1/2 tablet a daily for 2 days then stop, Disp: , Rfl:    Misc Natural Products (AIRBORNE ELDERBERRY) 100-50 MG CHEW, Chew 2 tablets by mouth daily., Disp: , Rfl:    nitroGLYCERIN (NITROSTAT) 0.4 MG SL tablet, Place 1 tablet (0.4 mg total) under the tongue every 5 (five) minutes x 3 doses as needed for chest pain., Disp: 25 tablet, Rfl: 1   Omega Fatty Acids-Vitamins (OMEGA-3 GUMMIES) CHEW, Chew 2 tablets by mouth daily. VitaFusion, Disp: , Rfl:    rosuvastatin (CRESTOR) 40 MG tablet, Take 1 tablet (40 mg total) by mouth daily., Disp: 30 tablet, Rfl: 2   Medications ordered in this encounter:  No orders of the defined types were placed in this encounter.    *If you need  refills on other medications prior to your next appointment, please contact your pharmacy*  Follow-Up: Call back or seek an in-person evaluation if the symptoms worsen or if the condition fails to improve as anticipated.  [name] [phone]  Other Instructions Take the Keflex as directed twice daily. Ok to continue the topical Mupirocin (refill sent). Make sure to keep the area clean and dry. If not resolving or any new or worsening symptoms despite treatment, you need to seek an in-person evaluation ASAP.   If you have been instructed to have an in-person evaluation today at a local Urgent Care facility, please use the link below. It will take you to a list of all of our available [facility], including address, phone number and hours of operation. Please do not delay care.  [name]  If you or a family member do not have a primary care provider, use the link below to schedule a visit and establish care. When you choose a [name] primary care physician or advanced practice provider, you gain a long-term partner in health. Find a Primary Care Provider  Learn more about [facility] in-office and virtual care options:  - Get Care Now

## 2023-10-15 ENCOUNTER — Encounter (HOSPITAL_COMMUNITY): Payer: Self-pay

## 2023-10-20 ENCOUNTER — Ambulatory Visit: Payer: Medicaid Other | Admitting: Internal Medicine

## 2023-10-22 ENCOUNTER — Encounter (HOSPITAL_COMMUNITY)
Admission: RE | Admit: 2023-10-22 | Discharge: 2023-10-22 | Disposition: A | Discharge status: Home / Self Care | Payer: Medicaid Other | Source: Ambulatory Visit | Attending: Cardiovascular Disease | Admitting: Cardiovascular Disease

## 2023-10-22 VITALS — BP 112/78 | HR 66 | Ht 72.0 in | Wt 243.2 lb

## 2023-10-22 DIAGNOSIS — Z955 Presence of coronary angioplasty implant and graft: Secondary | ICD-10-CM | POA: Diagnosis present

## 2023-10-22 DIAGNOSIS — Z48812 Encounter for surgical aftercare following surgery on the circulatory system: Secondary | ICD-10-CM | POA: Diagnosis not present

## 2023-10-22 DIAGNOSIS — I252 Old myocardial infarction: Secondary | ICD-10-CM | POA: Insufficient documentation

## 2023-10-22 DIAGNOSIS — I214 Non-ST elevation (NSTEMI) myocardial infarction: Secondary | ICD-10-CM

## 2023-10-22 NOTE — Progress Notes (Signed)
Cardiac Individual Treatment Plan  Patient Details  Name: Billy Andersen MRN: [id number] Date of Birth: September 22, 1976 Referring Provider:   Flowsheet Row INTENSIVE CARDIAC REHAB ORIENT from 10/22/2023 in [facility]  Referring Provider Kristeen Miss, MD       Initial Encounter Date:  Flowsheet Row INTENSIVE CARDIAC REHAB ORIENT from 10/22/2023 in [facility]  Date 10/22/23       Visit Diagnosis: 09/13/23 NSTEMI (non-ST elevated myocardial infarction) (HCC)  09/13/23 DES RCA  Patient's Home Medications on Admission:  Current Outpatient Medications:    aspirin 81 MG chewable tablet, Chew 1 tablet (81 mg total) by mouth daily., Disp: 90 tablet, Rfl: 2   CAPLYTA 42 MG capsule, Take 42 mg by mouth at bedtime., Disp: , Rfl:    clopidogrel (PLAVIX) 75 MG tablet, Take 1 tablet (75 mg total) by mouth daily with breakfast., Disp: 90 tablet, Rfl: 2   lamoTRIgine (LAMICTAL) 150 MG tablet, Take 150 mg by mouth at bedtime., Disp: , Rfl:    Misc Natural Products (AIRBORNE ELDERBERRY) 100-50 MG CHEW, Chew 2 tablets by mouth daily., Disp: , Rfl:    mupirocin ointment (BACTROBAN) 2 %, Apply 1 Application topically 2 (two) times daily., Disp: 22 g, Rfl: 0   nitroGLYCERIN (NITROSTAT) 0.4 MG SL tablet, Place 1 tablet (0.4 mg total) under the tongue every 5 (five) minutes x 3 doses as needed for chest pain., Disp: 25 tablet, Rfl: 1   Omega Fatty Acids-Vitamins (OMEGA-3 GUMMIES) CHEW, Chew 2 tablets by mouth daily. VitaFusion, Disp: , Rfl:    rosuvastatin (CRESTOR) 40 MG tablet, Take 1 tablet (40 mg total) by mouth daily., Disp: 30 tablet, Rfl: 2   atomoxetine (STRATTERA) 80 MG capsule, Take 80 mg by mouth daily. (Patient not taking: Reported on 10/22/2023), Disp: , Rfl:   Past Medical History: Past Medical History:  Diagnosis Date   ADHD    Bipolar 1 disorder (HCC)      Tobacco Use: Social History   Tobacco Use  Smoking Status Never  Smokeless Tobacco Never    Labs: Review Flowsheet       Latest Ref Rng & Units 09/13/2023 09/14/2023  Labs for ITP Cardiac and Pulmonary Rehab  Cholestrol 0 - 200 mg/dL - 366   LDL (calc) 0 - 99 mg/dL - 79   HDL-C >44 mg/dL - 40   Trlycerides <034 mg/dL - 742   Hemoglobin V9D 4.8 - 5.6 % 5.1  -    Details            Capillary Blood Glucose: No results found for: "GLUCAP"   Exercise Target Goals: Exercise Program Goal: Individual exercise prescription set using results from initial 6 min walk test and THRR while considering  patient's activity barriers and safety.   Exercise Prescription Goal: Initial exercise prescription builds to 30-45 minutes a day of aerobic activity, 2-3 days per week.  Home exercise guidelines will be given to patient during program as part of exercise prescription that the participant will acknowledge.  Activity Barriers & Risk Stratification:  Activity Barriers & Cardiac Risk Stratification - 10/22/23 1341       Activity Barriers & Cardiac Risk Stratification   Activity Barriers Shortness of Breath    Cardiac Risk Stratification High   <5 METs on            6 Minute Walk:  6 Minute Walk  Row Name 10/22/23 1340         6 Minute Walk   Phase Initial     Distance 1625 feet     Walk Time 6 minutes     # of Rest Breaks 0     MPH 3.08     METS 4.36     RPE 11     Perceived Dyspnea  1.5     VO2 Peak 15.26     Symptoms Yes (comment)     Comments SOB- resolved with rest. no pain     Resting HR 66 bpm     Resting BP 112/78     Resting Oxygen Saturation  97 %     Exercise Oxygen Saturation  during 6 min walk 100 %     Max Ex. HR 93 bpm     Max Ex. BP 122/82     2 Minute Post BP 114/76              Oxygen Initial Assessment:   Oxygen Re-Evaluation:   Oxygen Discharge (Final Oxygen Re-Evaluation):   Initial Exercise Prescription:   Initial Exercise Prescription - 10/22/23 1300       Date of Initial Exercise RX and Referring Provider   Date 10/22/23    Referring Provider Kristeen Miss, MD    Expected Discharge Date 01/13/24      Elliptical   Level 1    Speed 1    Minutes 15    METs 3.5      Rower   Level 1    Watts 45    Minutes 15    METs 3.5      Prescription Details   Frequency (times per week) 3    Duration Progress to 30 minutes of continuous aerobic without signs/symptoms of physical distress      Intensity   THRR 40-80% of Max Heartrate 70-139    Ratings of Perceived Exertion 11-13    Perceived Dyspnea 0-4      Progression   Progression Continue progressive overload as per policy without signs/symptoms or physical distress.      Resistance Training   Training Prescription Yes    Weight 5    Reps 10-15             Perform Capillary Blood Glucose checks as needed.  Exercise Prescription Changes:   Exercise Comments:   Exercise Goals and Review:   Exercise Goals     Row Name 10/22/23 1029             Exercise Goals   Increase Physical Activity Yes       Intervention Provide advice, education, support and counseling about physical activity/exercise needs.;Develop an individualized exercise prescription for aerobic and resistive training based on initial evaluation findings, risk stratification, comorbidities and participant's personal goals.       Expected Outcomes Short Term: Attend rehab on a regular basis to increase amount of physical activity.;Long Term: Exercising regularly at least 3-5 days a week.;Long Term: Add in home exercise to make exercise part of routine and to increase amount of physical activity.       Increase Strength and Stamina Yes       Intervention Provide advice, education, support and counseling about physical activity/exercise needs.;Develop an individualized exercise prescription for aerobic and resistive training based on initial evaluation  findings, risk stratification, comorbidities and participant's personal goals.       Expected Outcomes Short Term: Increase workloads from initial exercise prescription  for resistance, speed, and METs.;Long Term: Improve cardiorespiratory fitness, muscular endurance and strength as measured by increased METs and functional capacity ( );Short Term: Perform resistance training exercises routinely during rehab and add in resistance training at home       Able to understand and use rate of perceived exertion (RPE) scale Yes       Intervention Provide education and explanation on how to use RPE scale       Expected Outcomes Short Term: Able to use RPE daily in rehab to express subjective intensity level;Long Term:  Able to use RPE to guide intensity level when exercising independently       Knowledge and understanding of Target Heart Rate Range (THRR) Yes       Intervention Provide education and explanation of THRR including how the numbers were predicted and where they are located for reference       Expected Outcomes Short Term: Able to state/look up THRR;Short Term: Able to use daily as guideline for intensity in rehab;Long Term: Able to use THRR to govern intensity when exercising independently       Understanding of Exercise Prescription Yes       Intervention Provide education, explanation, and written materials on patient's individual exercise prescription       Expected Outcomes Short Term: Able to explain program exercise prescription;Long Term: Able to explain home exercise prescription to exercise independently                Exercise Goals Re-Evaluation :   Discharge Exercise Prescription (Final Exercise Prescription Changes):   Nutrition:  Target Goals: Understanding of nutrition guidelines, daily intake of sodium 1500mg , cholesterol 200mg , calories 30% from fat and 7% or less from saturated fats, daily to have 5 or more servings of fruits and vegetables.  Biometrics:  Pre  Biometrics - 10/22/23 1115       Pre Biometrics   Waist Circumference 44.5 inches    Hip Circumference 43.5 inches    Waist to Hip Ratio 1.02 %    Triceps Skinfold 15 mm    % Body Fat 30.4 %    Grip Strength 56 kg    Flexibility 0 in   could not reach   Single Leg Stand 11.32 seconds              Nutrition Therapy Plan and Nutrition Goals:   Nutrition Assessments:  MEDIFICTS Score Key: >=70 Need to make dietary changes  40-70 Heart Healthy Diet <= 40 Therapeutic Level Cholesterol Diet    Picture Your Plate Scores: <40 Unhealthy dietary pattern with much room for improvement. 41-50 Dietary pattern unlikely to meet recommendations for good health and room for improvement. 51-60 More healthful dietary pattern, with some room for improvement.  >60 Healthy dietary pattern, although there may be some specific behaviors that could be improved.    Nutrition Goals Re-Evaluation:   Nutrition Goals Re-Evaluation:   Nutrition Goals Discharge (Final Nutrition Goals Re-Evaluation):   Psychosocial: Target Goals: Acknowledge presence or absence of significant depression and/or stress, maximize coping skills, provide positive support system. Participant is able to verbalize types and ability to use techniques and skills needed for reducing stress and depression.  Initial Review & Psychosocial Screening:  Initial Psych Review & Screening - 10/22/23 1117       Initial Review   Current issues with Current Depression;Current Stress Concerns    Source of Stress Concerns Chronic Illness;Unable to participate in former interests or hobbies    Comments Billy Andersen  shared that he has had some feelings of depression/stress since his MI due to being unable to participate in former activites/hobbies. He says the changes since his MI have been tough to manage, especially some of the medication adjustments like coming off Strattera. Billy Andersen is already establishde with a psychologist and  psychiatrist, and denies any need for further resources at this time.      Family Dynamics   Good Support System? Yes   Billy Andersen has his wife for support     Barriers   Psychosocial barriers to participate in program The patient should benefit from training in stress management and relaxation.      Screening Interventions   Interventions Encouraged to exercise;Provide feedback about the scores to participant;To provide support and resources with identified psychosocial needs    Expected Outcomes Short Term goal: Utilizing psychosocial counselor, staff and physician to assist with identification of specific Stressors or current issues interfering with healing process. Setting desired goal for each stressor or current issue identified.;Long Term Goal: Stressors or current issues are controlled or eliminated.;Short Term goal: Identification and review with participant of any Quality of Life or Depression concerns found by scoring the questionnaire.;Long Term goal: The participant improves quality of Life and PHQ9 Scores as seen by post scores and/or verbalization of changes             Quality of Life Scores:  Quality of Life - 10/22/23 1353       Quality of Life   Select Quality of Life      Quality of Life Scores   Health/Function Pre 12.2 %    Socioeconomic Pre 18 %    Psych/Spiritual Pre 13.93 %    Family Pre 19.2 %    GLOBAL Pre 14.78 %            Scores of 19 and below usually indicate a poorer quality of life in these areas.  A difference of  2-3 points is a clinically meaningful difference.  A difference of 2-3 points in the total score of the Quality of Life Index has been associated with significant improvement in overall quality of life, self-image, physical symptoms, and general health in studies assessing change in quality of life.  PHQ-9: Review Flowsheet       10/22/2023  Depression screen PHQ 2/9  Decreased Interest 1  Down, Depressed, Hopeless 1  PHQ - 2  Score 2  Altered sleeping 1  Tired, decreased energy 1  Change in appetite 1  Feeling bad or failure about yourself  1  Trouble concentrating 1  Moving slowly or fidgety/restless 1  Suicidal thoughts 0  PHQ-9 Score 8  Difficult doing work/chores Somewhat difficult    Details           Interpretation of Total Score  Total Score Depression Severity:  1-4 = Minimal depression, 5-9 = Mild depression, 10-14 = Moderate depression, 15-19 = Moderately severe depression, 20-27 = Severe depression   Psychosocial Evaluation and Intervention:   Psychosocial Re-Evaluation:   Psychosocial Discharge (Final Psychosocial Re-Evaluation):   Vocational Rehabilitation: Provide vocational rehab assistance to qualifying candidates.   Vocational Rehab Evaluation & Intervention:  Vocational Rehab - 10/22/23 1120       Initial Vocational Rehab Evaluation & Intervention   Assessment shows need for Vocational Rehabilitation No   Billy Andersen is working            Education: Education Goals: Education classes will be provided on a weekly basis, covering required topics.  Participant will state understanding/return demonstration of topics presented.     Core Videos: Exercise    Move It!  Clinical staff conducted group or individual video education with verbal and written material and guidebook.  Patient learns the recommended Pritikin exercise program. Exercise with the goal of living a long, healthy life. Some of the health benefits of exercise include controlled diabetes, healthier blood pressure levels, improved cholesterol levels, improved heart and lung capacity, improved sleep, and better body composition. Everyone should speak with their doctor before starting or changing an exercise routine.  Biomechanical Limitations Clinical staff conducted group or individual video education with verbal and written material and guidebook.  Patient learns how biomechanical limitations can impact  exercise and how we can mitigate and possibly overcome limitations to have an impactful and balanced exercise routine.  Body Composition Clinical staff conducted group or individual video education with verbal and written material and guidebook.  Patient learns that body composition (ratio of muscle mass to fat mass) is a key component to assessing overall fitness, rather than body weight alone. Increased fat mass, especially visceral belly fat, can put [place] at increased risk for metabolic syndrome, type 2 diabetes, heart disease, and even death. It is recommended to combine diet and exercise (cardiovascular and resistance training) to improve your body composition. Seek guidance from your physician and exercise physiologist before implementing an exercise routine.  Exercise Action Plan Clinical staff conducted group or individual video education with verbal and written material and guidebook.  Patient learns the recommended strategies to achieve and enjoy long-term exercise adherence, including variety, self-motivation, self-efficacy, and positive decision making. Benefits of exercise include fitness, good health, weight management, more energy, better sleep, less stress, and overall well-being.  Medical   Heart Disease Risk Reduction Clinical staff conducted group or individual video education with verbal and written material and guidebook.  Patient learns our heart is our most vital organ as it circulates oxygen, nutrients, white blood cells, and hormones throughout the entire body, and carries waste away. Data supports a plant-based eating plan like the Pritikin Program for its effectiveness in slowing progression of and reversing heart disease. The video provides a number of recommendations to address heart disease.   Metabolic Syndrome and Belly Fat  Clinical staff conducted group or individual video education with verbal and written material and guidebook.  Patient learns what metabolic  syndrome is, how it leads to heart disease, and how one can reverse it and keep it from coming back. You have metabolic syndrome if you have 3 of the following 5 criteria: abdominal obesity, high blood pressure, high triglycerides, low HDL cholesterol, and high blood sugar.  Hypertension and Heart Disease Clinical staff conducted group or individual video education with verbal and written material and guidebook.  Patient learns that high blood pressure, or hypertension, is very common in the [place]. Hypertension is largely due to excessive salt intake, but other important risk factors include being overweight, physical inactivity, drinking too much alcohol, smoking, and not eating enough potassium from fruits and vegetables. High blood pressure is a leading risk factor for heart attack, stroke, congestive heart failure, dementia, kidney failure, and premature death. Long-term effects of excessive salt intake include stiffening of the arteries and thickening of heart muscle and organ damage. Recommendations include ways to reduce hypertension and the risk of heart disease.  Diseases of Our Time - Focusing on Diabetes Clinical staff conducted group or individual video education with verbal and written material and guidebook.  Patient learns why the best way to stop diseases of our time is prevention, through food and other lifestyle changes. Medicine (such as prescription pills and surgeries) is often only a Band-Aid on the problem, not a long-term solution. Most common diseases of our time include obesity, type 2 diabetes, hypertension, heart disease, and cancer. The Pritikin Program is recommended and has been proven to help reduce, reverse, and/or prevent the damaging effects of metabolic syndrome.  Nutrition   Overview of the Pritikin Eating Plan  Clinical staff conducted group or individual video education with verbal and written material and guidebook.  Patient learns about the Pritikin  Eating Plan for disease risk reduction. The Pritikin Eating Plan emphasizes a wide variety of unrefined, minimally-processed carbohydrates, like fruits, vegetables, whole grains, and legumes. Go, Caution, and Stop food choices are explained. Plant-based and lean animal proteins are emphasized. Rationale provided for low sodium intake for blood pressure control, low added sugars for blood sugar stabilization, and low added fats and oils for coronary artery disease risk reduction and weight management.  Calorie Density  Clinical staff conducted group or individual video education with verbal and written material and guidebook.  Patient learns about calorie density and how it impacts the Pritikin Eating Plan. Knowing the characteristics of the food you choose will help you decide whether those foods will lead to weight gain or weight loss, and whether you want to consume more or less of them. Weight loss is usually a side effect of the Pritikin Eating Plan because of its focus on low calorie-dense foods.  Label Reading  Clinical staff conducted group or individual video education with verbal and written material and guidebook.  Patient learns about the Pritikin recommended label reading guidelines and corresponding recommendations regarding calorie density, added sugars, sodium content, and whole grains.  Dining Out - Part 1  Clinical staff conducted group or individual video education with verbal and written material and guidebook.  Patient learns that restaurant meals can be sabotaging because they can be so high in calories, fat, sodium, and/or sugar. Patient learns recommended strategies on how to positively address this and avoid unhealthy pitfalls.  Facts on Fats  Clinical staff conducted group or individual video education with verbal and written material and guidebook.  Patient learns that lifestyle modifications can be just as effective, if not more so, as many medications for lowering your  risk of heart disease. A Pritikin lifestyle can help to reduce your risk of inflammation and atherosclerosis (cholesterol build-up, or plaque, in the artery walls). Lifestyle interventions such as dietary choices and physical activity address the cause of atherosclerosis. A review of the types of fats and their impact on blood cholesterol levels, along with dietary recommendations to reduce fat intake is also included.  Nutrition Action Plan  Clinical staff conducted group or individual video education with verbal and written material and guidebook.  Patient learns how to incorporate Pritikin recommendations into their lifestyle. Recommendations include planning and keeping personal health goals in mind as an important part of their success.  Healthy Mind-Set    Healthy Minds, Bodies, Hearts  Clinical staff conducted group or individual video education with verbal and written material and guidebook.  Patient learns how to identify when they are stressed. Video will discuss the impact of that stress, as well as the many benefits of stress management. Patient will also be introduced to stress management techniques. The way we think, act, and feel has an impact on our hearts.  How Our Thoughts  Can Heal Our Hearts  Clinical staff conducted group or individual video education with verbal and written material and guidebook.  Patient learns that negative thoughts can cause depression and anxiety. This can result in negative lifestyle behavior and serious health problems. Cognitive behavioral therapy is an effective method to help control our thoughts in order to change and improve our emotional outlook.  Additional Videos:  Exercise    Improving Performance  Clinical staff conducted group or individual video education with verbal and written material and guidebook.  Patient learns to use a non-linear approach by alternating intensity levels and lengths of time spent exercising to help burn more calories  and lose more body fat. Cardiovascular exercise helps improve heart health, metabolism, hormonal balance, blood sugar control, and recovery from fatigue. Resistance training improves strength, endurance, balance, coordination, reaction time, metabolism, and muscle mass. Flexibility exercise improves circulation, posture, and balance. Seek guidance from your physician and exercise physiologist before implementing an exercise routine and learn your capabilities and proper form for all exercise.  Introduction to Yoga  Clinical staff conducted group or individual video education with verbal and written material and guidebook.  Patient learns about yoga, a discipline of the coming together of mind, breath, and body. The benefits of yoga include improved flexibility, improved range of motion, better posture and core strength, increased lung function, weight loss, and positive self-image. Yoga's heart health benefits include lowered blood pressure, healthier heart rate, decreased cholesterol and triglyceride levels, improved immune function, and reduced stress. Seek guidance from your physician and exercise physiologist before implementing an exercise routine and learn your capabilities and proper form for all exercise.  Medical   Aging: Enhancing Your Quality of Life  Clinical staff conducted group or individual video education with verbal and written material and guidebook.  Patient learns key strategies and recommendations to stay in good physical health and enhance quality of life, such as prevention strategies, having an advocate, securing a Health Care Proxy and Power of Attorney, and keeping a list of medications and system for tracking them. It also discusses how to avoid risk for bone loss.  Biology of Weight Control  Clinical staff conducted group or individual video education with verbal and written material and guidebook.  Patient learns that weight gain occurs because we consume more calories than  we burn (eating more, moving less). Even if your body weight is normal, you may have higher ratios of fat compared to muscle mass. Too much body fat puts you at increased risk for cardiovascular disease, heart attack, stroke, type 2 diabetes, and obesity-related cancers. In addition to exercise, following the Pritikin Eating Plan can help reduce your risk.  Decoding Lab Results  Clinical staff conducted group or individual video education with verbal and written material and guidebook.  Patient learns that lab test reflects one measurement whose values change over time and are influenced by many factors, including medication, stress, sleep, exercise, food, hydration, pre-existing medical conditions, and more. It is recommended to use the knowledge from this video to become more involved with your lab results and evaluate your numbers to speak with your doctor.   Diseases of Our Time - Overview  Clinical staff conducted group or individual video education with verbal and written material and guidebook.  Patient learns that according to the CDC, 50% to 70% of chronic diseases (such as obesity, type 2 diabetes, elevated lipids, hypertension, and heart disease) are avoidable through lifestyle improvements including healthier food choices, listening to satiety cues, and  increased physical activity.  Sleep Disorders Clinical staff conducted group or individual video education with verbal and written material and guidebook.  Patient learns how good quality and duration of sleep are important to overall health and well-being. Patient also learns about sleep disorders and how they impact health along with recommendations to address them, including discussing with a physician.  Nutrition  Dining Out - Part 2 Clinical staff conducted group or individual video education with verbal and written material and guidebook.  Patient learns how to plan ahead and communicate in order to maximize their dining experience  in a healthy and nutritious manner. Included are recommended food choices based on the type of restaurant the patient is visiting.   Fueling a Banker conducted group or individual video education with verbal and written material and guidebook.  There is a strong connection between our food choices and our health. Diseases like obesity and type 2 diabetes are very prevalent and are in large-part due to lifestyle choices. The Pritikin Eating Plan provides plenty of food and hunger-curbing satisfaction. It is easy to follow, affordable, and helps reduce health risks.  Menu Workshop  Clinical staff conducted group or individual video education with verbal and written material and guidebook.  Patient learns that restaurant meals can sabotage health goals because they are often packed with calories, fat, sodium, and sugar. Recommendations include strategies to plan ahead and to communicate with the manager, chef, or server to help order a healthier meal.  Planning Your Eating Strategy  Clinical staff conducted group or individual video education with verbal and written material and guidebook.  Patient learns about the Pritikin Eating Plan and its benefit of reducing the risk of disease. The Pritikin Eating Plan does not focus on calories. Instead, it emphasizes high-quality, nutrient-rich foods. By knowing the characteristics of the foods, we choose, we can determine their calorie density and make informed decisions.  Targeting Your Nutrition Priorities  Clinical staff conducted group or individual video education with verbal and written material and guidebook.  Patient learns that lifestyle habits have a tremendous impact on disease risk and progression. This video provides eating and physical activity recommendations based on your personal health goals, such as reducing LDL cholesterol, losing weight, preventing or controlling type 2 diabetes, and reducing high blood  pressure.  Vitamins and Minerals  Clinical staff conducted group or individual video education with verbal and written material and guidebook.  Patient learns different ways to obtain key vitamins and minerals, including through a recommended healthy diet. It is important to discuss all supplements you take with your doctor.   Healthy Mind-Set    Smoking Cessation  Clinical staff conducted group or individual video education with verbal and written material and guidebook.  Patient learns that cigarette smoking and tobacco addiction pose a serious health risk which affects millions of people. Stopping smoking will significantly reduce the risk of heart disease, lung disease, and many forms of cancer. Recommended strategies for quitting are covered, including working with your doctor to develop a successful plan.  Culinary   Becoming a Set designer conducted group or individual video education with verbal and written material and guidebook.  Patient learns that cooking at home can be healthy, cost-effective, quick, and puts them in control. Keys to cooking healthy recipes will include looking at your recipe, assessing your equipment needs, planning ahead, making it simple, choosing cost-effective seasonal ingredients, and limiting the use of added fats, salts, and sugars.  Cooking - Breakfast and Snacks  Clinical staff conducted group or individual video education with verbal and written material and guidebook.  Patient learns how important breakfast is to satiety and nutrition through the entire day. Recommendations include key foods to eat during breakfast to help stabilize blood sugar levels and to prevent overeating at meals later in the day. Planning ahead is also a key component.  Cooking - Educational psychologist conducted group or individual video education with verbal and written material and guidebook.  Patient learns eating strategies to improve overall  health, including an approach to cook more at home. Recommendations include thinking of animal protein as a side on your plate rather than center stage and focusing instead on lower calorie dense options like vegetables, fruits, whole grains, and plant-based proteins, such as beans. Making sauces in large quantities to freeze for later and leaving the skin on your vegetables are also recommended to maximize your experience.  Cooking - Healthy Salads and Dressing Clinical staff conducted group or individual video education with verbal and written material and guidebook.  Patient learns that vegetables, fruits, whole grains, and legumes are the foundations of the Pritikin Eating Plan. Recommendations include how to incorporate each of these in flavorful and healthy salads, and how to create homemade salad dressings. Proper handling of ingredients is also covered. Cooking - Soups and [employer] - Soups and Desserts Clinical staff conducted group or individual video education with verbal and written material and guidebook.  Patient learns that Pritikin soups and desserts make for easy, nutritious, and delicious snacks and meal components that are low in sodium, fat, sugar, and calorie density, while high in vitamins, minerals, and filling fiber. Recommendations include simple and healthy ideas for soups and desserts.   Overview     The Pritikin Solution Program Overview Clinical staff conducted group or individual video education with verbal and written material and guidebook.  Patient learns that the results of the Pritikin Program have been documented in more than 100 articles published in peer-reviewed journals, and the benefits include reducing risk factors for (and, in some cases, even reversing) high cholesterol, high blood pressure, type 2 diabetes, obesity, and more! An overview of the three key pillars of the Pritikin Program will be covered: eating well, doing regular exercise, and having a  healthy mind-set.  WORKSHOPS  Exercise: Exercise Basics: Building Your Action Plan Clinical staff led group instruction and group discussion with PowerPoint presentation and patient guidebook. To enhance the learning environment the use of posters, models and videos may be added. At the conclusion of this workshop, patients will comprehend the difference between physical activity and exercise, as well as the benefits of incorporating both, into their routine. Patients will understand the FITT (Frequency, Intensity, Time, and Type) principle and how to use it to build an exercise action plan. In addition, safety concerns and other considerations for exercise and cardiac rehab will be addressed by the presenter. The purpose of this lesson is to promote a comprehensive and effective weekly exercise routine in order to improve patients' overall level of fitness.   Managing Heart Disease: Your Path to a Healthier Heart Clinical staff led group instruction and group discussion with PowerPoint presentation and patient guidebook. To enhance the learning environment the use of posters, models and videos may be added.At the conclusion of this workshop, patients will understand the anatomy and physiology of the heart. Additionally, they will understand how Pritikin's three pillars impact the risk factors,  the progression, and the management of heart disease.  The purpose of this lesson is to provide a high-level overview of the heart, heart disease, and how the Pritikin lifestyle positively impacts risk factors.  Exercise Biomechanics Clinical staff led group instruction and group discussion with PowerPoint presentation and patient guidebook. To enhance the learning environment the use of posters, models and videos may be added. Patients will learn how the structural parts of their bodies function and how these functions impact their daily activities, movement, and exercise. Patients will learn how to  promote a neutral spine, learn how to manage pain, and identify ways to improve their physical movement in order to promote healthy living. The purpose of this lesson is to expose patients to common physical limitations that impact physical activity. Participants will learn practical ways to adapt and manage aches and pains, and to minimize their effect on regular exercise. Patients will learn how to maintain good posture while sitting, walking, and lifting.  Balance Training and Fall Prevention  Clinical staff led group instruction and group discussion with PowerPoint presentation and patient guidebook. To enhance the learning environment the use of posters, models and videos may be added. At the conclusion of this workshop, patients will understand the importance of their sensorimotor skills (vision, proprioception, and the vestibular system) in maintaining their ability to balance as they age. Patients will apply a variety of balancing exercises that are appropriate for their current level of function. Patients will understand the common causes for poor balance, possible solutions to these problems, and ways to modify their physical environment in order to minimize their fall risk. The purpose of this lesson is to teach patients about the importance of maintaining balance as they age and ways to minimize their risk of falling.  WORKSHOPS   Nutrition:  Fueling a Ship broker led group instruction and group discussion with PowerPoint presentation and patient guidebook. To enhance the learning environment the use of posters, models and videos may be added. Patients will review the foundational principles of the Pritikin Eating Plan and understand what constitutes a serving size in each of the food groups. Patients will also learn Pritikin-friendly foods that are better choices when away from home and review make-ahead meal and snack options. Calorie density will be reviewed and  applied to three nutrition priorities: weight maintenance, weight loss, and weight gain. The purpose of this lesson is to reinforce (in a group setting) the key concepts around what patients are recommended to eat and how to apply these guidelines when away from home by planning and selecting Pritikin-friendly options. Patients will understand how calorie density may be adjusted for different weight management goals.  Mindful Eating  Clinical staff led group instruction and group discussion with PowerPoint presentation and patient guidebook. To enhance the learning environment the use of posters, models and videos may be added. Patients will briefly review the concepts of the Pritikin Eating Plan and the importance of low-calorie dense foods. The concept of mindful eating will be introduced as well as the importance of paying attention to internal hunger signals. Triggers for non-hunger eating and techniques for dealing with triggers will be explored. The purpose of this lesson is to provide patients with the opportunity to review the basic principles of the Pritikin Eating Plan, discuss the value of eating mindfully and how to measure internal cues of hunger and fullness using the Hunger Scale. Patients will also discuss reasons for non-hunger eating and learn strategies to use for controlling  emotional eating.  Targeting Your Nutrition Priorities Clinical staff led group instruction and group discussion with PowerPoint presentation and patient guidebook. To enhance the learning environment the use of posters, models and videos may be added. Patients will learn how to determine their genetic susceptibility to disease by reviewing their family history. Patients will gain insight into the importance of diet as part of an overall healthy lifestyle in mitigating the impact of genetics and other environmental insults. The purpose of this lesson is to provide patients with the opportunity to assess their personal  nutrition priorities by looking at their family history, their own health history and current risk factors. Patients will also be able to discuss ways of prioritizing and modifying the Pritikin Eating Plan for their highest risk areas  Menu  Clinical staff led group instruction and group discussion with PowerPoint presentation and patient guidebook. To enhance the learning environment the use of posters, models and videos may be added. Using menus brought in from [employer], or printed from [employer], patients will apply the Pritikin dining out guidelines that were presented in the [employer] video. Patients will also be able to practice these guidelines in a variety of provided scenarios. The purpose of this lesson is to provide patients with the opportunity to practice hands-on learning of the Pritikin Dining Out guidelines with actual menus and practice scenarios.  Label Reading Clinical staff led group instruction and group discussion with PowerPoint presentation and patient guidebook. To enhance the learning environment the use of posters, models and videos may be added. Patients will review and discuss the Pritikin label reading guidelines presented in Pritikin's Label Reading Educational series video. Using fool labels brought in from local grocery stores and markets, patients will apply the label reading guidelines and determine if the packaged food meet the Pritikin guidelines. The purpose of this lesson is to provide patients with the opportunity to review, discuss, and practice hands-on learning of the Pritikin Label Reading guidelines with actual packaged food labels. Cooking School  Pritikin's [employer] are designed to teach patients ways to prepare quick, simple, and affordable recipes at home. The importance of nutrition's role in chronic disease risk reduction is reflected in its emphasis in the overall Pritikin program. By learning how to prepare  essential core Pritikin Eating Plan recipes, patients will increase control over what they eat; be able to customize the flavor of foods without the use of added salt, sugar, or fat; and improve the quality of the food they consume. By learning a set of core recipes which are easily assembled, quickly prepared, and affordable, patients are more likely to prepare more healthy foods at home. These workshops focus on convenient breakfasts, simple entres, side dishes, and desserts which can be prepared with minimal effort and are consistent with nutrition recommendations for cardiovascular risk reduction. Cooking [employer] are taught by a Armed forces logistics/support/administrative officer (RD) who has been trained by the [employer]. The chef or RD has a clear understanding of the importance of minimizing - if not completely eliminating - added fat, sugar, and sodium in recipes. Throughout the series of Cooking School Workshop sessions, patients will learn about healthy ingredients and efficient methods of cooking to build confidence in their capability to prepare    Cooking School weekly topics:  Adding Flavor- Sodium-Free  Fast and Healthy Breakfasts  Powerhouse Plant-Based Proteins  Satisfying Salads and Dressings  Simple Sides and Sauces  International Cuisine-Spotlight on the Blue Zones  Delicious Desserts  Diplomatic Services operational officer - Meals in a Hydrologist and Snacks  Comforting Weekend Breakfasts  One-Pot Wonders   Fast [employer] Your Pritikin Plate  WORKSHOPS   Healthy Mindset (Psychosocial):  Focused Goals, Sustainable Changes Clinical staff led group instruction and group discussion with PowerPoint presentation and patient guidebook. To enhance the learning environment the use of posters, models and videos may be added. Patients will be able to apply effective goal setting strategies to establish at least one personal goal, and  then take consistent, meaningful action toward that goal. They will learn to identify common barriers to achieving personal goals and develop strategies to overcome them. Patients will also gain an understanding of how our mind-set can impact our ability to achieve goals and the importance of cultivating a positive and growth-oriented mind-set. The purpose of this lesson is to provide patients with a deeper understanding of how to set and achieve personal goals, as well as the tools and strategies needed to overcome common obstacles which may arise along the way.  From Head to Heart: The Power of a Healthy Outlook  Clinical staff led group instruction and group discussion with PowerPoint presentation and patient guidebook. To enhance the learning environment the use of posters, models and videos may be added. Patients will be able to recognize and describe the impact of emotions and mood on physical health. They will discover the importance of self-care and explore self-care practices which may work for them. Patients will also learn how to utilize the 4 C's to cultivate a healthier outlook and better manage stress and challenges. The purpose of this lesson is to demonstrate to patients how a healthy outlook is an essential part of maintaining good health, especially as they continue their cardiac rehab journey.  Healthy Sleep for a Healthy Heart Clinical staff led group instruction and group discussion with PowerPoint presentation and patient guidebook. To enhance the learning environment the use of posters, models and videos may be added. At the conclusion of this workshop, patients will be able to demonstrate knowledge of the importance of sleep to overall health, well-being, and quality of life. They will understand the symptoms of, and treatments for, common sleep disorders. Patients will also be able to identify daytime and nighttime behaviors which impact sleep, and they will be able to apply these  tools to help manage sleep-related challenges. The purpose of this lesson is to provide patients with a general overview of sleep and outline the importance of quality sleep. Patients will learn about a few of the most common sleep disorders. Patients will also be introduced to the concept of "sleep hygiene," and discover ways to self-manage certain sleeping problems through simple daily behavior changes. Finally, the workshop will motivate patients by clarifying the links between quality sleep and their goals of heart-healthy living.   Recognizing and Reducing Stress Clinical staff led group instruction and group discussion with PowerPoint presentation and patient guidebook. To enhance the learning environment the use of posters, models and videos may be added. At the conclusion of this workshop, patients will be able to understand the types of stress reactions, differentiate between acute and chronic stress, and recognize the impact that chronic stress has on their health. They will also be able to apply different coping mechanisms, such as reframing negative self-talk. Patients will have the opportunity to practice a variety of stress management techniques, such as deep abdominal breathing, progressive muscle relaxation, and/or  guided imagery.  The purpose of this lesson is to educate patients on the role of stress in their lives and to provide healthy techniques for coping with it.  Learning Barriers/Preferences:  Learning Barriers/Preferences - 10/22/23 1120       Learning Barriers/Preferences   Learning Barriers None    Learning Preferences Computer/Internet;Group Instruction;Individual Instruction;Skilled Demonstration;Verbal Instruction;Video             Education Topics:  Knowledge Questionnaire Score:  Knowledge Questionnaire Score - 10/22/23 1123       Knowledge Questionnaire Score   Pre Score 21/24             Core Components/Risk Factors/Patient Goals at Admission:   Personal Goals and Risk Factors at Admission - 10/22/23 1120       Core Components/Risk Factors/Patient Goals on Admission    Weight Management Yes;Obesity;Weight Loss    Intervention Weight Management: Develop a combined nutrition and exercise program designed to reach desired caloric intake, while maintaining appropriate intake of nutrient and fiber, sodium and fats, and appropriate energy expenditure required for the weight goal.;Weight Management: Provide education and appropriate resources to help participant work on and attain dietary goals.;Weight Management/Obesity: Establish reasonable short term and long term weight goals.;Obesity: Provide education and appropriate resources to help participant work on and attain dietary goals.    Expected Outcomes Understanding of distribution of calorie intake throughout the day with the consumption of 4-5 meals/snacks;Understanding recommendations for meals to include 15-35% energy as protein, 25-35% energy from fat, 35-60% energy from carbohydrates, less than 200mg  of dietary cholesterol, 20-35 gm of total fiber daily;Weight Loss: Understanding of general recommendations for a balanced deficit meal plan, which promotes 1-2 lb weight loss per week and includes a negative energy balance of [phone] kcal/d;Long Term: Adherence to nutrition and physical activity/exercise program aimed toward attainment of established weight goal;Short Term: Continue to assess and modify interventions until short term weight is achieved    Lipids Yes    Intervention Provide education and support for participant on nutrition & aerobic/resistive exercise along with prescribed medications to achieve LDL 70mg , HDL >40mg .    Expected Outcomes Short Term: Participant states understanding of desired cholesterol values and is compliant with medications prescribed. Participant is following exercise prescription and nutrition guidelines.;Long Term: Cholesterol controlled with medications as  prescribed, with individualized exercise RX and with personalized nutrition plan. Value goals: LDL < 70mg , HDL > 40 mg.    Stress Yes    Intervention Offer individual and/or small group education and counseling on adjustment to heart disease, stress management and health-related lifestyle change. Teach and support self-help strategies.;Refer participants experiencing significant psychosocial distress to appropriate mental health specialists for further evaluation and treatment. When possible, include family members and significant others in education/counseling sessions.    Expected Outcomes Short Term: Participant demonstrates changes in health-related behavior, relaxation and other stress management skills, ability to obtain effective social support, and compliance with psychotropic medications if prescribed.;Long Term: Emotional wellbeing is indicated by absence of clinically significant psychosocial distress or social isolation.             Core Components/Risk Factors/Patient Goals Review:    Core Components/Risk Factors/Patient Goals at Discharge (Final Review):    ITP Comments:  ITP Comments     Row Name 10/22/23 1029           ITP Comments Dr. Armanda Magic medical director. Introduction to pritikin education/intensive cardiac rehab. Initial orientation packet reviewed with patient.  Comments: Participant attended orientation for the cardiac rehabilitation program on  10/22/2023  to perform initial intake and exercise walk test. Patient introduced to the Pritikin Program education and orientation packet was reviewed. Completed 6-minute walk test, measurements, initial ITP, and exercise prescription. Vital signs stable. Telemetry-normal sinus rhythm, asymptomatic.   Service time was from 1030 to 1202.   Jonna Coup, MS, ACSM-CEP 10/22/2023 1:55 PM

## 2023-10-22 NOTE — Progress Notes (Signed)
Cardiac Rehab Medication Review   Does the patient  feel that his/her medications are working for him/her?  YES   Has the patient been experiencing any side effects to the medications prescribed?  NO  Does the patient measure his/her own blood pressure or blood glucose at home?  YES   Does the patient have any problems obtaining medications due to transportation or finances?   NO  Understanding of regimen: excellent Understanding of indications: excellent Potential of compliance: excellent    Comments: Billy Andersen understands his medications and regime well. He checks his BP nightly. He has experienced some dizziness since stopping Staterra, he states this has happened in the past when he would miss a dose.     Jonna Coup, MS, ACSM-CEP 10/22/2023 11:21 AM

## 2023-10-26 ENCOUNTER — Encounter (HOSPITAL_COMMUNITY)
Admission: RE | Admit: 2023-10-26 | Discharge: 2023-10-26 | Disposition: A | Discharge status: Home / Self Care | Payer: Medicaid Other | Source: Ambulatory Visit | Attending: Cardiovascular Disease | Admitting: Cardiovascular Disease

## 2023-10-26 ENCOUNTER — Ambulatory Visit (HOSPITAL_COMMUNITY): Payer: Medicaid Other

## 2023-10-26 DIAGNOSIS — I214 Non-ST elevation (NSTEMI) myocardial infarction: Secondary | ICD-10-CM

## 2023-10-26 DIAGNOSIS — Z955 Presence of coronary angioplasty implant and graft: Secondary | ICD-10-CM | POA: Diagnosis not present

## 2023-10-26 NOTE — Progress Notes (Addendum)
Daily Session Note  Patient Details  Name: Billy Andersen MRN: [id number] Date of Birth: 07-Jul-1976 Referring Provider:   Flowsheet Row CARDIAC REHAB PHASE II ORIENTATION from 10/22/2023 in [facility]  Referring Provider Kristeen Miss, MD       Encounter Date: 10/26/2023  Check In:  Session Check In - 10/26/23 1241       Check-In   Supervising physician immediately available to respond to emergencies [facility] MD immediately available    Physician(s) Jari Favre PA    Location [facility]    Staff Present Lorin Picket, MS, ACSM-CEP, CCRP, Exercise Physiologist;Olinty Peggye Pitt, MS, ACSM-CEP, Exercise Physiologist;Johnny Hale Bogus, MS, Exercise Physiologist;Jetta Dan Humphreys BS, ACSM-CEP, Exercise Physiologist;Bell Carbo, RN, BSN    Virtual Visit No    Medication changes reported     No    Fall or balance concerns reported    No    Tobacco Cessation No Change    Warm-up and Cool-down Performed as group-led instruction    Resistance Training Performed Yes    VAD Patient? No    PAD/SET Patient? No      Pain Assessment   Currently in Pain? No/denies    Pain Score 0-No pain    Multiple Pain Sites No             Capillary Blood Glucose: No results found for this or any previous visit (from the past 24 hour(s)).   Exercise Prescription Changes - 10/26/23 1400       Response to Exercise   Blood Pressure (Admit) 124/82    Blood Pressure (Exercise) 128/82    Blood Pressure (Exit) 114/72    Heart Rate (Admit) 79 bpm    Heart Rate (Exercise) 144 bpm    Heart Rate (Exit) 89 bpm    Rating of Perceived Exertion (Exercise) 10    Symptoms None    Comments Pt's first day in the CRP2 program    Duration Continue with 30 min of aerobic exercise without signs/symptoms of physical distress.    Intensity THRR unchanged      Progression   Progression Continue to progress workloads to maintain intensity  without signs/symptoms of physical distress.    Average METs 4.5      Resistance Training   Training Prescription Yes    Weight 5    Reps 10-15    Time 10 Minutes      Interval Training   Interval Training No      Elliptical   Level 1    Speed 1    Minutes 15    METs 4.6      Rower   Level 1    Watts 29    Minutes 15    METs 4.46             Social History   Tobacco Use  Smoking Status Never  Smokeless Tobacco Never    Goals Met:  Exercise tolerated well No report of concerns or symptoms today Strength training completed today  Goals Unmet:  Not Applicable  Comments:Pt started cardiac rehab today.  Pt tolerated light exercise without difficulty. VSS, telemetry-Sinus rhythm, asymptomatic.  Medication list reconciled. Pt denies barriers to medicaiton compliance.  PSYCHOSOCIAL ASSESSMENT:  PHQ-8.  Will review quality of life questionnaire and PHQ2-9 in the upcoming week.   Pt enjoys spending time with children and video games.   Pt oriented to exercise equipment and routine.    Understanding  verbalized. Thayer Headings RN BSN    Dr. Armanda Magic is Medical Director for Cardiac Rehab at [facility].

## 2023-10-28 ENCOUNTER — Ambulatory Visit (HOSPITAL_COMMUNITY): Payer: Medicaid Other

## 2023-10-28 ENCOUNTER — Encounter (HOSPITAL_COMMUNITY)
Admission: RE | Admit: 2023-10-28 | Discharge: 2023-10-28 | Disposition: A | Discharge status: Home / Self Care | Payer: Medicaid Other | Source: Ambulatory Visit | Attending: Cardiovascular Disease | Admitting: Cardiovascular Disease

## 2023-10-28 DIAGNOSIS — Z955 Presence of coronary angioplasty implant and graft: Secondary | ICD-10-CM | POA: Diagnosis not present

## 2023-10-28 DIAGNOSIS — I214 Non-ST elevation (NSTEMI) myocardial infarction: Secondary | ICD-10-CM

## 2023-10-30 ENCOUNTER — Ambulatory Visit (HOSPITAL_COMMUNITY): Payer: Medicaid Other

## 2023-10-30 ENCOUNTER — Encounter (HOSPITAL_COMMUNITY): Payer: Medicaid Other

## 2023-11-02 ENCOUNTER — Ambulatory Visit (HOSPITAL_COMMUNITY): Payer: Medicaid Other

## 2023-11-02 ENCOUNTER — Encounter (HOSPITAL_COMMUNITY)
Admission: RE | Admit: 2023-11-02 | Discharge: 2023-11-02 | Disposition: A | Discharge status: Home / Self Care | Payer: Medicaid Other | Source: Ambulatory Visit | Attending: Cardiovascular Disease | Admitting: Cardiovascular Disease

## 2023-11-02 DIAGNOSIS — Z48812 Encounter for surgical aftercare following surgery on the circulatory system: Secondary | ICD-10-CM | POA: Diagnosis not present

## 2023-11-02 DIAGNOSIS — I214 Non-ST elevation (NSTEMI) myocardial infarction: Secondary | ICD-10-CM

## 2023-11-02 DIAGNOSIS — I252 Old myocardial infarction: Secondary | ICD-10-CM | POA: Insufficient documentation

## 2023-11-02 DIAGNOSIS — Z955 Presence of coronary angioplasty implant and graft: Secondary | ICD-10-CM | POA: Diagnosis present

## 2023-11-03 ENCOUNTER — Other Ambulatory Visit: Payer: Self-pay | Admitting: Cardiology

## 2023-11-04 ENCOUNTER — Ambulatory Visit (HOSPITAL_COMMUNITY): Payer: Medicaid Other

## 2023-11-04 ENCOUNTER — Encounter (HOSPITAL_COMMUNITY): Payer: Medicaid Other

## 2023-11-06 ENCOUNTER — Ambulatory Visit (HOSPITAL_COMMUNITY): Payer: Medicaid Other

## 2023-11-06 ENCOUNTER — Encounter (HOSPITAL_COMMUNITY)
Admission: RE | Admit: 2023-11-06 | Discharge: 2023-11-06 | Disposition: A | Discharge status: Home / Self Care | Payer: Medicaid Other | Source: Ambulatory Visit | Attending: Cardiovascular Disease | Admitting: Cardiovascular Disease

## 2023-11-06 DIAGNOSIS — I214 Non-ST elevation (NSTEMI) myocardial infarction: Secondary | ICD-10-CM

## 2023-11-06 DIAGNOSIS — Z955 Presence of coronary angioplasty implant and graft: Secondary | ICD-10-CM | POA: Diagnosis not present

## 2023-11-09 ENCOUNTER — Ambulatory Visit (HOSPITAL_COMMUNITY): Payer: Medicaid Other

## 2023-11-09 ENCOUNTER — Encounter (HOSPITAL_COMMUNITY)
Admission: RE | Admit: 2023-11-09 | Discharge: 2023-11-09 | Disposition: A | Discharge status: Home / Self Care | Payer: Medicaid Other | Source: Ambulatory Visit | Attending: Cardiovascular Disease | Admitting: Cardiovascular Disease

## 2023-11-09 DIAGNOSIS — Z955 Presence of coronary angioplasty implant and graft: Secondary | ICD-10-CM | POA: Diagnosis not present

## 2023-11-09 DIAGNOSIS — I214 Non-ST elevation (NSTEMI) myocardial infarction: Secondary | ICD-10-CM

## 2023-11-09 NOTE — Progress Notes (Signed)
Daily Session Note  Patient Details  Name: Billy Andersen MRN: [id number] Date of Birth: [birth year] Referring Provider:   Flowsheet Row CARDIAC REHAB PHASE II ORIENTATION from 10/22/2023 in [facility]  Referring Provider Kristeen Miss, MD       Encounter Date: 11/09/2023   Comments: RN followed up on PHQ-9 score (8)and QOL scores (below average). Pt admits to feeling depressed/down post his cardiac event. Pt stated he has a hx of bipolar takes mood stabilizers and sees a therapist regularly. RN encouraged to reach out to staff if he needs additional resources or a referral. Pt verbalizes understanding.

## 2023-11-11 ENCOUNTER — Ambulatory Visit (HOSPITAL_COMMUNITY): Payer: Medicaid Other

## 2023-11-11 ENCOUNTER — Encounter (HOSPITAL_COMMUNITY)
Admission: RE | Admit: 2023-11-11 | Discharge: 2023-11-11 | Disposition: A | Discharge status: Home / Self Care | Payer: Medicaid Other | Source: Ambulatory Visit | Attending: Cardiovascular Disease | Admitting: Cardiovascular Disease

## 2023-11-11 DIAGNOSIS — Z955 Presence of coronary angioplasty implant and graft: Secondary | ICD-10-CM | POA: Diagnosis not present

## 2023-11-11 DIAGNOSIS — I214 Non-ST elevation (NSTEMI) myocardial infarction: Secondary | ICD-10-CM

## 2023-11-11 NOTE — Progress Notes (Signed)
Cardiac Individual Treatment Plan  Patient Details  Name: Manu Landavazo MRN: [id number] Date of Birth: [birth year] Referring Provider:   Flowsheet Row CARDIAC REHAB PHASE II ORIENTATION from 10/22/2023 in [facility]  Referring Provider Kristeen Miss, MD       Initial Encounter Date:  Flowsheet Row CARDIAC REHAB PHASE II ORIENTATION from 10/22/2023 in [facility]  Date 10/22/23       Visit Diagnosis: 09/13/23 NSTEMI (non-ST elevated myocardial infarction) (HCC)  09/13/23 DES RCA  Patient's Home Medications on Admission:  Current Outpatient Medications:    aspirin 81 MG chewable tablet, Chew 1 tablet (81 mg total) by mouth daily., Disp: 90 tablet, Rfl: 2   atomoxetine (STRATTERA) 80 MG capsule, Take 80 mg by mouth daily. (Patient not taking: Reported on 10/22/2023), Disp: , Rfl:    CAPLYTA 42 MG capsule, Take 42 mg by mouth at bedtime., Disp: , Rfl:    clopidogrel (PLAVIX) 75 MG tablet, Take 1 tablet (75 mg total) by mouth daily with breakfast., Disp: 90 tablet, Rfl: 2   lamoTRIgine (LAMICTAL) 150 MG tablet, Take 150 mg by mouth at bedtime., Disp: , Rfl:    Misc Natural Products (AIRBORNE ELDERBERRY) 100-50 MG CHEW, Chew 2 tablets by mouth daily., Disp: , Rfl:    mupirocin ointment (BACTROBAN) 2 %, Apply 1 Application topically 2 (two) times daily., Disp: 22 g, Rfl: 0   nitroGLYCERIN (NITROSTAT) 0.4 MG SL tablet, Place 1 tablet (0.4 mg total) under the tongue every 5 (five) minutes x 3 doses as needed for chest pain., Disp: 25 tablet, Rfl: 1   Omega Fatty Acids-Vitamins (OMEGA-3 GUMMIES) CHEW, Chew 2 tablets by mouth daily. VitaFusion, Disp: , Rfl:    rosuvastatin (CRESTOR) 40 MG tablet, TAKE ONE TABLET BY MOUTH EVERY DAY, Disp: 90 tablet, Rfl: 3  Past Medical History: Past Medical History:  Diagnosis Date   ADHD    Bipolar 1 disorder (HCC)     Tobacco  Use: Social History   Tobacco Use  Smoking Status Never  Smokeless Tobacco Never    Labs: Review Flowsheet       Latest Ref Rng & Units 09/13/2023 09/14/2023  Labs for ITP Cardiac and Pulmonary Rehab  Cholestrol 0 - 200 mg/dL - 409   LDL (calc) 0 - 99 mg/dL - 79   HDL-C >81 mg/dL - 40   Trlycerides <191 mg/dL - 478   Hemoglobin G9F 4.8 - 5.6 % 5.1  -    Details            Capillary Blood Glucose: No results found for: "GLUCAP"   Exercise Target Goals: Exercise Program Goal: Individual exercise prescription set using results from initial 6 min walk test and THRR while considering  patient's activity barriers and safety.   Exercise Prescription Goal: Initial exercise prescription builds to 30-45 minutes a day of aerobic activity, 2-3 days per week.  Home exercise guidelines will be given to patient during program as part of exercise prescription that the participant will acknowledge.  Activity Barriers & Risk Stratification:  Activity Barriers & Cardiac Risk Stratification - 10/22/23 1341       Activity Barriers & Cardiac Risk Stratification   Activity Barriers Shortness of Breath    Cardiac Risk Stratification High   <5 METs on            6 Minute Walk:  6 Minute Walk  Row Name 10/22/23 1340         6 Minute Walk   Phase Initial     Distance 1625 feet     Walk Time 6 minutes     # of Rest Breaks 0     MPH 3.08     METS 4.36     RPE 11     Perceived Dyspnea  1.5     VO2 Peak 15.26     Symptoms Yes (comment)     Comments SOB- resolved with rest. no pain     Resting HR 66 bpm     Resting BP 112/78     Resting Oxygen Saturation  97 %     Exercise Oxygen Saturation  during 6 min walk 100 %     Max Ex. HR 93 bpm     Max Ex. BP 122/82     2 Minute Post BP 114/76              Oxygen Initial Assessment:   Oxygen Re-Evaluation:   Oxygen Discharge (Final Oxygen Re-Evaluation):   Initial Exercise Prescription:  Initial Exercise  Prescription - 10/22/23 1300       Date of Initial Exercise RX and Referring Provider   Date 10/22/23    Referring Provider Kristeen Miss, MD    Expected Discharge Date 01/13/24      Elliptical   Level 1    Speed 1    Minutes 15    METs 3.5      Rower   Level 1    Watts 45    Minutes 15    METs 3.5      Prescription Details   Frequency (times per week) 3    Duration Progress to 30 minutes of continuous aerobic without signs/symptoms of physical distress      Intensity   THRR 40-80% of Max Heartrate 70-139    Ratings of Perceived Exertion 11-13    Perceived Dyspnea 0-4      Progression   Progression Continue progressive overload as per policy without signs/symptoms or physical distress.      Resistance Training   Training Prescription Yes    Weight 5    Reps 10-15             Perform Capillary Blood Glucose checks as needed.  Exercise Prescription Changes:   Exercise Prescription Changes     Row Name 10/26/23 1400 11/06/23 1500           Response to Exercise   Blood Pressure (Admit) 124/82 112/72      Blood Pressure (Exercise) 128/82 120/80      Blood Pressure (Exit) 114/72 114/78      Heart Rate (Admit) 79 bpm 90 bpm      Heart Rate (Exercise) 144 bpm 113 bpm      Heart Rate (Exit) 89 bpm 75 bpm      Rating of Perceived Exertion (Exercise) 10 13      Symptoms None None      Comments Pt's first day in the CRP2 program Reviewed METs      Duration Continue with 30 min of aerobic exercise without signs/symptoms of physical distress. Continue with 30 min of aerobic exercise without signs/symptoms of physical distress.      Intensity THRR unchanged THRR unchanged        Progression   Progression Continue to progress workloads to maintain intensity without signs/symptoms of physical distress. Continue to progress workloads to maintain  intensity without signs/symptoms of physical distress.      Average METs 4.5 4.75        Resistance Training   Training  Prescription Yes Yes      Weight 5 5      Reps 10-15 10-15      Time 10 Minutes 10 Minutes        Interval Training   Interval Training No No        Elliptical   Level 1 1      Speed 1 1      Minutes 15 15      METs 4.6 4.5        Rower   Level 1 6      Watts 29 56      Minutes 15 15      METs 4.46 4.96               Exercise Comments:   Exercise Comments     Row Name 10/26/23 1426 11/06/23 1555         Exercise Comments Pt's first day in the CRP2 program. Pt exercised without complaints. Reviewed METs with patient today. Pt is making good progress.               Exercise Goals and Review:   Exercise Goals     Row Name 10/22/23 1029             Exercise Goals   Increase Physical Activity Yes       Intervention Provide advice, education, support and counseling about physical activity/exercise needs.;Develop an individualized exercise prescription for aerobic and resistive training based on initial evaluation findings, risk stratification, comorbidities and participant's personal goals.       Expected Outcomes Short Term: Attend rehab on a regular basis to increase amount of physical activity.;Long Term: Exercising regularly at least 3-5 days a week.;Long Term: Add in home exercise to make exercise part of routine and to increase amount of physical activity.       Increase Strength and Stamina Yes       Intervention Provide advice, education, support and counseling about physical activity/exercise needs.;Develop an individualized exercise prescription for aerobic and resistive training based on initial evaluation findings, risk stratification, comorbidities and participant's personal goals.       Expected Outcomes Short Term: Increase workloads from initial exercise prescription for resistance, speed, and METs.;Long Term: Improve cardiorespiratory fitness, muscular endurance and strength as measured by increased METs and functional capacity ( );Short Term:  Perform resistance training exercises routinely during rehab and add in resistance training at home       Able to understand and use rate of perceived exertion (RPE) scale Yes       Intervention Provide education and explanation on how to use RPE scale       Expected Outcomes Short Term: Able to use RPE daily in rehab to express subjective intensity level;Long Term:  Able to use RPE to guide intensity level when exercising independently       Knowledge and understanding of Target Heart Rate Range (THRR) Yes       Intervention Provide education and explanation of THRR including how the numbers were predicted and where they are located for reference       Expected Outcomes Short Term: Able to state/look up THRR;Short Term: Able to use daily as guideline for intensity in rehab;Long Term: Able to use THRR to govern intensity when exercising independently  Understanding of Exercise Prescription Yes       Intervention Provide education, explanation, and written materials on patient's individual exercise prescription       Expected Outcomes Short Term: Able to explain program exercise prescription;Long Term: Able to explain home exercise prescription to exercise independently                Exercise Goals Re-Evaluation :  Exercise Goals Re-Evaluation     Row Name 10/26/23 1424             Exercise Goal Re-Evaluation   Exercise Goals Review Increase Physical Activity;Understanding of Exercise Prescription;Increase Strength and Stamina;Knowledge and understanding of Target Heart Rate Range (THRR);Able to understand and use rate of perceived exertion (RPE) scale       Comments Pt's first day in the CRP2 program. Pt understands the exercise Rx, RPE scale and THRR.       Expected Outcomes Will continue to monitor the patient and progress exericse workloads as tolerated.                Discharge Exercise Prescription (Final Exercise Prescription Changes):  Exercise Prescription Changes  - 11/06/23 1500       Response to Exercise   Blood Pressure (Admit) 112/72    Blood Pressure (Exercise) 120/80    Blood Pressure (Exit) 114/78    Heart Rate (Admit) 90 bpm    Heart Rate (Exercise) 113 bpm    Heart Rate (Exit) 75 bpm    Rating of Perceived Exertion (Exercise) 13    Symptoms None    Comments Reviewed METs    Duration Continue with 30 min of aerobic exercise without signs/symptoms of physical distress.    Intensity THRR unchanged      Progression   Progression Continue to progress workloads to maintain intensity without signs/symptoms of physical distress.    Average METs 4.75      Resistance Training   Training Prescription Yes    Weight 5    Reps 10-15    Time 10 Minutes      Interval Training   Interval Training No      Elliptical   Level 1    Speed 1    Minutes 15    METs 4.5      Rower   Level 6    Watts 56    Minutes 15    METs 4.96             Nutrition:  Target Goals: Understanding of nutrition guidelines, daily intake of sodium 1500mg , cholesterol 200mg , calories 30% from fat and 7% or less from saturated fats, daily to have 5 or more servings of fruits and vegetables.  Biometrics:  Pre Biometrics - 10/22/23 1115       Pre Biometrics   Waist Circumference 44.5 inches    Hip Circumference 43.5 inches    Waist to Hip Ratio 1.02 %    Triceps Skinfold 15 mm    % Body Fat 30.4 %    Grip Strength 56 kg    Flexibility 0 in   could not reach   Single Leg Stand 11.32 seconds              Nutrition Therapy Plan and Nutrition Goals:  Nutrition Therapy & Goals - 10/26/23 1336       Nutrition Therapy   Diet Heart Healthy Diet    Drug/Food Interactions Statins/Certain Fruits      Personal Nutrition Goals   Nutrition Goal Patient  to identify strategies for reducing cardiovascular risk by attending the Pritikin education and nutrition series weekly.    Personal Goal #2 Patient to improve diet quality by using the plate method  as a guide for meal planning to include lean protein/plant protein, fruits, vegetables, whole grains, nonfat dairy as part of a well-balanced diet.    Comments Casimiro Needle has medical history of NASH, CAD, NSTEMI. His LDL remains above goal of <57 (79, treated with crestor). Patient will benefit from participation in intensive cardiac rehab for nutrition, exercise, and lifestyle modification.      Intervention Plan   Intervention Prescribe, educate and counsel regarding individualized specific dietary modifications aiming towards targeted core components such as weight, hypertension, lipid management, diabetes, heart failure and other comorbidities.;Nutrition handout(s) given to patient.    Expected Outcomes Short Term Goal: Understand basic principles of dietary content, such as calories, fat, sodium, cholesterol and nutrients.;Long Term Goal: Adherence to prescribed nutrition plan.             Nutrition Assessments:  MEDIFICTS Score Key: >=70 Need to make dietary changes  40-70 Heart Healthy Diet <= 40 Therapeutic Level Cholesterol Diet    Picture Your Plate Scores: <32 Unhealthy dietary pattern with much room for improvement. 41-50 Dietary pattern unlikely to meet recommendations for good health and room for improvement. 51-60 More healthful dietary pattern, with some room for improvement.  >60 Healthy dietary pattern, although there may be some specific behaviors that could be improved.    Nutrition Goals Re-Evaluation:  Nutrition Goals Re-Evaluation     Row Name 10/26/23 1336             Goals   Current Weight 243 lb 2.7 oz (110.3 kg)       Comment LDL 79, HDL 40, lipoprotein A WNL, A1c WNL       Expected Outcome Casimiro Needle has medical history of NASH, CAD, NSTEMI. His LDL remains above goal of <20 (79, treated with crestor). Patient will benefit from participation in intensive cardiac rehab for nutrition, exercise, and lifestyle modification.                Nutrition  Goals Re-Evaluation:  Nutrition Goals Re-Evaluation     Row Name 10/26/23 1336             Goals   Current Weight 243 lb 2.7 oz (110.3 kg)       Comment LDL 79, HDL 40, lipoprotein A WNL, A1c WNL       Expected Outcome Casimiro Needle has medical history of NASH, CAD, NSTEMI. His LDL remains above goal of <25 (79, treated with crestor). Patient will benefit from participation in intensive cardiac rehab for nutrition, exercise, and lifestyle modification.                Nutrition Goals Discharge (Final Nutrition Goals Re-Evaluation):  Nutrition Goals Re-Evaluation - 10/26/23 1336       Goals   Current Weight 243 lb 2.7 oz (110.3 kg)    Comment LDL 79, HDL 40, lipoprotein A WNL, A1c WNL    Expected Outcome Casimiro Needle has medical history of NASH, CAD, NSTEMI. His LDL remains above goal of <42 (79, treated with crestor). Patient will benefit from participation in intensive cardiac rehab for nutrition, exercise, and lifestyle modification.             Psychosocial: Target Goals: Acknowledge presence or absence of significant depression and/or stress, maximize coping skills, provide positive support system. Participant is able to verbalize types  and ability to use techniques and skills needed for reducing stress and depression.  Initial Review & Psychosocial Screening:  Initial Psych Review & Screening - 10/22/23 1117       Initial Review   Current issues with Current Depression;Current Stress Concerns    Source of Stress Concerns Chronic Illness;Unable to participate in former interests or hobbies    Comments Casimiro Needle shared that he has had some feelings of depression/stress since his MI due to being unable to participate in former activites/hobbies. He says the changes since his MI have been tough to manage, especially some of the medication adjustments like coming off Strattera. Casimiro Needle is already establishde with a psychologist and psychiatrist, and denies any need for further  resources at this time.      Family Dynamics   Good Support System? Yes   Casimiro Needle has his wife for support     Barriers   Psychosocial barriers to participate in program The patient should benefit from training in stress management and relaxation.      Screening Interventions   Interventions Encouraged to exercise;Provide feedback about the scores to participant;To provide support and resources with identified psychosocial needs    Expected Outcomes Short Term goal: Utilizing psychosocial counselor, staff and physician to assist with identification of specific Stressors or current issues interfering with healing process. Setting desired goal for each stressor or current issue identified.;Long Term Goal: Stressors or current issues are controlled or eliminated.;Short Term goal: Identification and review with participant of any Quality of Life or Depression concerns found by scoring the questionnaire.;Long Term goal: The participant improves quality of Life and PHQ9 Scores as seen by post scores and/or verbalization of changes             Quality of Life Scores:  Quality of Life - 10/22/23 1353       Quality of Life   Select Quality of Life      Quality of Life Scores   Health/Function Pre 12.2 %    Socioeconomic Pre 18 %    Psych/Spiritual Pre 13.93 %    Family Pre 19.2 %    GLOBAL Pre 14.78 %            Scores of 19 and below usually indicate a poorer quality of life in these areas.  A difference of  2-3 points is a clinically meaningful difference.  A difference of 2-3 points in the total score of the Quality of Life Index has been associated with significant improvement in overall quality of life, self-image, physical symptoms, and general health in studies assessing change in quality of life.  PHQ-9: Review Flowsheet       10/22/2023  Depression screen PHQ 2/9  Decreased Interest 1  Down, Depressed, Hopeless 1  PHQ - 2 Score 2  Altered sleeping 1  Tired, decreased  energy 1  Change in appetite 1  Feeling bad or failure about yourself  1  Trouble concentrating 1  Moving slowly or fidgety/restless 1  Suicidal thoughts 0  PHQ-9 Score 8  Difficult doing work/chores Somewhat difficult    Details           Interpretation of Total Score  Total Score Depression Severity:  1-4 = Minimal depression, 5-9 = Mild depression, 10-14 = Moderate depression, 15-19 = Moderately severe depression, 20-27 = Severe depression   Psychosocial Evaluation and Intervention:   Psychosocial Re-Evaluation:  Psychosocial Re-Evaluation     Row Name 10/26/23 1411 11/11/23 1658  Psychosocial Re-Evaluation   Current issues with Current Depression;Current Stress Concerns Current Depression;Current Stress Concerns      Comments No concerns or stressors voiced on first day of exercise. Quality of life and PHq2-9 reviewed on 11/09/23. Pt admits to feeling depressed/down post his cardiac event. Pt stated he has a hx of bipolar takes mood stabilizers and sees a therapist regularly. RN encouraged to reach out to staff if he needs additional resources or a referral.      Expected Outcomes Casimiro Needle will hyave controlled or decreased stress/depression upon completion of cardiac rehab Casimiro Needle will hyave controlled or decreased stress/depression upon completion of cardiac rehab      Interventions Stress management education;Relaxation education;Encouraged to attend Cardiac Rehabilitation for the exercise Stress management education;Relaxation education;Encouraged to attend Cardiac Rehabilitation for the exercise      Continue Psychosocial Services  Follow up required by staff Follow up required by staff        Initial Review   Source of Stress Concerns Chronic Illness Chronic Illness      Comments Will continue to montior and offer support as needed. Will continue to montior and offer support as needed.               Psychosocial Discharge (Final Psychosocial  Re-Evaluation):  Psychosocial Re-Evaluation - 11/11/23 1658       Psychosocial Re-Evaluation   Current issues with Current Depression;Current Stress Concerns    Comments Quality of life and PHq2-9 reviewed on 11/09/23. Pt admits to feeling depressed/down post his cardiac event. Pt stated he has a hx of bipolar takes mood stabilizers and sees a therapist regularly. RN encouraged to reach out to staff if he needs additional resources or a referral.    Expected Outcomes Casimiro Needle will hyave controlled or decreased stress/depression upon completion of cardiac rehab    Interventions Stress management education;Relaxation education;Encouraged to attend Cardiac Rehabilitation for the exercise    Continue Psychosocial Services  Follow up required by staff      Initial Review   Source of Stress Concerns Chronic Illness    Comments Will continue to montior and offer support as needed.             Vocational Rehabilitation: Provide vocational rehab assistance to qualifying candidates.   Vocational Rehab Evaluation & Intervention:  Vocational Rehab - 10/22/23 1120       Initial Vocational Rehab Evaluation & Intervention   Assessment shows need for Vocational Rehabilitation No   Casimiro Needle is working            Education: Education Goals: Education classes will be provided on a weekly basis, covering required topics. Participant will state understanding/return demonstration of topics presented.    Education     Row Name 10/26/23 1300     Education   Cardiac Education Topics Pritikin   Nurse, children's Exercise Physiologist   Select Psychosocial   Psychosocial Healthy Minds, Bodies, Hearts   Instruction Review Code 1- Verbalizes Understanding   Class Start Time 1357   Class Stop Time 1429   Class Time Calculation (min) 32 min    Row Name 10/28/23 1400     Education   Cardiac Education Topics Pritikin   Nurse, children's    Educator Nurse   Select Nutrition   Nutrition Becoming a Pritikin Chef   Instruction Review Code 1- Verbalizes Understanding   Class Start Time 1356  Row Name 11/02/23 1400     Education   Cardiac Education Topics Pritikin   Select Core Videos     Core Videos   Educator Exercise Physiologist   Select Nutrition   Nutrition Other   Instruction Review Code 1- Verbalizes Understanding   Class Start Time 1356   Class Stop Time 1430   Class Time Calculation (min) 34 min    Row Name 11/06/23 1400     Education   Cardiac Education Topics Pritikin   Glass blower/designer Nutrition   Nutrition Workshop Label Reading   Instruction Review Code 1- Verbalizes Understanding   Class Start Time 1401   Class Stop Time 1433   Class Time Calculation (min) 32 min    Row Name 11/09/23 1600     Education   Cardiac Education Topics Pritikin   [employer];Workshops     Garment/textile technologist Psychosocial   Psychosocial Workshop Recognizing and Reducing Stress   Instruction Review Code 1- Verbalizes Understanding   Class Start Time 1359   Class Stop Time 1445   Class Time Calculation (min) 46 min    Row Name 11/11/23 1500     Education   Cardiac Education Topics Pritikin   Customer service manager   Weekly Topic Efficiency Cooking - Meals in a Snap   Instruction Review Code 1- Verbalizes Understanding   Class Start Time 1358   Class Stop Time 1438   Class Time Calculation (min) 40 min            Core Videos: Exercise    Move It!  Clinical staff conducted group or individual video education with verbal and written material and guidebook.  Patient learns the recommended Pritikin exercise program. Exercise with the goal of living a long, healthy life. Some of the health benefits of exercise include controlled diabetes, healthier blood pressure levels,  improved cholesterol levels, improved heart and lung capacity, improved sleep, and better body composition. Everyone should speak with their doctor before starting or changing an exercise routine.  Biomechanical Limitations Clinical staff conducted group or individual video education with verbal and written material and guidebook.  Patient learns how biomechanical limitations can impact exercise and how we can mitigate and possibly overcome limitations to have an impactful and balanced exercise routine.  Body Composition Clinical staff conducted group or individual video education with verbal and written material and guidebook.  Patient learns that body composition (ratio of muscle mass to fat mass) is a key component to assessing overall fitness, rather than body weight alone. Increased fat mass, especially visceral belly fat, can put [place] at increased risk for metabolic syndrome, type 2 diabetes, heart disease, and even death. It is recommended to combine diet and exercise (cardiovascular and resistance training) to improve your body composition. Seek guidance from your physician and exercise physiologist before implementing an exercise routine.  Exercise Action Plan Clinical staff conducted group or individual video education with verbal and written material and guidebook.  Patient learns the recommended strategies to achieve and enjoy long-term exercise adherence, including variety, self-motivation, self-efficacy, and positive decision making. Benefits of exercise include fitness, good health, weight management, more energy, better sleep, less stress, and overall well-being.  Medical   Heart Disease Risk Reduction Clinical staff conducted group or individual video education with verbal and written material and guidebook.  Patient learns our heart is our most vital organ as it circulates oxygen, nutrients, white blood cells, and hormones throughout the entire body, and carries waste away. Data  supports a plant-based eating plan like the Pritikin Program for its effectiveness in slowing progression of and reversing heart disease. The video provides a number of recommendations to address heart disease.   Metabolic Syndrome and Belly Fat  Clinical staff conducted group or individual video education with verbal and written material and guidebook.  Patient learns what metabolic syndrome is, how it leads to heart disease, and how one can reverse it and keep it from coming back. You have metabolic syndrome if you have 3 of the following 5 criteria: abdominal obesity, high blood pressure, high triglycerides, low HDL cholesterol, and high blood sugar.  Hypertension and Heart Disease Clinical staff conducted group or individual video education with verbal and written material and guidebook.  Patient learns that high blood pressure, or hypertension, is very common in the [place]. Hypertension is largely due to excessive salt intake, but other important risk factors include being overweight, physical inactivity, drinking too much alcohol, smoking, and not eating enough potassium from fruits and vegetables. High blood pressure is a leading risk factor for heart attack, stroke, congestive heart failure, dementia, kidney failure, and premature death. Long-term effects of excessive salt intake include stiffening of the arteries and thickening of heart muscle and organ damage. Recommendations include ways to reduce hypertension and the risk of heart disease.  Diseases of Our Time - Focusing on Diabetes Clinical staff conducted group or individual video education with verbal and written material and guidebook.  Patient learns why the best way to stop diseases of our time is prevention, through food and other lifestyle changes. Medicine (such as prescription pills and surgeries) is often only a Band-Aid on the problem, not a long-term solution. Most common diseases of our time include obesity, type 2  diabetes, hypertension, heart disease, and cancer. The Pritikin Program is recommended and has been proven to help reduce, reverse, and/or prevent the damaging effects of metabolic syndrome.  Nutrition   Overview of the Pritikin Eating Plan  Clinical staff conducted group or individual video education with verbal and written material and guidebook.  Patient learns about the Pritikin Eating Plan for disease risk reduction. The Pritikin Eating Plan emphasizes a wide variety of unrefined, minimally-processed carbohydrates, like fruits, vegetables, whole grains, and legumes. Go, Caution, and Stop food choices are explained. Plant-based and lean animal proteins are emphasized. Rationale provided for low sodium intake for blood pressure control, low added sugars for blood sugar stabilization, and low added fats and oils for coronary artery disease risk reduction and weight management.  Calorie Density  Clinical staff conducted group or individual video education with verbal and written material and guidebook.  Patient learns about calorie density and how it impacts the Pritikin Eating Plan. Knowing the characteristics of the food you choose will help you decide whether those foods will lead to weight gain or weight loss, and whether you want to consume more or less of them. Weight loss is usually a side effect of the Pritikin Eating Plan because of its focus on low calorie-dense foods.  Label Reading  Clinical staff conducted group or individual video education with verbal and written material and guidebook.  Patient learns about the Pritikin recommended label reading guidelines and corresponding recommendations regarding calorie density, added sugars, sodium content, and whole grains.  Dining Out - Part 1  Clinical staff conducted  group or individual video education with verbal and written material and guidebook.  Patient learns that restaurant meals can be sabotaging because they can be so high in  calories, fat, sodium, and/or sugar. Patient learns recommended strategies on how to positively address this and avoid unhealthy pitfalls.  Facts on Fats  Clinical staff conducted group or individual video education with verbal and written material and guidebook.  Patient learns that lifestyle modifications can be just as effective, if not more so, as many medications for lowering your risk of heart disease. A Pritikin lifestyle can help to reduce your risk of inflammation and atherosclerosis (cholesterol build-up, or plaque, in the artery walls). Lifestyle interventions such as dietary choices and physical activity address the cause of atherosclerosis. A review of the types of fats and their impact on blood cholesterol levels, along with dietary recommendations to reduce fat intake is also included.  Nutrition Action Plan  Clinical staff conducted group or individual video education with verbal and written material and guidebook.  Patient learns how to incorporate Pritikin recommendations into their lifestyle. Recommendations include planning and keeping personal health goals in mind as an important part of their success.  Healthy Mind-Set    Healthy Minds, Bodies, Hearts  Clinical staff conducted group or individual video education with verbal and written material and guidebook.  Patient learns how to identify when they are stressed. Video will discuss the impact of that stress, as well as the many benefits of stress management. Patient will also be introduced to stress management techniques. The way we think, act, and feel has an impact on our hearts.  How Our Thoughts Can Heal Our Hearts  Clinical staff conducted group or individual video education with verbal and written material and guidebook.  Patient learns that negative thoughts can cause depression and anxiety. This can result in negative lifestyle behavior and serious health problems. Cognitive behavioral therapy is an effective method to  help control our thoughts in order to change and improve our emotional outlook.  Additional Videos:  Exercise    Improving Performance  Clinical staff conducted group or individual video education with verbal and written material and guidebook.  Patient learns to use a non-linear approach by alternating intensity levels and lengths of time spent exercising to help burn more calories and lose more body fat. Cardiovascular exercise helps improve heart health, metabolism, hormonal balance, blood sugar control, and recovery from fatigue. Resistance training improves strength, endurance, balance, coordination, reaction time, metabolism, and muscle mass. Flexibility exercise improves circulation, posture, and balance. Seek guidance from your physician and exercise physiologist before implementing an exercise routine and learn your capabilities and proper form for all exercise.  Introduction to Yoga  Clinical staff conducted group or individual video education with verbal and written material and guidebook.  Patient learns about yoga, a discipline of the coming together of mind, breath, and body. The benefits of yoga include improved flexibility, improved range of motion, better posture and core strength, increased lung function, weight loss, and positive self-image. Yoga's heart health benefits include lowered blood pressure, healthier heart rate, decreased cholesterol and triglyceride levels, improved immune function, and reduced stress. Seek guidance from your physician and exercise physiologist before implementing an exercise routine and learn your capabilities and proper form for all exercise.  Medical   Aging: Enhancing Your Quality of Life  Clinical staff conducted group or individual video education with verbal and written material and guidebook.  Patient learns key strategies and recommendations to stay in good physical  health and enhance quality of life, such as prevention strategies, having an  advocate, securing a Health Care Proxy and Power of Attorney, and keeping a list of medications and system for tracking them. It also discusses how to avoid risk for bone loss.  Biology of Weight Control  Clinical staff conducted group or individual video education with verbal and written material and guidebook.  Patient learns that weight gain occurs because we consume more calories than we burn (eating more, moving less). Even if your body weight is normal, you may have higher ratios of fat compared to muscle mass. Too much body fat puts you at increased risk for cardiovascular disease, heart attack, stroke, type 2 diabetes, and obesity-related cancers. In addition to exercise, following the Pritikin Eating Plan can help reduce your risk.  Decoding Lab Results  Clinical staff conducted group or individual video education with verbal and written material and guidebook.  Patient learns that lab test reflects one measurement whose values change over time and are influenced by many factors, including medication, stress, sleep, exercise, food, hydration, pre-existing medical conditions, and more. It is recommended to use the knowledge from this video to become more involved with your lab results and evaluate your numbers to speak with your doctor.   Diseases of Our Time - Overview  Clinical staff conducted group or individual video education with verbal and written material and guidebook.  Patient learns that according to the CDC, 50% to 70% of chronic diseases (such as obesity, type 2 diabetes, elevated lipids, hypertension, and heart disease) are avoidable through lifestyle improvements including healthier food choices, listening to satiety cues, and increased physical activity.  Sleep Disorders Clinical staff conducted group or individual video education with verbal and written material and guidebook.  Patient learns how good quality and duration of sleep are important to overall health and  well-being. Patient also learns about sleep disorders and how they impact health along with recommendations to address them, including discussing with a physician.  Nutrition  Dining Out - Part 2 Clinical staff conducted group or individual video education with verbal and written material and guidebook.  Patient learns how to plan ahead and communicate in order to maximize their dining experience in a healthy and nutritious manner. Included are recommended food choices based on the type of restaurant the patient is visiting.   Fueling a Banker conducted group or individual video education with verbal and written material and guidebook.  There is a strong connection between our food choices and our health. Diseases like obesity and type 2 diabetes are very prevalent and are in large-part due to lifestyle choices. The Pritikin Eating Plan provides plenty of food and hunger-curbing satisfaction. It is easy to follow, affordable, and helps reduce health risks.  Menu Workshop  Clinical staff conducted group or individual video education with verbal and written material and guidebook.  Patient learns that restaurant meals can sabotage health goals because they are often packed with calories, fat, sodium, and sugar. Recommendations include strategies to plan ahead and to communicate with the manager, chef, or server to help order a healthier meal.  Planning Your Eating Strategy  Clinical staff conducted group or individual video education with verbal and written material and guidebook.  Patient learns about the Pritikin Eating Plan and its benefit of reducing the risk of disease. The Pritikin Eating Plan does not focus on calories. Instead, it emphasizes high-quality, nutrient-rich foods. By knowing the characteristics of the foods, we choose, we  can determine their calorie density and make informed decisions.  Targeting Your Nutrition Priorities  Clinical staff conducted group or  individual video education with verbal and written material and guidebook.  Patient learns that lifestyle habits have a tremendous impact on disease risk and progression. This video provides eating and physical activity recommendations based on your personal health goals, such as reducing LDL cholesterol, losing weight, preventing or controlling type 2 diabetes, and reducing high blood pressure.  Vitamins and Minerals  Clinical staff conducted group or individual video education with verbal and written material and guidebook.  Patient learns different ways to obtain key vitamins and minerals, including through a recommended healthy diet. It is important to discuss all supplements you take with your doctor.   Healthy Mind-Set    Smoking Cessation  Clinical staff conducted group or individual video education with verbal and written material and guidebook.  Patient learns that cigarette smoking and tobacco addiction pose a serious health risk which affects millions of people. Stopping smoking will significantly reduce the risk of heart disease, lung disease, and many forms of cancer. Recommended strategies for quitting are covered, including working with your doctor to develop a successful plan.  Culinary   Becoming a Set designer conducted group or individual video education with verbal and written material and guidebook.  Patient learns that cooking at home can be healthy, cost-effective, quick, and puts them in control. Keys to cooking healthy recipes will include looking at your recipe, assessing your equipment needs, planning ahead, making it simple, choosing cost-effective seasonal ingredients, and limiting the use of added fats, salts, and sugars.  Cooking - Breakfast and Snacks  Clinical staff conducted group or individual video education with verbal and written material and guidebook.  Patient learns how important breakfast is to satiety and nutrition through the entire  day. Recommendations include key foods to eat during breakfast to help stabilize blood sugar levels and to prevent overeating at meals later in the day. Planning ahead is also a key component.  Cooking - Educational psychologist conducted group or individual video education with verbal and written material and guidebook.  Patient learns eating strategies to improve overall health, including an approach to cook more at home. Recommendations include thinking of animal protein as a side on your plate rather than center stage and focusing instead on lower calorie dense options like vegetables, fruits, whole grains, and plant-based proteins, such as beans. Making sauces in large quantities to freeze for later and leaving the skin on your vegetables are also recommended to maximize your experience.  Cooking - Healthy Salads and Dressing Clinical staff conducted group or individual video education with verbal and written material and guidebook.  Patient learns that vegetables, fruits, whole grains, and legumes are the foundations of the Pritikin Eating Plan. Recommendations include how to incorporate each of these in flavorful and healthy salads, and how to create homemade salad dressings. Proper handling of ingredients is also covered. Cooking - Soups and [employer] - Soups and Desserts Clinical staff conducted group or individual video education with verbal and written material and guidebook.  Patient learns that Pritikin soups and desserts make for easy, nutritious, and delicious snacks and meal components that are low in sodium, fat, sugar, and calorie density, while high in vitamins, minerals, and filling fiber. Recommendations include simple and healthy ideas for soups and desserts.   Overview     The Pritikin Solution Program Overview Clinical staff conducted group  or individual video education with verbal and written material and guidebook.  Patient learns that the results of the  Pritikin Program have been documented in more than 100 articles published in peer-reviewed journals, and the benefits include reducing risk factors for (and, in some cases, even reversing) high cholesterol, high blood pressure, type 2 diabetes, obesity, and more! An overview of the three key pillars of the Pritikin Program will be covered: eating well, doing regular exercise, and having a healthy mind-set.  WORKSHOPS  Exercise: Exercise Basics: Building Your Action Plan Clinical staff led group instruction and group discussion with PowerPoint presentation and patient guidebook. To enhance the learning environment the use of posters, models and videos may be added. At the conclusion of this workshop, patients will comprehend the difference between physical activity and exercise, as well as the benefits of incorporating both, into their routine. Patients will understand the FITT (Frequency, Intensity, Time, and Type) principle and how to use it to build an exercise action plan. In addition, safety concerns and other considerations for exercise and cardiac rehab will be addressed by the presenter. The purpose of this lesson is to promote a comprehensive and effective weekly exercise routine in order to improve patients' overall level of fitness.   Managing Heart Disease: Your Path to a Healthier Heart Clinical staff led group instruction and group discussion with PowerPoint presentation and patient guidebook. To enhance the learning environment the use of posters, models and videos may be added.At the conclusion of this workshop, patients will understand the anatomy and physiology of the heart. Additionally, they will understand how Pritikin's three pillars impact the risk factors, the progression, and the management of heart disease.  The purpose of this lesson is to provide a high-level overview of the heart, heart disease, and how the Pritikin lifestyle positively impacts risk factors.  Exercise  Biomechanics Clinical staff led group instruction and group discussion with PowerPoint presentation and patient guidebook. To enhance the learning environment the use of posters, models and videos may be added. Patients will learn how the structural parts of their bodies function and how these functions impact their daily activities, movement, and exercise. Patients will learn how to promote a neutral spine, learn how to manage pain, and identify ways to improve their physical movement in order to promote healthy living. The purpose of this lesson is to expose patients to common physical limitations that impact physical activity. Participants will learn practical ways to adapt and manage aches and pains, and to minimize their effect on regular exercise. Patients will learn how to maintain good posture while sitting, walking, and lifting.  Balance Training and Fall Prevention  Clinical staff led group instruction and group discussion with PowerPoint presentation and patient guidebook. To enhance the learning environment the use of posters, models and videos may be added. At the conclusion of this workshop, patients will understand the importance of their sensorimotor skills (vision, proprioception, and the vestibular system) in maintaining their ability to balance as they age. Patients will apply a variety of balancing exercises that are appropriate for their current level of function. Patients will understand the common causes for poor balance, possible solutions to these problems, and ways to modify their physical environment in order to minimize their fall risk. The purpose of this lesson is to teach patients about the importance of maintaining balance as they age and ways to minimize their risk of falling.  WORKSHOPS   Nutrition:  Fueling a Ship broker led group instruction and  group discussion with PowerPoint presentation and patient guidebook. To enhance the learning  environment the use of posters, models and videos may be added. Patients will review the foundational principles of the Pritikin Eating Plan and understand what constitutes a serving size in each of the food groups. Patients will also learn Pritikin-friendly foods that are better choices when away from home and review make-ahead meal and snack options. Calorie density will be reviewed and applied to three nutrition priorities: weight maintenance, weight loss, and weight gain. The purpose of this lesson is to reinforce (in a group setting) the key concepts around what patients are recommended to eat and how to apply these guidelines when away from home by planning and selecting Pritikin-friendly options. Patients will understand how calorie density may be adjusted for different weight management goals.  Mindful Eating  Clinical staff led group instruction and group discussion with PowerPoint presentation and patient guidebook. To enhance the learning environment the use of posters, models and videos may be added. Patients will briefly review the concepts of the Pritikin Eating Plan and the importance of low-calorie dense foods. The concept of mindful eating will be introduced as well as the importance of paying attention to internal hunger signals. Triggers for non-hunger eating and techniques for dealing with triggers will be explored. The purpose of this lesson is to provide patients with the opportunity to review the basic principles of the Pritikin Eating Plan, discuss the value of eating mindfully and how to measure internal cues of hunger and fullness using the Hunger Scale. Patients will also discuss reasons for non-hunger eating and learn strategies to use for controlling emotional eating.  Targeting Your Nutrition Priorities Clinical staff led group instruction and group discussion with PowerPoint presentation and patient guidebook. To enhance the learning environment the use of posters, models and  videos may be added. Patients will learn how to determine their genetic susceptibility to disease by reviewing their family history. Patients will gain insight into the importance of diet as part of an overall healthy lifestyle in mitigating the impact of genetics and other environmental insults. The purpose of this lesson is to provide patients with the opportunity to assess their personal nutrition priorities by looking at their family history, their own health history and current risk factors. Patients will also be able to discuss ways of prioritizing and modifying the Pritikin Eating Plan for their highest risk areas  Menu  Clinical staff led group instruction and group discussion with PowerPoint presentation and patient guidebook. To enhance the learning environment the use of posters, models and videos may be added. Using menus brought in from [employer], or printed from [employer], patients will apply the Pritikin dining out guidelines that were presented in the [employer] video. Patients will also be able to practice these guidelines in a variety of provided scenarios. The purpose of this lesson is to provide patients with the opportunity to practice hands-on learning of the Pritikin Dining Out guidelines with actual menus and practice scenarios.  Label Reading Clinical staff led group instruction and group discussion with PowerPoint presentation and patient guidebook. To enhance the learning environment the use of posters, models and videos may be added. Patients will review and discuss the Pritikin label reading guidelines presented in Pritikin's Label Reading Educational series video. Using fool labels brought in from local grocery stores and markets, patients will apply the label reading guidelines and determine if the packaged food meet the Pritikin guidelines. The purpose of this lesson  is to provide patients with the opportunity to review, discuss, and practice  hands-on learning of the Pritikin Label Reading guidelines with actual packaged food labels. Cooking School  Pritikin's [employer] are designed to teach patients ways to prepare quick, simple, and affordable recipes at home. The importance of nutrition's role in chronic disease risk reduction is reflected in its emphasis in the overall Pritikin program. By learning how to prepare essential core [employer] Eating Plan recipes, patients will increase control over what they eat; be able to customize the flavor of foods without the use of added salt, sugar, or fat; and improve the quality of the food they consume. By learning a set of core recipes which are easily assembled, quickly prepared, and affordable, patients are more likely to prepare more healthy foods at home. These workshops focus on convenient breakfasts, simple entres, side dishes, and desserts which can be prepared with minimal effort and are consistent with nutrition recommendations for cardiovascular risk reduction. Cooking [employer] are taught by a Armed forces logistics/support/administrative officer (RD) who has been trained by the [employer]. The chef or RD has a clear understanding of the importance of minimizing - if not completely eliminating - added fat, sugar, and sodium in recipes. Throughout the series of Cooking School Workshop sessions, patients will learn about healthy ingredients and efficient methods of cooking to build confidence in their capability to prepare    Cooking School weekly topics:  Adding Flavor- Sodium-Free  Fast and Healthy Breakfasts  Powerhouse Plant-Based Proteins  Satisfying Salads and Dressings  Simple Sides and Sauces  International Cuisine-Spotlight on the [employer] Zones  Delicious Desserts  Savory Soups  [employer] - Meals in a Astronomer Appetizers and Snacks  Comforting Weekend Breakfasts  One-Pot Wonders   Fast Evening Meals  Landscape architect Your Pritikin  Plate  WORKSHOPS   Healthy Mindset (Psychosocial):  Focused Goals, Sustainable Changes Clinical staff led group instruction and group discussion with PowerPoint presentation and patient guidebook. To enhance the learning environment the use of posters, models and videos may be added. Patients will be able to apply effective goal setting strategies to establish at least one personal goal, and then take consistent, meaningful action toward that goal. They will learn to identify common barriers to achieving personal goals and develop strategies to overcome them. Patients will also gain an understanding of how our mind-set can impact our ability to achieve goals and the importance of cultivating a positive and growth-oriented mind-set. The purpose of this lesson is to provide patients with a deeper understanding of how to set and achieve personal goals, as well as the tools and strategies needed to overcome common obstacles which may arise along the way.  From Head to Heart: The Power of a Healthy Outlook  Clinical staff led group instruction and group discussion with PowerPoint presentation and patient guidebook. To enhance the learning environment the use of posters, models and videos may be added. Patients will be able to recognize and describe the impact of emotions and mood on physical health. They will discover the importance of self-care and explore self-care practices which may work for them. Patients will also learn how to utilize the 4 C's to cultivate a healthier outlook and better manage stress and challenges. The purpose of this lesson is to demonstrate to patients how a healthy outlook is an essential part of maintaining good health, especially as they continue their cardiac rehab journey.  Healthy Sleep for a  Healthy Heart Clinical staff led group instruction and group discussion with PowerPoint presentation and patient guidebook. To enhance the learning environment the use of posters,  models and videos may be added. At the conclusion of this workshop, patients will be able to demonstrate knowledge of the importance of sleep to overall health, well-being, and quality of life. They will understand the symptoms of, and treatments for, common sleep disorders. Patients will also be able to identify daytime and nighttime behaviors which impact sleep, and they will be able to apply these tools to help manage sleep-related challenges. The purpose of this lesson is to provide patients with a general overview of sleep and outline the importance of quality sleep. Patients will learn about a few of the most common sleep disorders. Patients will also be introduced to the concept of "sleep hygiene," and discover ways to self-manage certain sleeping problems through simple daily behavior changes. Finally, the workshop will motivate patients by clarifying the links between quality sleep and their goals of heart-healthy living.   Recognizing and Reducing Stress Clinical staff led group instruction and group discussion with PowerPoint presentation and patient guidebook. To enhance the learning environment the use of posters, models and videos may be added. At the conclusion of this workshop, patients will be able to understand the types of stress reactions, differentiate between acute and chronic stress, and recognize the impact that chronic stress has on their health. They will also be able to apply different coping mechanisms, such as reframing negative self-talk. Patients will have the opportunity to practice a variety of stress management techniques, such as deep abdominal breathing, progressive muscle relaxation, and/or guided imagery.  The purpose of this lesson is to educate patients on the role of stress in their lives and to provide healthy techniques for coping with it.  Learning Barriers/Preferences:  Learning Barriers/Preferences - 10/22/23 1120       Learning Barriers/Preferences   Learning  Barriers None    Learning Preferences Computer/Internet;Group Instruction;Individual Instruction;Skilled Demonstration;Verbal Instruction;Video             Education Topics:  Knowledge Questionnaire Score:  Knowledge Questionnaire Score - 10/22/23 1123       Knowledge Questionnaire Score   Pre Score 21/24             Core Components/Risk Factors/Patient Goals at Admission:  Personal Goals and Risk Factors at Admission - 10/22/23 1120       Core Components/Risk Factors/Patient Goals on Admission    Weight Management Yes;Obesity;Weight Loss    Intervention Weight Management: Develop a combined nutrition and exercise program designed to reach desired caloric intake, while maintaining appropriate intake of nutrient and fiber, sodium and fats, and appropriate energy expenditure required for the weight goal.;Weight Management: Provide education and appropriate resources to help participant work on and attain dietary goals.;Weight Management/Obesity: Establish reasonable short term and long term weight goals.;Obesity: Provide education and appropriate resources to help participant work on and attain dietary goals.    Expected Outcomes Understanding of distribution of calorie intake throughout the day with the consumption of 4-5 meals/snacks;Understanding recommendations for meals to include 15-35% energy as protein, 25-35% energy from fat, 35-60% energy from carbohydrates, less than 200mg  of dietary cholesterol, 20-35 gm of total fiber daily;Weight Loss: Understanding of general recommendations for a balanced deficit meal plan, which promotes 1-2 lb weight loss per week and includes a negative energy balance of [phone] kcal/d;Long Term: Adherence to nutrition and physical activity/exercise program aimed toward attainment of established weight goal;Short  Term: Continue to assess and modify interventions until short term weight is achieved    Lipids Yes    Intervention Provide education and  support for participant on nutrition & aerobic/resistive exercise along with prescribed medications to achieve LDL 70mg , HDL >40mg .    Expected Outcomes Short Term: Participant states understanding of desired cholesterol values and is compliant with medications prescribed. Participant is following exercise prescription and nutrition guidelines.;Long Term: Cholesterol controlled with medications as prescribed, with individualized exercise RX and with personalized nutrition plan. Value goals: LDL < 70mg , HDL > 40 mg.    Stress Yes    Intervention Offer individual and/or small group education and counseling on adjustment to heart disease, stress management and health-related lifestyle change. Teach and support self-help strategies.;Refer participants experiencing significant psychosocial distress to appropriate mental health specialists for further evaluation and treatment. When possible, include family members and significant others in education/counseling sessions.    Expected Outcomes Short Term: Participant demonstrates changes in health-related behavior, relaxation and other stress management skills, ability to obtain effective social support, and compliance with psychotropic medications if prescribed.;Long Term: Emotional wellbeing is indicated by absence of clinically significant psychosocial distress or social isolation.             Core Components/Risk Factors/Patient Goals Review:   Goals and Risk Factor Review     Row Name 10/26/23 1413 11/11/23 1700           Core Components/Risk Factors/Patient Goals Review   Personal Goals Review Weight Management/Obesity;Lipids;Stress Weight Management/Obesity;Lipids;Stress      Review Casimiro Needle started cardiac rehab on 10/26/23. Casimiro Needle did well with exercise. Vital signs were stable. Casimiro Needle has been doing  well with exercise. Vital signs have been  stable.      Expected Outcomes Casimiro Needle will conitnue to participate in cardiac rehab for exercise,  nutrition and lifestyle modifications. Casimiro Needle will conitnue to participate in cardiac rehab for exercise, nutrition and lifestyle modifications.               Core Components/Risk Factors/Patient Goals at Discharge (Final Review):   Goals and Risk Factor Review - 11/11/23 1700       Core Components/Risk Factors/Patient Goals Review   Personal Goals Review Weight Management/Obesity;Lipids;Stress    Review Casimiro Needle has been doing  well with exercise. Vital signs have been  stable.    Expected Outcomes Casimiro Needle will conitnue to participate in cardiac rehab for exercise, nutrition and lifestyle modifications.             ITP Comments:  ITP Comments     Row Name 10/22/23 1029 10/26/23 1409 11/11/23 1657       ITP Comments Dr. Armanda Magic medical director. Introduction to pritikin education/intensive cardiac rehab. Initial orientation packet reviewed with patient. 30 Day ITP Review. Casimiro Needle started cardiac rehab on 10/26/23. Casimiro Needle did well with exercise. 30 Day ITP Review. Casimiro Needle has good attendance and participation in cardiac rehab.              Comments: See ITP comments.Thayer Headings RN BSN

## 2023-11-13 ENCOUNTER — Ambulatory Visit (HOSPITAL_COMMUNITY): Payer: Medicaid Other

## 2023-11-13 ENCOUNTER — Encounter (HOSPITAL_COMMUNITY)
Admission: RE | Admit: 2023-11-13 | Discharge: 2023-11-13 | Disposition: A | Discharge status: Home / Self Care | Payer: Medicaid Other | Source: Ambulatory Visit | Attending: Cardiovascular Disease | Admitting: Cardiovascular Disease

## 2023-11-13 DIAGNOSIS — Z955 Presence of coronary angioplasty implant and graft: Secondary | ICD-10-CM | POA: Diagnosis not present

## 2023-11-13 DIAGNOSIS — I214 Non-ST elevation (NSTEMI) myocardial infarction: Secondary | ICD-10-CM

## 2023-11-16 ENCOUNTER — Ambulatory Visit (HOSPITAL_COMMUNITY): Payer: Medicaid Other

## 2023-11-16 ENCOUNTER — Encounter (HOSPITAL_COMMUNITY)
Admission: RE | Admit: 2023-11-16 | Discharge: 2023-11-16 | Disposition: A | Discharge status: Home / Self Care | Payer: Medicaid Other | Source: Ambulatory Visit | Attending: Cardiovascular Disease | Admitting: Cardiovascular Disease

## 2023-11-16 DIAGNOSIS — I214 Non-ST elevation (NSTEMI) myocardial infarction: Secondary | ICD-10-CM

## 2023-11-16 DIAGNOSIS — Z955 Presence of coronary angioplasty implant and graft: Secondary | ICD-10-CM

## 2023-11-17 ENCOUNTER — Other Ambulatory Visit: Payer: Self-pay

## 2023-11-17 ENCOUNTER — Telehealth: Payer: Self-pay | Admitting: Cardiovascular Disease

## 2023-11-17 ENCOUNTER — Encounter (HOSPITAL_COMMUNITY): Payer: Self-pay

## 2023-11-17 ENCOUNTER — Emergency Department (HOSPITAL_COMMUNITY)
Admission: EM | Admit: 2023-11-17 | Discharge: 2023-11-17 | Disposition: A | Discharge status: Home / Self Care | Payer: Medicaid Other | Attending: Emergency Medicine | Admitting: Emergency Medicine

## 2023-11-17 ENCOUNTER — Emergency Department (HOSPITAL_COMMUNITY): Payer: Medicaid Other

## 2023-11-17 DIAGNOSIS — R079 Chest pain, unspecified: Secondary | ICD-10-CM | POA: Insufficient documentation

## 2023-11-17 HISTORY — DX: Acute myocardial infarction, unspecified: I21.9

## 2023-11-17 LAB — CBC
HCT: 47.6 % (ref 39.0–52.0)
Hemoglobin: 16.3 g/dL (ref 13.0–17.0)
MCH: 31.1 pg (ref 26.0–34.0)
MCHC: 34.2 g/dL (ref 30.0–36.0)
MCV: 90.8 fL (ref 80.0–100.0)
Platelets: 183 10*3/uL (ref 150–400)
RBC: 5.24 MIL/uL (ref 4.22–5.81)
RDW: 12.6 % (ref 11.5–15.5)
WBC: 4.1 10*3/uL (ref 4.0–10.5)
nRBC: 0 % (ref 0.0–0.2)

## 2023-11-17 LAB — COMPREHENSIVE METABOLIC PANEL
ALT: 43 U/L (ref 0–44)
AST: 44 U/L — ABNORMAL HIGH (ref 15–41)
Albumin: 4.2 g/dL (ref 3.5–5.0)
Alkaline Phosphatase: 49 U/L (ref 38–126)
Anion gap: 7 (ref 5–15)
BUN: 9 mg/dL (ref 6–20)
CO2: 27 mmol/L (ref 22–32)
Calcium: 8.8 mg/dL — ABNORMAL LOW (ref 8.9–10.3)
Chloride: 104 mmol/L (ref 98–111)
Creatinine, Ser: 1.02 mg/dL (ref 0.61–1.24)
GFR, Estimated: >60 mL/min (ref >60)
Glucose, Bld: 122 mg/dL — ABNORMAL HIGH (ref 70–99)
Potassium: 3.8 mmol/L (ref 3.5–5.1)
Sodium: 138 mmol/L (ref 135–145)
Total Bilirubin: 0.7 mg/dL (ref <1.2)
Total Protein: 6.7 g/dL (ref 6.5–8.1)

## 2023-11-17 LAB — TROPONIN I (HIGH SENSITIVITY)
Troponin I (High Sensitivity): 5 ng/L (ref <18)
Troponin I (High Sensitivity): 4 ng/L (ref <18)

## 2023-11-17 NOTE — Telephone Encounter (Signed)
   Pt c/o of Chest Pain: STAT if active CP, including tightness, pressure, jaw pain, radiating pain to shoulder/upper arm/back, CP unrelieved by Nitro. Symptoms reported of SOB, nausea, vomiting, sweating.  1. Are you having CP right now? Yes                                     2. Are you experiencing any other symptoms (ex. SOB, nausea, vomiting, sweating)? Nausea   3. Is your CP continuous or coming and going? continuous   4. Have you taken Nitroglycerin? Yes at 10:00 am   5. How long have you been experiencing CP? This morning at around 9:30 am    6. If NO CP at time of call then end call with telling Pt to call back or call 911 if Chest pain returns prior to return call from triage team.

## 2023-11-17 NOTE — ED Provider Notes (Signed)
EMERGENCY DEPARTMENT AT [facility] Provider Note   CSN: [id number] Arrival date & time: 11/17/23  1316     History  Chief Complaint  Patient presents with   Chest Pain    Billy Andersen is a 47 y.o. male.  Patient to ED with chest pain that started this morning in the left chest. He reports it comes and goes, lasting minutes. No modifying factors. No chest pain currently. He is experiencing a tingling sensation in the left arm that has consistently lasted all day. He took a single NTG at home without change in symptoms. No vomiting. Recent NSTEMI 09/2023.   The history is provided by the patient and the spouse. No language interpreter was used.  Chest Pain      Home Medications Prior to Admission medications   Medication Sig Start Date End Date Taking? Authorizing Provider  aspirin 81 MG chewable tablet Chew 1 tablet (81 mg total) by mouth daily. 09/22/23   Arty Baumgartner, NP  atomoxetine (STRATTERA) 80 MG capsule Take 80 mg by mouth daily. Patient not taking: Reported on 10/22/2023 09/01/23   [provider]  CAPLYTA 42 MG capsule Take 42 mg by mouth at bedtime. 09/01/23   [provider]  clopidogrel (PLAVIX) 75 MG tablet Take 1 tablet (75 mg total) by mouth daily with breakfast. 09/23/23   Laverda Page B, NP  lamoTRIgine (LAMICTAL) 150 MG tablet Take 150 mg by mouth at bedtime. 09/01/23   [provider]  Misc Natural Products (AIRBORNE ELDERBERRY) 100-50 MG CHEW Chew 2 tablets by mouth daily.    [provider]  mupirocin ointment (BACTROBAN) 2 % Apply 1 Application topically 2 (two) times daily. 10/07/23   Waldon Merl, [name]  nitroGLYCERIN (NITROSTAT) 0.4 MG SL tablet Place 1 tablet (0.4 mg total) under the tongue every 5 (five) minutes x 3 doses as needed for chest pain. 09/14/23   Yates Decamp, MD  Omega Fatty Acids-Vitamins (OMEGA-3 GUMMIES) CHEW Chew 2 tablets by mouth daily. [employer]    [provider]  rosuvastatin (CRESTOR) 40 MG tablet TAKE ONE TABLET BY MOUTH EVERY DAY 11/03/23   Tereso Newcomer T, [name]      Allergies    Patient has no known allergies.    Review of Systems   Review of Systems  Cardiovascular:  Positive for chest pain.    Physical Exam Updated Vital Signs BP 108/62   Pulse 82   Temp 98 F (36.7 C)   Resp 18   Ht 6\' 2"  (1.88 m)   Wt 108.9 kg   SpO2 97%   BMI 30.81 kg/m  Physical Exam Vitals and nursing note reviewed.  Constitutional:      Appearance: He is well-developed.  HENT:     Head: Normocephalic.  Cardiovascular:     Rate and Rhythm: Normal rate and regular rhythm.     Heart sounds: No murmur heard. Pulmonary:     Effort: Pulmonary effort is normal.     Breath sounds: Normal breath sounds. No wheezing, rhonchi or rales.  Abdominal:     General: Bowel sounds are normal.     Palpations: Abdomen is soft.     Tenderness: There is no abdominal tenderness. There is no guarding or rebound.  Musculoskeletal:        General: Normal range of motion.     Cervical back: Normal range of motion and neck supple.     Right lower leg: No edema.  Left lower leg: No edema.  Skin:    General: Skin is warm and dry.  Neurological:     General: No focal deficit present.     Mental Status: He is alert and oriented to person, place, and time.     ED Results / Procedures / Treatments   Labs (all labs ordered are listed, but only abnormal results are displayed) Labs Reviewed  COMPREHENSIVE METABOLIC PANEL - Abnormal; Notable for the following components:      Result Value   Glucose, Bld 122 (*)    Calcium 8.8 (*)    AST 44 (*)    All other components within normal limits  CBC  TROPONIN I (HIGH SENSITIVITY)  TROPONIN I (HIGH SENSITIVITY)   Results for orders placed or performed during the hospital encounter of 11/17/23  CBC   Collection Time: 11/17/23  1:22 PM  Result Value Ref Range   WBC 4.1 4.0 - 10.5 K/uL   RBC 5.24 4.22 -  5.81 MIL/uL   Hemoglobin 16.3 13.0 - 17.0 g/dL   HCT 40.9 81.1 - 91.4 %   MCV 90.8 80.0 - 100.0 fL   MCH 31.1 26.0 - 34.0 pg   MCHC 34.2 30.0 - 36.0 g/dL   RDW 78.2 95.6 - 21.3 %   Platelets 183 150 - 400 K/uL   nRBC 0.0 0.0 - 0.2 %  Comprehensive metabolic panel   Collection Time: 11/17/23  1:22 PM  Result Value Ref Range   Sodium 138 135 - 145 mmol/L   Potassium 3.8 3.5 - 5.1 mmol/L   Chloride 104 98 - 111 mmol/L   CO2 27 22 - 32 mmol/L   Glucose, Bld 122 (H) 70 - 99 mg/dL   BUN 9 6 - 20 mg/dL   Creatinine, Ser 0.86 0.61 - 1.24 mg/dL   Calcium 8.8 (L) 8.9 - 10.3 mg/dL   Total Protein 6.7 6.5 - 8.1 g/dL   Albumin 4.2 3.5 - 5.0 g/dL   AST 44 (H) 15 - 41 U/L   ALT 43 0 - 44 U/L   Alkaline Phosphatase 49 38 - 126 U/L   Total Bilirubin 0.7 <1.2 mg/dL   GFR, Estimated >57 >84 mL/min   Anion gap 7 5 - 15  Troponin I (High Sensitivity)   Collection Time: 11/17/23  1:22 PM  Result Value Ref Range   Troponin I (High Sensitivity) 5 <18 ng/L  Troponin I (High Sensitivity)   Collection Time: 11/17/23  5:15 PM  Result Value Ref Range   Troponin I (High Sensitivity) 4 <18 ng/L    EKG EKG Interpretation Date/Time:  Tuesday November 17 2023 13:02:56 EST Ventricular Rate:  75 PR Interval:  122 QRS Duration:  96 QT Interval:  414 QTC Calculation: 462 R Axis:   56  Text Interpretation: Normal sinus rhythm Normal ECG Confirmed by Gerhard Munch [phone]) on 11/17/2023 7:11:43 PM  Radiology DG Chest 2 View Result Date: 11/17/2023 CLINICAL DATA:  Chest pain EXAM: CHEST - 2 VIEW COMPARISON:  09/21/2023 FINDINGS: The heart size and mediastinal contours are within normal limits. Both lungs are clear. Azygous fissure noted. The visualized skeletal structures are unremarkable. IMPRESSION: No active cardiopulmonary disease. Electronically Signed   By: Duanne Guess D.O.   On: 11/17/2023 15:33    Procedures Procedures    Medications Ordered in ED Medications - No data to  display  ED Course/ Medical Decision Making/ A&P  Medical Decision Making This patient presents to the ED for concern of chest pain, this involves an extensive number of treatment options, and is a complaint that carries with it a high risk of complications and morbidity.  The differential diagnosis includes ACS, PE, PTX, PNA, MSK pain, viral URI   Co morbidities that complicate the patient evaluation  Recent NSTEMI, HLD   Additional history obtained:  Additional history and/or information obtained from chart review, notable for EMR notes from hospitalization 10/24 - RCA occlusion   Lab Tests:  I Ordered, and personally interpreted labs.  The pertinent results include:  Troponin negative x 2; no electrolyte abnormalities; no leukocytosis.     Imaging Studies ordered:  I ordered imaging studies including CXR I independently visualized and interpreted imaging which showed negative I agree with the radiologist interpretation   Cardiac Monitoring:  The patient was maintained on a cardiac monitor.  I personally viewed and interpreted the cardiac monitored which showed an underlying rhythm of: NSR, no ischemic changes  Consultations Obtained:  I requested consultation with the Dr. Wyline Mood, cardiology,  and discussed lab and imaging findings as well as pertinent plan - they recommend: stable for discharge home - outpatient cardiac follow up   Problem List / ED Course:  Chest pain earlier in the day, atypical, resolved No SOB Left arm tingling causing concern for the patient    Social Determinants of Health:  Anxious about current health   Disposition:  After consideration of the diagnostic results and the patients response to treatment, I feel that the patient would benefit from appropriate for discharge home, outpatient cardiology follow up.   Amount and/or Complexity of Data Reviewed Labs: ordered. Radiology:  ordered.           Final Clinical Impression(s) / ED Diagnoses Final diagnoses:  Nonspecific chest pain    Rx / DC Orders ED Discharge Orders     None         Danne Harbor 11/17/23 2319    Gerhard Munch, MD 11/17/23 2329

## 2023-11-17 NOTE — ED Provider Triage Note (Signed)
Emergency Medicine Provider Triage Evaluation Note  Billy Andersen , a 47 y.o. male  was evaluated in triage.  Pt complains of chest pain that began when he got up to go to the bathroom and walked across his home.  Patient had an MI/NSTEMI in October number with 1 stent placed.  Review of Systems  Positive:  Negative:   Physical Exam  BP 123/77 (BP Location: Right Arm)   Pulse 70   Temp 98 F (36.7 C) (Oral)   Resp 15   Ht 6\' 2"  (1.88 m)   Wt 108.9 kg   SpO2 100%   BMI 30.81 kg/m  Gen:   Awake, no distress   Resp:  Normal effort  MSK:   Moves extremities without difficulty  Other:    Medical Decision Making  Medically screening exam initiated at 1:28 PM.  Appropriate orders placed.  Billy Andersen was informed that the remainder of the evaluation will be completed by another provider, this initial triage assessment does not replace that evaluation, and the importance of remaining in the ED until their evaluation is complete.  Initial EKG nonischemic.  Blood work ordered.  Billy Andersen signs reassuring.  Patient with known cardiovascular disease.  Recommended patient be placed in a room.   Billy Andersen T, DO 11/17/23 1337

## 2023-11-17 NOTE — ED Triage Notes (Signed)
Left sided chest pain radiating to left jaw and left arm that started today with tingling to left arm. Hx of NSTEMI and cardiac stent. EMS gave 324mg  Aspirin and 0.4 SL nitroglycerin.   EMS VS: BP 122/68 HR 76 100% on RA CBG 176 RR 16

## 2023-11-17 NOTE — Discharge Instructions (Signed)
As we dicussed your tests today are all reassuring and chest xray is clear. No concerning changes were seen to your EKG heart tracing.   You can be discharged home and should follow up with your cardiologist in the outpatient setting tomorrow by calling the office to schedule a time to be seen.   Return to the ED with any new or concerning symptoms.

## 2023-11-17 NOTE — Telephone Encounter (Signed)
Pt advised to go to ED for evaluation. Although CP has improved since nitroglycerin he still has tingling in his left arm.  With recent PCI and CP this morning, pt advised to go to ED.  Patient agreeable to plan.

## 2023-11-18 ENCOUNTER — Ambulatory Visit (HOSPITAL_COMMUNITY): Payer: Medicaid Other

## 2023-11-18 ENCOUNTER — Encounter (HOSPITAL_COMMUNITY): Payer: Medicaid Other

## 2023-11-20 ENCOUNTER — Ambulatory Visit (HOSPITAL_COMMUNITY): Payer: Medicaid Other

## 2023-11-20 ENCOUNTER — Encounter (HOSPITAL_COMMUNITY): Payer: Medicaid Other

## 2023-11-23 ENCOUNTER — Encounter (HOSPITAL_COMMUNITY): Payer: Medicaid Other

## 2023-11-23 ENCOUNTER — Ambulatory Visit (HOSPITAL_COMMUNITY): Payer: Medicaid Other

## 2023-11-26 ENCOUNTER — Telehealth (HOSPITAL_COMMUNITY): Payer: Self-pay | Admitting: *Deleted

## 2023-11-26 NOTE — Telephone Encounter (Signed)
Reviewed pt recent ER visit for chest pain on 12/17.  Pt presented to ER with CP and left arm tingling.  Seen by by ER provider, consulted with cardiology- Dr. Wyline Mood.  Provider recommended stable for discharge home outpatient cardiac follow up.  No mention of [facility].  Called and spoke to pt.  Advised we will hold on exercise until seen in follow up on 1/7 with Dr. Elease Hashimoto.  Canceled appts and added missed appt to the end.  New grad date 2/28.  Will notify the CR staff.  Pt verbalized understanding. State he has not had any additional chest pain or arm tingling. Alanson Aly, BSN Cardiac and Emergency planning/management officer

## 2023-11-27 ENCOUNTER — Ambulatory Visit (HOSPITAL_COMMUNITY): Payer: Medicaid Other

## 2023-11-27 ENCOUNTER — Encounter (HOSPITAL_COMMUNITY): Payer: Medicaid Other

## 2023-11-30 ENCOUNTER — Ambulatory Visit (HOSPITAL_COMMUNITY): Payer: Medicaid Other

## 2023-11-30 ENCOUNTER — Encounter (HOSPITAL_COMMUNITY): Payer: Medicaid Other

## 2023-12-04 ENCOUNTER — Ambulatory Visit (HOSPITAL_COMMUNITY): Payer: Medicaid Other

## 2023-12-04 ENCOUNTER — Encounter (HOSPITAL_COMMUNITY): Payer: Medicaid Other

## 2023-12-07 ENCOUNTER — Ambulatory Visit (HOSPITAL_COMMUNITY): Payer: Medicaid Other

## 2023-12-07 ENCOUNTER — Encounter: Payer: Self-pay | Admitting: Cardiovascular Disease

## 2023-12-07 ENCOUNTER — Encounter (HOSPITAL_COMMUNITY): Payer: Medicaid Other

## 2023-12-07 NOTE — Progress Notes (Addendum)
 Cardiology Office Note:  .   Date:  12/08/2023  ID:  [name], DOB 23-Nov-1976, MRN [id number] PCP: Freddrick Johns  [facility] Providers Cardiologist:  Aleene Passe, MD    History of Present Illness: .   Billy Andersen is a 48 y.o. male who I met in Oct. 2024 when he presented to the hospital with CP and NSTEMI. Cath showed an occluded RCA which was successfully stented   Went back into the hospital on Dec. 17 Felt weird in his chest ,  sharp pan ,  Tingling in left arm ,  Tried NTG  ,  tingling did not resolve. Called the office,  Went to hospital   He has been having some neck pain  Encouraged him to have this looked into   Has had some palpitations  Weird heart flutter    Glucose levels are minimally elevated Has been eating LOTS of corn ( 1-2 steamer bags a day )  Encouraged him to back off the corn  Wants to go back on Stretter[employer]  - was taken off at the hospital   He Is ok to restart cardiac rehab.    I suspect he is deconditioned    ROS:   Studies Reviewed: .         Risk Assessment/Calculations:             Physical Exam:   VS:  BP 110/70   Pulse 68   Ht 6' 2 (1.88 m)   Wt 250 lb 9.6 oz (113.7 kg)   SpO2 98%   BMI 32.18 kg/m    Wt Readings from Last 3 Encounters:  12/08/23 250 lb 9.6 oz (113.7 kg)  11/17/23 240 lb (108.9 kg)  10/22/23 243 lb 2.7 oz (110.3 kg)    GEN: Well nourished, well developed in no acute distress NECK: No JVD; No carotid bruits CARDIAC: RRR, soft systolic murmur radiating to left axillary line   RESPIRATORY:  Clear to auscultation without rales, wheezing or rhonchi  ABDOMEN: Soft, non-tender, non-distended EXTREMITIES:  No edema; No deformity   ASSESSMENT AND PLAN: .       CAD : Status post stenting of his right coronary artery.  He seems to be doing well.  He did have some unusual chest twinges and presented back to the emergency room in mid December.  His workup at that time was negative.  2.   Left arm tingling.  He he is having some left arm tingling that does not sound cardiac.  He also has been having some neck pain.  Will refer him to neurology for further evaluation of his neck pain and left arm tingling.  3.  Hyperglycemia: He has had several episodes of hypoglycemia.  He eats lots of steamed corn.  In fact eats 1-2 bags of steamed corn daily.  Of asked him to reduce his intake of corn.  Have also asked him to eat monitor his foods that are white, wheat, sweet.  He needs to exercise.  I have cleared him to go back to cardiac rehab.  4.  Hyperlipidemia: Is on rosuvastatin  40.  Will recheck lipids today.  5.  ADHD:   When he was discharged from the hospital somebody told him to stop his Strattera .  In my opinion I think stopping Strattera  would be more harmful than continue it.  He is very stable.  If he develops any tachycardia or hypertension we will be able to control that with metoprolol  and other  medications.  I do not see any contraindications to him restarting history care and will have a restart history of care at his previous dose or as otherwise directed by his primary medical doctor.       Dispo:  6 months with Dr. Pietro    Signed, Aleene Passe, MD

## 2023-12-08 ENCOUNTER — Encounter (HOSPITAL_COMMUNITY): Payer: Self-pay | Admitting: *Deleted

## 2023-12-08 ENCOUNTER — Encounter: Payer: Self-pay | Admitting: Cardiovascular Disease

## 2023-12-08 ENCOUNTER — Telehealth (HOSPITAL_COMMUNITY): Payer: Self-pay | Admitting: *Deleted

## 2023-12-08 ENCOUNTER — Ambulatory Visit (INDEPENDENT_AMBULATORY_CARE_PROVIDER_SITE_OTHER): Payer: Medicaid Other

## 2023-12-08 ENCOUNTER — Ambulatory Visit: Payer: Medicaid Other | Attending: Cardiovascular Disease | Admitting: Cardiovascular Disease

## 2023-12-08 ENCOUNTER — Telehealth: Payer: Self-pay | Admitting: Cardiovascular Disease

## 2023-12-08 VITALS — BP 110/70 | HR 68 | Ht 74.0 in | Wt 250.6 lb

## 2023-12-08 DIAGNOSIS — M542 Cervicalgia: Secondary | ICD-10-CM | POA: Diagnosis not present

## 2023-12-08 DIAGNOSIS — R200 Anesthesia of skin: Secondary | ICD-10-CM

## 2023-12-08 DIAGNOSIS — E7800 Pure hypercholesterolemia, unspecified: Secondary | ICD-10-CM

## 2023-12-08 DIAGNOSIS — R002 Palpitations: Secondary | ICD-10-CM

## 2023-12-08 DIAGNOSIS — Z955 Presence of coronary angioplasty implant and graft: Secondary | ICD-10-CM

## 2023-12-08 DIAGNOSIS — I214 Non-ST elevation (NSTEMI) myocardial infarction: Secondary | ICD-10-CM

## 2023-12-08 DIAGNOSIS — R202 Paresthesia of skin: Secondary | ICD-10-CM

## 2023-12-08 LAB — COMPREHENSIVE METABOLIC PANEL
ALT: 35 [IU]/L (ref 0–44)
AST: 35 [IU]/L (ref 0–40)
Albumin: 4.6 g/dL (ref 4.1–5.1)
Alkaline Phosphatase: 59 [IU]/L (ref 44–121)
BUN/Creatinine Ratio: 11 (ref 9–20)
BUN: 11 mg/dL (ref 6–24)
Bilirubin Total: 0.4 mg/dL (ref 0.0–1.2)
CO2: 24 mmol/L (ref 20–29)
Calcium: 9.4 mg/dL (ref 8.7–10.2)
Chloride: 104 mmol/L (ref 96–106)
Creatinine, Ser: 1.03 mg/dL (ref 0.76–1.27)
Globulin, Total: 2 g/dL (ref 1.5–4.5)
Glucose: 65 mg/dL — ABNORMAL LOW (ref 70–99)
Potassium: 3.9 mmol/L (ref 3.5–5.2)
Sodium: 142 mmol/L (ref 134–144)
Total Protein: 6.6 g/dL (ref 6.0–8.5)
eGFR: 91 mL/min/{1.73_m2} (ref >59)

## 2023-12-08 LAB — LIPID PANEL
Chol/HDL Ratio: 2.7 {ratio} (ref 0.0–5.0)
Cholesterol, Total: 110 mg/dL (ref 100–199)
HDL: 41 mg/dL (ref >39)
LDL Chol Calc (NIH): 42 mg/dL (ref 0–99)
Triglycerides: 164 mg/dL — ABNORMAL HIGH (ref 0–149)
VLDL Cholesterol Cal: 27 mg/dL (ref 5–40)

## 2023-12-08 LAB — HEMOGLOBIN A1C
Est. average glucose Bld gHb Est-mCnc: 108 mg/dL
Hgb A1c MFr Bld: 5.4 % (ref 4.8–5.6)

## 2023-12-08 NOTE — Progress Notes (Signed)
 Cardiac Individual Treatment Plan  Patient Details  Name: Billy Andersen MRN: [id number] Date of Birth: [birth year] Referring Provider:   Flowsheet Row CARDIAC REHAB PHASE II ORIENTATION from 10/22/2023 in [facility] for Heart, Vascular, & Lung Health  Referring Provider Aleene Passe, MD       Initial Encounter Date:  Flowsheet Row CARDIAC REHAB PHASE II ORIENTATION from 10/22/2023 in [facility] for Heart, Vascular, & Lung Health  Date 10/22/23       Visit Diagnosis: 09/13/23 NSTEMI (non-ST elevated myocardial infarction) (HCC)  09/13/23 DES RCA  Patient's Home Medications on Admission:  Current Outpatient Medications:    aspirin  81 MG chewable tablet, Chew 1 tablet (81 mg total) by mouth daily., Disp: 90 tablet, Rfl: 2   atomoxetine  (STRATTERA ) 80 MG capsule, Take 80 mg by mouth daily. (Patient not taking: Reported on 10/22/2023), Disp: , Rfl:    CAPLYTA  42 MG capsule, Take 42 mg by mouth at bedtime., Disp: , Rfl:    clopidogrel  (PLAVIX ) 75 MG tablet, Take 1 tablet (75 mg total) by mouth daily with breakfast., Disp: 90 tablet, Rfl: 2   lamoTRIgine  (LAMICTAL ) 200 MG tablet, Take 200 mg by mouth daily., Disp: , Rfl:    Misc Natural Products (AIRBORNE ELDERBERRY) 100-50 MG CHEW, Chew 2 tablets by mouth daily., Disp: , Rfl:    nitroGLYCERIN  (NITROSTAT ) 0.4 MG SL tablet, Place 1 tablet (0.4 mg total) under the tongue every 5 (five) minutes x 3 doses as needed for chest pain., Disp: 25 tablet, Rfl: 1   Omega Fatty Acids-Vitamins (OMEGA-3 GUMMIES) CHEW, Chew 2 tablets by mouth daily. VitaFusion, Disp: , Rfl:    rosuvastatin  (CRESTOR ) 40 MG tablet, TAKE ONE TABLET BY MOUTH EVERY DAY, Disp: 90 tablet, Rfl: 3  Past Medical History: Past Medical History:  Diagnosis Date   ADHD    Bipolar 1 disorder (HCC)    MI (myocardial infarction) (HCC)     Tobacco Use: Social History   Tobacco Use  Smoking Status Never  Smokeless  Tobacco Never    Labs: Review Flowsheet       Latest Ref Rng & Units 09/13/2023 09/14/2023  Labs for ITP Cardiac and Pulmonary Rehab  Cholestrol 0 - 200 mg/dL - 852   LDL (calc) 0 - 99 mg/dL - 79   HDL-C >59 mg/dL - 40   Trlycerides <849 mg/dL - 857   Hemoglobin J8r 4.8 - 5.6 % 5.1  -    Capillary Blood Glucose: No results found for: GLUCAP   Exercise Target Goals: Exercise Program Goal: Individual exercise prescription set using results from initial 6 min walk test and THRR while considering  patient's activity barriers and safety.   Exercise Prescription Goal: Initial exercise prescription builds to 30-45 minutes a day of aerobic activity, 2-3 days per week.  Home exercise guidelines will be given to patient during program as part of exercise prescription that the participant will acknowledge.  Activity Barriers & Risk Stratification:  Activity Barriers & Cardiac Risk Stratification - 10/22/23 1341       Activity Barriers & Cardiac Risk Stratification   Activity Barriers Shortness of Breath    Cardiac Risk Stratification High   <5 METs on            6 Minute Walk:  6 Minute Walk     Row Name 10/22/23 1340         6 Minute Walk   Phase Initial  Distance 1625 feet     Walk Time 6 minutes     # of Rest Breaks 0     MPH 3.08     METS 4.36     RPE 11     Perceived Dyspnea  1.5     VO2 Peak 15.26     Symptoms Yes (comment)     Comments SOB- resolved with rest. no pain     Resting HR 66 bpm     Resting BP 112/78     Resting Oxygen Saturation  97 %     Exercise Oxygen Saturation  during 6 min walk 100 %     Max Ex. HR 93 bpm     Max Ex. BP 122/82     2 Minute Post BP 114/76              Oxygen Initial Assessment:   Oxygen Re-Evaluation:   Oxygen Discharge (Final Oxygen Re-Evaluation):   Initial Exercise Prescription:  Initial Exercise Prescription - 10/22/23 1300       Date of Initial Exercise RX and Referring Provider   Date  10/22/23    Referring Provider Aleene Passe, MD    Expected Discharge Date 01/13/24      Elliptical   Level 1    Speed 1    Minutes 15    METs 3.5      Rower   Level 1    Watts 45    Minutes 15    METs 3.5      Prescription Details   Frequency (times per week) 3    Duration Progress to 30 minutes of continuous aerobic without signs/symptoms of physical distress      Intensity   THRR 40-80% of Max Heartrate 70-139    Ratings of Perceived Exertion 11-13    Perceived Dyspnea 0-4      Progression   Progression Continue progressive overload as per policy without signs/symptoms or physical distress.      Resistance Training   Training Prescription Yes    Weight 5    Reps 10-15             Perform Capillary Blood Glucose checks as needed.  Exercise Prescription Changes:   Exercise Prescription Changes     Row Name 10/26/23 1400 11/06/23 1500           Response to Exercise   Blood Pressure (Admit) 124/82 112/72      Blood Pressure (Exercise) 128/82 120/80      Blood Pressure (Exit) 114/72 114/78      Heart Rate (Admit) 79 bpm 90 bpm      Heart Rate (Exercise) 144 bpm 113 bpm      Heart Rate (Exit) 89 bpm 75 bpm      Rating of Perceived Exertion (Exercise) 10 13      Symptoms None None      Comments Pt's first day in the CRP2 program Reviewed METs      Duration Continue with 30 min of aerobic exercise without signs/symptoms of physical distress. Continue with 30 min of aerobic exercise without signs/symptoms of physical distress.      Intensity THRR unchanged THRR unchanged        Progression   Progression Continue to progress workloads to maintain intensity without signs/symptoms of physical distress. Continue to progress workloads to maintain intensity without signs/symptoms of physical distress.      Average METs 4.5 4.75        Resistance  Training   Training Prescription Yes Yes      Weight 5 5      Reps 10-15 10-15      Time 10 Minutes 10 Minutes         Interval Training   Interval Training No No        Elliptical   Level 1 1      Speed 1 1      Minutes 15 15      METs 4.6 4.5        Rower   Level 1 6      Watts 29 56      Minutes 15 15      METs 4.46 4.96               Exercise Comments:   Exercise Comments     Row Name 10/26/23 1426 11/06/23 1555 11/20/23 1500       Exercise Comments Pt's first day in the CRP2 program. Pt exercised without complaints. Reviewed METs with patient today. Pt is making good progress. Pt due for MET and goal review on 11/23/23.  Pt was seen in ER and not cleared to return. Will complete upon patient's return.              Exercise Goals and Review:   Exercise Goals     Row Name 10/22/23 1029             Exercise Goals   Increase Physical Activity Yes       Intervention Provide advice, education, support and counseling about physical activity/exercise needs.;Develop an individualized exercise prescription for aerobic and resistive training based on initial evaluation findings, risk stratification, comorbidities and participant's personal goals.       Expected Outcomes Short Term: Attend rehab on a regular basis to increase amount of physical activity.;Long Term: Exercising regularly at least 3-5 days a week.;Long Term: Add in home exercise to make exercise part of routine and to increase amount of physical activity.       Increase Strength and Stamina Yes       Intervention Provide advice, education, support and counseling about physical activity/exercise needs.;Develop an individualized exercise prescription for aerobic and resistive training based on initial evaluation findings, risk stratification, comorbidities and participant's personal goals.       Expected Outcomes Short Term: Increase workloads from initial exercise prescription for resistance, speed, and METs.;Long Term: Improve cardiorespiratory fitness, muscular endurance and strength as measured by increased METs and  functional capacity ( );Short Term: Perform resistance training exercises routinely during rehab and add in resistance training at home       Able to understand and use rate of perceived exertion (RPE) scale Yes       Intervention Provide education and explanation on how to use RPE scale       Expected Outcomes Short Term: Able to use RPE daily in rehab to express subjective intensity level;Long Term:  Able to use RPE to guide intensity level when exercising independently       Knowledge and understanding of Target Heart Rate Range (THRR) Yes       Intervention Provide education and explanation of THRR including how the numbers were predicted and where they are located for reference       Expected Outcomes Short Term: Able to state/look up THRR;Short Term: Able to use daily as guideline for intensity in rehab;Long Term: Able to use THRR to govern intensity when exercising independently  Understanding of Exercise Prescription Yes       Intervention Provide education, explanation, and written materials on patient's individual exercise prescription       Expected Outcomes Short Term: Able to explain program exercise prescription;Long Term: Able to explain home exercise prescription to exercise independently                Exercise Goals Re-Evaluation :  Exercise Goals Re-Evaluation     Row Name 10/26/23 1424             Exercise Goal Re-Evaluation   Exercise Goals Review Increase Physical Activity;Understanding of Exercise Prescription;Increase Strength and Stamina;Knowledge and understanding of Target Heart Rate Range (THRR);Able to understand and use rate of perceived exertion (RPE) scale       Comments Pt's first day in the CRP2 program. Pt understands the exercise Rx, RPE scale and THRR.       Expected Outcomes Will continue to monitor the patient and progress exericse workloads as tolerated.                Discharge Exercise Prescription (Final Exercise Prescription  Changes):  Exercise Prescription Changes - 11/06/23 1500       Response to Exercise   Blood Pressure (Admit) 112/72    Blood Pressure (Exercise) 120/80    Blood Pressure (Exit) 114/78    Heart Rate (Admit) 90 bpm    Heart Rate (Exercise) 113 bpm    Heart Rate (Exit) 75 bpm    Rating of Perceived Exertion (Exercise) 13    Symptoms None    Comments Reviewed METs    Duration Continue with 30 min of aerobic exercise without signs/symptoms of physical distress.    Intensity THRR unchanged      Progression   Progression Continue to progress workloads to maintain intensity without signs/symptoms of physical distress.    Average METs 4.75      Resistance Training   Training Prescription Yes    Weight 5    Reps 10-15    Time 10 Minutes      Interval Training   Interval Training No      Elliptical   Level 1    Speed 1    Minutes 15    METs 4.5      Rower   Level 6    Watts 56    Minutes 15    METs 4.96             Nutrition:  Target Goals: Understanding of nutrition guidelines, daily intake of sodium 1500mg , cholesterol 200mg , calories 30% from fat and 7% or less from saturated fats, daily to have 5 or more servings of fruits and vegetables.  Biometrics:  Pre Biometrics - 10/22/23 1115       Pre Biometrics   Waist Circumference 44.5 inches    Hip Circumference 43.5 inches    Waist to Hip Ratio 1.02 %    Triceps Skinfold 15 mm    % Body Fat 30.4 %    Grip Strength 56 kg    Flexibility 0 in   could not reach   Single Leg Stand 11.32 seconds              Nutrition Therapy Plan and Nutrition Goals:  Nutrition Therapy & Goals - 10/26/23 1336       Nutrition Therapy   Diet Heart Healthy Diet    Drug/Food Interactions Statins/Certain Fruits      Personal Nutrition Goals   Nutrition Goal Patient  to identify strategies for reducing cardiovascular risk by attending the Pritikin education and nutrition series weekly.    Personal Goal #2 Patient to  improve diet quality by using the plate method as a guide for meal planning to include lean protein/plant protein, fruits, vegetables, whole grains, nonfat dairy as part of a well-balanced diet.    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 His LDL remains above goal of <44 (79, treated with crestor ). Patient will benefit from participation in intensive cardiac rehab for nutrition, exercise, and lifestyle modification.      Intervention Plan   Intervention Prescribe, educate and counsel regarding individualized specific dietary modifications aiming towards targeted core components such as weight, hypertension, lipid management, diabetes, heart failure and other comorbidities.;Nutrition handout(s) given to patient.    Expected Outcomes Short Term Goal: Understand basic principles of dietary content, such as calories, fat, sodium, cholesterol and nutrients.;Long Term Goal: Adherence to prescribed nutrition plan.             Nutrition Assessments:  MEDIFICTS Score Key: >=70 Need to make dietary changes  40-70 Heart Healthy Diet <= 40 Therapeutic Level Cholesterol Diet   Flowsheet Row CARDIAC REHAB PHASE II EXERCISE from 11/11/2023 in [facility] for Heart, Vascular, & Lung Health  Picture Your Plate Total Score on Admission 75      Picture Your Plate Scores: <59 Unhealthy dietary pattern with much room for improvement. 41-50 Dietary pattern unlikely to meet recommendations for good health and room for improvement. 51-60 More healthful dietary pattern, with some room for improvement.  >60 Healthy dietary pattern, although there may be some specific behaviors that could be improved.    Nutrition Goals Re-Evaluation:  Nutrition Goals Re-Evaluation     Row Name 10/26/23 1336             Goals   Current Weight 243 lb 2.7 oz (110.3 kg)       Comment LDL 79, HDL 40, lipoprotein A WNL, A1c WNL       Expected Outcome [name] has medical history  of NASH, CAD, NSTEMI. His LDL remains above goal of <44 (79, treated with crestor ). Patient will benefit from participation in intensive cardiac rehab for nutrition, exercise, and lifestyle modification.                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 His LDL remains above goal of <44 (79, treated with crestor ). Patient will benefit from participation in intensive cardiac rehab for nutrition, exercise, and lifestyle modification.                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 His LDL remains above goal of <44 (79, treated with crestor ). Patient will benefit from participation in intensive cardiac rehab for nutrition, exercise, and lifestyle modification.  Psychosocial: Target Goals: Acknowledge presence or absence of significant depression and/or stress, maximize coping skills, provide positive support system. Participant is able to verbalize types and ability to use techniques and skills needed for reducing stress and depression.  Initial Review & Psychosocial Screening:  Initial Psych Review & Screening - 10/22/23 1117       Initial Review   Current issues with Current Depression;Current Stress Concerns    Source of Stress Concerns Chronic Illness;Unable to participate in former interests or hobbies    Comments [name] shared that he has had some feelings of depression/stress since his MI due to being unable to participate in former activites/hobbies. He  says the changes since his MI have been tough to manage, especially some of the medication adjustments like coming off Strattera . [name] is already establishde with a psychologist and psychiatrist, and denies any need for further resources at this time.      Family Dynamics   Good Support System? Yes   [name] has his wife for support     Barriers   Psychosocial barriers to participate in program The patient should benefit from training in stress management and relaxation.      Screening Interventions   Interventions Encouraged to exercise;Provide feedback about the scores to participant;To provide support and resources with identified psychosocial needs    Expected Outcomes Short Term goal: Utilizing psychosocial counselor, staff and physician to assist with identification of specific Stressors or current issues interfering with healing process. Setting desired goal for each stressor or current issue identified.;Long Term Goal: Stressors or current issues are controlled or eliminated.;Short Term goal: Identification and review with participant of any Quality of Life or Depression concerns found by scoring the questionnaire.;Long Term goal: The participant improves quality of Life and PHQ9 Scores as seen by post scores and/or verbalization of changes             Quality of Life Scores:  Quality of Life - 10/22/23 1353       Quality of Life   Select Quality of Life      Quality of Life Scores   Health/Function Pre 12.2 %    Socioeconomic Pre 18 %    Psych/Spiritual Pre 13.93 %    Family Pre 19.2 %    GLOBAL Pre 14.78 %            Scores of 19 and below usually indicate a poorer quality of life in these areas.  A difference of  2-3 points is a clinically meaningful difference.  A difference of 2-3 points in the total score of the Quality of Life Index has been associated with significant improvement in overall quality of life, self-image, physical symptoms, and general health in  studies assessing change in quality of life.  PHQ-9: Review Flowsheet       10/22/2023  Depression screen PHQ 2/9  Decreased Interest 1  Down, Depressed, Hopeless 1  PHQ - 2 Score 2  Altered sleeping 1  Tired, decreased energy 1  Change in appetite 1  Feeling bad or failure about yourself  1  Trouble concentrating 1  Moving slowly or fidgety/restless 1  Suicidal thoughts 0  PHQ-9 Score 8  Difficult doing work/chores Somewhat difficult   Interpretation of Total Score  Total Score Depression Severity:  1-4 = Minimal depression, 5-9 = Mild depression, 10-14 = Moderate depression, 15-19 = Moderately severe depression, 20-27 = Severe depression   Psychosocial Evaluation and Intervention:   Psychosocial Re-Evaluation:  Psychosocial Re-Evaluation  Row Name 10/26/23 1411 11/11/23 1658 12/08/23 0917         Psychosocial Re-Evaluation   Current issues with Current Depression;Current Stress Concerns Current Depression;Current Stress Concerns Current Depression;Current Stress Concerns     Comments No concerns or stressors voiced on first day of exercise. Quality of life and PHq2-9 reviewed on 11/09/23. Pt admits to feeling depressed/down post his cardiac event. Pt stated he has a hx of bipolar takes mood stabilizers and sees a therapist regularly. RN encouraged to reach out to staff if he needs additional resources or a referral. Unablle to reassess. Exercise is currently on hold.     Expected Outcomes [name] will hyave controlled or decreased stress/depression upon completion of cardiac rehab [name] will hyave controlled or decreased stress/depression upon completion of cardiac rehab [name] will hyave controlled or decreased stress/depression upon completion of cardiac rehab     Interventions Stress management education;Relaxation education;Encouraged to attend Cardiac Rehabilitation for the exercise Stress management education;Relaxation education;Encouraged to attend Cardiac  Rehabilitation for the exercise Stress management education;Relaxation education;Encouraged to attend Cardiac Rehabilitation for the exercise     Continue Psychosocial Services  Follow up required by staff Follow up required by staff Follow up required by staff       Initial Review   Source of Stress Concerns Chronic Illness Chronic Illness Chronic Illness     Comments Will continue to montior and offer support as needed. Will continue to montior and offer support as needed. Will continue to montior and offer support as needed.              Psychosocial Discharge (Final Psychosocial Re-Evaluation):  Psychosocial Re-Evaluation - 12/08/23 0917       Psychosocial Re-Evaluation   Current issues with Current Depression;Current Stress Concerns    Comments Unablle to reassess. Exercise is currently on hold.    Expected Outcomes [name] will hyave controlled or decreased stress/depression upon completion of cardiac rehab    Interventions Stress management education;Relaxation education;Encouraged to attend Cardiac Rehabilitation for the exercise    Continue Psychosocial Services  Follow up required by staff      Initial Review   Source of Stress Concerns Chronic Illness    Comments Will continue to montior and offer support as needed.             Vocational Rehabilitation: Provide vocational rehab assistance to qualifying candidates.   Vocational Rehab Evaluation & Intervention:  Vocational Rehab - 10/22/23 1120       Initial Vocational Rehab Evaluation & Intervention   Assessment shows need for Vocational Rehabilitation No   [name] is working            Education: Education Goals: Education classes will be provided on a weekly basis, covering required topics. Participant will state understanding/return demonstration of topics presented.    Education     Row Name 10/26/23 1300     Education   Cardiac Education Topics Pritikin   Architect Exercise Physiologist   Select Psychosocial   Psychosocial Healthy Minds, Bodies, Hearts   Instruction Review Code 1- Verbalizes Understanding   Class Start Time 1357   Class Stop Time 1429   Class Time Calculation (min) 32 min    Row Name 10/28/23 1400     Education   Cardiac Education Topics Pritikin   Nurse, Children's   Educator Nurse   Select Nutrition   Nutrition  Becoming a Neurosurgeon   Instruction Review Code 1- Verbalizes Understanding   Class Start Time 1356    Row Name 11/02/23 1400     Education   Cardiac Education Topics Pritikin   Nurse, Children's Exercise Physiologist   Select Nutrition   Nutrition Other   Instruction Review Code 1- Verbalizes Understanding   Class Start Time 1356   Class Stop Time 1430   Class Time Calculation (min) 34 min    Row Name 11/06/23 1400     Education   Cardiac Education Topics Pritikin   Glass Blower/designer Nutrition   Nutrition Workshop Label Reading   Instruction Review Code 1- Verbalizes Understanding   Class Start Time 1401   Class Stop Time 1433   Class Time Calculation (min) 32 min    Row Name 11/09/23 1600     Education   Cardiac Education Topics Pritikin   [employer];Workshops     Garment/textile Technologist Psychosocial   Psychosocial Workshop Recognizing and Reducing Stress   Instruction Review Code 1- Verbalizes Understanding   Class Start Time 1359   Class Stop Time 1445   Class Time Calculation (min) 46 min    Row Name 11/11/23 1500     Education   Cardiac Education Topics Pritikin   Orthoptist   Educator Dietitian   Weekly Topic Efficiency Cooking - Meals in a Snap   Instruction Review Code 1- Verbalizes Understanding   Class Start Time 1358   Class Stop Time 1438   Class Time Calculation (min) 40 min    Row Name  11/13/23 1500     Education   Cardiac Education Topics Pritikin   Nurse, Children's   Educator Dietitian   Select Nutrition   Nutrition Calorie Density   Instruction Review Code 1- Verbalizes Understanding   Class Start Time 1406   Class Stop Time 1501   Class Time Calculation (min) 55 min    Row Name 11/16/23 1600     Education   Cardiac Education Topics Pritikin   Geographical Information Systems Officer Exercise   Instruction Review Code 1- Verbalizes Understanding   Class Start Time 1400   Class Stop Time 1450   Class Time Calculation (min) 50 min            Core Videos: Exercise    Move It!  Clinical staff conducted group or individual video education with verbal and written material and guidebook.  Patient learns the recommended Pritikin exercise program. Exercise with the goal of living a long, healthy life. Some of the health benefits of exercise include controlled diabetes, healthier blood pressure levels, improved cholesterol levels, improved heart and lung capacity, improved sleep, and better body composition. Everyone should speak with their doctor before starting or changing an exercise routine.  Biomechanical Limitations Clinical staff conducted group or individual video education with verbal and written material and guidebook.  Patient learns how biomechanical limitations can impact exercise and how we can mitigate and possibly overcome limitations to have an impactful and balanced exercise routine.  Body Composition Clinical staff conducted group or individual video education with verbal and written material and guidebook.  Patient learns that body composition (ratio  of muscle mass to fat mass) is a key component to assessing overall fitness, rather than body weight alone. Increased fat mass, especially visceral belly fat, can put us  at increased risk for metabolic syndrome, type 2 diabetes, heart disease,  and even death. It is recommended to combine diet and exercise (cardiovascular and resistance training) to improve your body composition. Seek guidance from your physician and exercise physiologist before implementing an exercise routine.  Exercise Action Plan Clinical staff conducted group or individual video education with verbal and written material and guidebook.  Patient learns the recommended strategies to achieve and enjoy long-term exercise adherence, including variety, self-motivation, self-efficacy, and positive decision making. Benefits of exercise include fitness, good health, weight management, more energy, better sleep, less stress, and overall well-being.  Medical   Heart Disease Risk Reduction Clinical staff conducted group or individual video education with verbal and written material and guidebook.  Patient learns our heart is our most vital organ as it circulates oxygen, nutrients, white blood cells, and hormones throughout the entire body, and carries waste away. Data supports a plant-based eating plan like the Pritikin Program for its effectiveness in slowing progression of and reversing heart disease. The video provides a number of recommendations to address heart disease.   Metabolic Syndrome and Belly Fat  Clinical staff conducted group or individual video education with verbal and written material and guidebook.  Patient learns what metabolic syndrome is, how it leads to heart disease, and how one can reverse it and keep it from coming back. You have metabolic syndrome if you have 3 of the following 5 criteria: abdominal obesity, high blood pressure, high triglycerides, low HDL cholesterol, and high blood sugar.  Hypertension and Heart Disease Clinical staff conducted group or individual video education with verbal and written material and guidebook.  Patient learns that high blood pressure, or hypertension, is very common in the [place] . Hypertension is largely due to  excessive salt intake, but other important risk factors include being overweight, physical inactivity, drinking too much alcohol, smoking, and not eating enough potassium from fruits and vegetables. High blood pressure is a leading risk factor for heart attack, stroke, congestive heart failure, dementia, kidney failure, and premature death. Long-term effects of excessive salt intake include stiffening of the arteries and thickening of heart muscle and organ damage. Recommendations include ways to reduce hypertension and the risk of heart disease.  Diseases of Our Time - Focusing on Diabetes Clinical staff conducted group or individual video education with verbal and written material and guidebook.  Patient learns why the best way to stop diseases of our time is prevention, through food and other lifestyle changes. Medicine (such as prescription pills and surgeries) is often only a Band-Aid on the problem, not a long-term solution. Most common diseases of our time include obesity, type 2 diabetes, hypertension, heart disease, and cancer. The Pritikin Program is recommended and has been proven to help reduce, reverse, and/or prevent the damaging effects of metabolic syndrome.  Nutrition   Overview of the Pritikin Eating Plan  Clinical staff conducted group or individual video education with verbal and written material and guidebook.  Patient learns about the Pritikin Eating Plan for disease risk reduction. The Pritikin Eating Plan emphasizes a wide variety of unrefined, minimally-processed carbohydrates, like fruits, vegetables, whole grains, and legumes. Go, Caution, and Stop food choices are explained. Plant-based and lean animal proteins are emphasized. Rationale provided for low sodium intake for blood pressure control, low added sugars for blood sugar  stabilization, and low added fats and oils for coronary artery disease risk reduction and weight management.  Calorie Density  Clinical staff conducted  group or individual video education with verbal and written material and guidebook.  Patient learns about calorie density and how it impacts the Pritikin Eating Plan. Knowing the characteristics of the food you choose will help you decide whether those foods will lead to weight gain or weight loss, and whether you want to consume more or less of them. Weight loss is usually a side effect of the Pritikin Eating Plan because of its focus on low calorie-dense foods.  Label Reading  Clinical staff conducted group or individual video education with verbal and written material and guidebook.  Patient learns about the Pritikin recommended label reading guidelines and corresponding recommendations regarding calorie density, added sugars, sodium content, and whole grains.  Dining Out - Part 1  Clinical staff conducted group or individual video education with verbal and written material and guidebook.  Patient learns that restaurant meals can be sabotaging because they can be so high in calories, fat, sodium, and/or sugar. Patient learns recommended strategies on how to positively address this and avoid unhealthy pitfalls.  Facts on Fats  Clinical staff conducted group or individual video education with verbal and written material and guidebook.  Patient learns that lifestyle modifications can be just as effective, if not more so, as many medications for lowering your risk of heart disease. A Pritikin lifestyle can help to reduce your risk of inflammation and atherosclerosis (cholesterol build-up, or plaque, in the artery walls). Lifestyle interventions such as dietary choices and physical activity address the cause of atherosclerosis. A review of the types of fats and their impact on blood cholesterol levels, along with dietary recommendations to reduce fat intake is also included.  Nutrition Action Plan  Clinical staff conducted group or individual video education with verbal and written material and  guidebook.  Patient learns how to incorporate Pritikin recommendations into their lifestyle. Recommendations include planning and keeping personal health goals in mind as an important part of their success.  Healthy Mind-Set    Healthy Minds, Bodies, Hearts  Clinical staff conducted group or individual video education with verbal and written material and guidebook.  Patient learns how to identify when they are stressed. Video will discuss the impact of that stress, as well as the many benefits of stress management. Patient will also be introduced to stress management techniques. The way we think, act, and feel has an impact on our hearts.  How Our Thoughts Can Heal Our Hearts  Clinical staff conducted group or individual video education with verbal and written material and guidebook.  Patient learns that negative thoughts can cause depression and anxiety. This can result in negative lifestyle behavior and serious health problems. Cognitive behavioral therapy is an effective method to help control our thoughts in order to change and improve our emotional outlook.  Additional Videos:  Exercise    Improving Performance  Clinical staff conducted group or individual video education with verbal and written material and guidebook.  Patient learns to use a non-linear approach by alternating intensity levels and lengths of time spent exercising to help burn more calories and lose more body fat. Cardiovascular exercise helps improve heart health, metabolism, hormonal balance, blood sugar control, and recovery from fatigue. Resistance training improves strength, endurance, balance, coordination, reaction time, metabolism, and muscle mass. Flexibility exercise improves circulation, posture, and balance. Seek guidance from your physician and exercise physiologist before implementing an  exercise routine and learn your capabilities and proper form for all exercise.  Introduction to Yoga  Clinical staff  conducted group or individual video education with verbal and written material and guidebook.  Patient learns about yoga, a discipline of the coming together of mind, breath, and body. The benefits of yoga include improved flexibility, improved range of motion, better posture and core strength, increased lung function, weight loss, and positive self-image. Yoga's heart health benefits include lowered blood pressure, healthier heart rate, decreased cholesterol and triglyceride levels, improved immune function, and reduced stress. Seek guidance from your physician and exercise physiologist before implementing an exercise routine and learn your capabilities and proper form for all exercise.  Medical   Aging: Enhancing Your Quality of Life  Clinical staff conducted group or individual video education with verbal and written material and guidebook.  Patient learns key strategies and recommendations to stay in good physical health and enhance quality of life, such as prevention strategies, having an advocate, securing a Health Care Proxy and Power of Attorney, and keeping a list of medications and system for tracking them. It also discusses how to avoid risk for bone loss.  Biology of Weight Control  Clinical staff conducted group or individual video education with verbal and written material and guidebook.  Patient learns that weight gain occurs because we consume more calories than we burn (eating more, moving less). Even if your body weight is normal, you may have higher ratios of fat compared to muscle mass. Too much body fat puts you at increased risk for cardiovascular disease, heart attack, stroke, type 2 diabetes, and obesity-related cancers. In addition to exercise, following the Pritikin Eating Plan can help reduce your risk.  Decoding Lab Results  Clinical staff conducted group or individual video education with verbal and written material and guidebook.  Patient learns that lab test reflects one  measurement whose values change over time and are influenced by many factors, including medication, stress, sleep, exercise, food, hydration, pre-existing medical conditions, and more. It is recommended to use the knowledge from this video to become more involved with your lab results and evaluate your numbers to speak with your doctor.   Diseases of Our Time - Overview  Clinical staff conducted group or individual video education with verbal and written material and guidebook.  Patient learns that according to the CDC, 50% to 70% of chronic diseases (such as obesity, type 2 diabetes, elevated lipids, hypertension, and heart disease) are avoidable through lifestyle improvements including healthier food choices, listening to satiety cues, and increased physical activity.  Sleep Disorders Clinical staff conducted group or individual video education with verbal and written material and guidebook.  Patient learns how good quality and duration of sleep are important to overall health and well-being. Patient also learns about sleep disorders and how they impact health along with recommendations to address them, including discussing with a physician.  Nutrition  Dining Out - Part 2 Clinical staff conducted group or individual video education with verbal and written material and guidebook.  Patient learns how to plan ahead and communicate in order to maximize their dining experience in a healthy and nutritious manner. Included are recommended food choices based on the type of restaurant the patient is visiting.   Fueling a Banker conducted group or individual video education with verbal and written material and guidebook.  There is a strong connection between our food choices and our health. Diseases like obesity and type 2 diabetes are very  prevalent and are in large-part due to lifestyle choices. The Pritikin Eating Plan provides plenty of food and hunger-curbing satisfaction. It is  easy to follow, affordable, and helps reduce health risks.  Menu Workshop  Clinical staff conducted group or individual video education with verbal and written material and guidebook.  Patient learns that restaurant meals can sabotage health goals because they are often packed with calories, fat, sodium, and sugar. Recommendations include strategies to plan ahead and to communicate with the manager, chef, or server to help order a healthier meal.  Planning Your Eating Strategy  Clinical staff conducted group or individual video education with verbal and written material and guidebook.  Patient learns about the Pritikin Eating Plan and its benefit of reducing the risk of disease. The Pritikin Eating Plan does not focus on calories. Instead, it emphasizes high-quality, nutrient-rich foods. By knowing the characteristics of the foods, we choose, we can determine their calorie density and make informed decisions.  Targeting Your Nutrition Priorities  Clinical staff conducted group or individual video education with verbal and written material and guidebook.  Patient learns that lifestyle habits have a tremendous impact on disease risk and progression. This video provides eating and physical activity recommendations based on your personal health goals, such as reducing LDL cholesterol, losing weight, preventing or controlling type 2 diabetes, and reducing high blood pressure.  Vitamins and Minerals  Clinical staff conducted group or individual video education with verbal and written material and guidebook.  Patient learns different ways to obtain key vitamins and minerals, including through a recommended healthy diet. It is important to discuss all supplements you take with your doctor.   Healthy Mind-Set    Smoking Cessation  Clinical staff conducted group or individual video education with verbal and written material and guidebook.  Patient learns that cigarette smoking and tobacco addiction pose  a serious health risk which affects millions of people. Stopping smoking will significantly reduce the risk of heart disease, lung disease, and many forms of cancer. Recommended strategies for quitting are covered, including working with your doctor to develop a successful plan.  Culinary   Becoming a Set Designer conducted group or individual video education with verbal and written material and guidebook.  Patient learns that cooking at home can be healthy, cost-effective, quick, and puts them in control. Keys to cooking healthy recipes will include looking at your recipe, assessing your equipment needs, planning ahead, making it simple, choosing cost-effective seasonal ingredients, and limiting the use of added fats, salts, and sugars.  Cooking - Breakfast and Snacks  Clinical staff conducted group or individual video education with verbal and written material and guidebook.  Patient learns how important breakfast is to satiety and nutrition through the entire day. Recommendations include key foods to eat during breakfast to help stabilize blood sugar levels and to prevent overeating at meals later in the day. Planning ahead is also a key component.  Cooking - Educational Psychologist conducted group or individual video education with verbal and written material and guidebook.  Patient learns eating strategies to improve overall health, including an approach to cook more at home. Recommendations include thinking of animal protein as a side on your plate rather than center stage and focusing instead on lower calorie dense options like vegetables, fruits, whole grains, and plant-based proteins, such as beans. Making sauces in large quantities to freeze for later and leaving the skin on your vegetables are also recommended to maximize your experience.  Cooking - Healthy Salads and Dressing Clinical staff conducted group or individual video education with verbal and written  material and guidebook.  Patient learns that vegetables, fruits, whole grains, and legumes are the foundations of the Pritikin Eating Plan. Recommendations include how to incorporate each of these in flavorful and healthy salads, and how to create homemade salad dressings. Proper handling of ingredients is also covered. Cooking - Soups and [employer] - Soups and Desserts Clinical staff conducted group or individual video education with verbal and written material and guidebook.  Patient learns that Pritikin soups and desserts make for easy, nutritious, and delicious snacks and meal components that are low in sodium, fat, sugar, and calorie density, while high in vitamins, minerals, and filling fiber. Recommendations include simple and healthy ideas for soups and desserts.   Overview     The Pritikin Solution Program Overview Clinical staff conducted group or individual video education with verbal and written material and guidebook.  Patient learns that the results of the Pritikin Program have been documented in more than 100 articles published in peer-reviewed journals, and the benefits include reducing risk factors for (and, in some cases, even reversing) high cholesterol, high blood pressure, type 2 diabetes, obesity, and more! An overview of the three key pillars of the Pritikin Program will be covered: eating well, doing regular exercise, and having a healthy mind-set.  WORKSHOPS  Exercise: Exercise Basics: Building Your Action Plan Clinical staff led group instruction and group discussion with PowerPoint presentation and patient guidebook. To enhance the learning environment the use of posters, models and videos may be added. At the conclusion of this workshop, patients will comprehend the difference between physical activity and exercise, as well as the benefits of incorporating both, into their routine. Patients will understand the FITT (Frequency, Intensity, Time, and Type) principle  and how to use it to build an exercise action plan. In addition, safety concerns and other considerations for exercise and cardiac rehab will be addressed by the presenter. The purpose of this lesson is to promote a comprehensive and effective weekly exercise routine in order to improve patients' overall level of fitness.   Managing Heart Disease: Your Path to a Healthier Heart Clinical staff led group instruction and group discussion with PowerPoint presentation and patient guidebook. To enhance the learning environment the use of posters, models and videos may be added.At the conclusion of this workshop, patients will understand the anatomy and physiology of the heart. Additionally, they will understand how Pritikin's three pillars impact the risk factors, the progression, and the management of heart disease.  The purpose of this lesson is to provide a high-level overview of the heart, heart disease, and how the Pritikin lifestyle positively impacts risk factors.  Exercise Biomechanics Clinical staff led group instruction and group discussion with PowerPoint presentation and patient guidebook. To enhance the learning environment the use of posters, models and videos may be added. Patients will learn how the structural parts of their bodies function and how these functions impact their daily activities, movement, and exercise. Patients will learn how to promote a neutral spine, learn how to manage pain, and identify ways to improve their physical movement in order to promote healthy living. The purpose of this lesson is to expose patients to common physical limitations that impact physical activity. Participants will learn practical ways to adapt and manage aches and pains, and to minimize their effect on regular exercise. Patients will learn how to maintain good posture while sitting, walking, and  lifting.  Balance Training and Fall Prevention  Clinical staff led group instruction and group  discussion with PowerPoint presentation and patient guidebook. To enhance the learning environment the use of posters, models and videos may be added. At the conclusion of this workshop, patients will understand the importance of their sensorimotor skills (vision, proprioception, and the vestibular system) in maintaining their ability to balance as they age. Patients will apply a variety of balancing exercises that are appropriate for their current level of function. Patients will understand the common causes for poor balance, possible solutions to these problems, and ways to modify their physical environment in order to minimize their fall risk. The purpose of this lesson is to teach patients about the importance of maintaining balance as they age and ways to minimize their risk of falling.  WORKSHOPS   Nutrition:  Fueling a Ship Broker led group instruction and group discussion with PowerPoint presentation and patient guidebook. To enhance the learning environment the use of posters, models and videos may be added. Patients will review the foundational principles of the Pritikin Eating Plan and understand what constitutes a serving size in each of the food groups. Patients will also learn Pritikin-friendly foods that are better choices when away from home and review make-ahead meal and snack options. Calorie density will be reviewed and applied to three nutrition priorities: weight maintenance, weight loss, and weight gain. The purpose of this lesson is to reinforce (in a group setting) the key concepts around what patients are recommended to eat and how to apply these guidelines when away from home by planning and selecting Pritikin-friendly options. Patients will understand how calorie density may be adjusted for different weight management goals.  Mindful Eating  Clinical staff led group instruction and group discussion with PowerPoint presentation and patient guidebook. To enhance  the learning environment the use of posters, models and videos may be added. Patients will briefly review the concepts of the Pritikin Eating Plan and the importance of low-calorie dense foods. The concept of mindful eating will be introduced as well as the importance of paying attention to internal hunger signals. Triggers for non-hunger eating and techniques for dealing with triggers will be explored. The purpose of this lesson is to provide patients with the opportunity to review the basic principles of the Pritikin Eating Plan, discuss the value of eating mindfully and how to measure internal cues of hunger and fullness using the Hunger Scale. Patients will also discuss reasons for non-hunger eating and learn strategies to use for controlling emotional eating.  Targeting Your Nutrition Priorities Clinical staff led group instruction and group discussion with PowerPoint presentation and patient guidebook. To enhance the learning environment the use of posters, models and videos may be added. Patients will learn how to determine their genetic susceptibility to disease by reviewing their family history. Patients will gain insight into the importance of diet as part of an overall healthy lifestyle in mitigating the impact of genetics and other environmental insults. The purpose of this lesson is to provide patients with the opportunity to assess their personal nutrition priorities by looking at their family history, their own health history and current risk factors. Patients will also be able to discuss ways of prioritizing and modifying the Pritikin Eating Plan for their highest risk areas  Menu  Clinical staff led group instruction and group discussion with PowerPoint presentation and patient guidebook. To enhance the learning environment the use of posters, models and videos may be added. Using menus  brought in from [employer], or printed from [employer], patients will apply the Pritikin dining out  guidelines that were presented in the [employer] video. Patients will also be able to practice these guidelines in a variety of provided scenarios. The purpose of this lesson is to provide patients with the opportunity to practice hands-on learning of the Pritikin Dining Out guidelines with actual menus and practice scenarios.  Label Reading Clinical staff led group instruction and group discussion with PowerPoint presentation and patient guidebook. To enhance the learning environment the use of posters, models and videos may be added. Patients will review and discuss the Pritikin label reading guidelines presented in Pritikin's Label Reading Educational series video. Using fool labels brought in from local grocery stores and markets, patients will apply the label reading guidelines and determine if the packaged food meet the Pritikin guidelines. The purpose of this lesson is to provide patients with the opportunity to review, discuss, and practice hands-on learning of the Pritikin Label Reading guidelines with actual packaged food labels. Cooking School  Pritikin's [employer] are designed to teach patients ways to prepare quick, simple, and affordable recipes at home. The importance of nutrition's role in chronic disease risk reduction is reflected in its emphasis in the overall Pritikin program. By learning how to prepare essential core Pritikin Eating Plan recipes, patients will increase control over what they eat; be able to customize the flavor of foods without the use of added salt, sugar, or fat; and improve the quality of the food they consume. By learning a set of core recipes which are easily assembled, quickly prepared, and affordable, patients are more likely to prepare more healthy foods at home. These workshops focus on convenient breakfasts, simple entres, side dishes, and desserts which can be prepared with minimal effort and are consistent with nutrition  recommendations for cardiovascular risk reduction. Cooking [employer] are taught by a armed forces logistics/support/administrative officer (RD) who has been trained by the [employer]. The chef or RD has a clear understanding of the importance of minimizing - if not completely eliminating - added fat, sugar, and sodium in recipes. Throughout the series of Cooking School Workshop sessions, patients will learn about healthy ingredients and efficient methods of cooking to build confidence in their capability to prepare    Cooking School weekly topics:  Adding Flavor- Sodium-Free  Fast and Healthy Breakfasts  Powerhouse Plant-Based Proteins  Satisfying Salads and Dressings  Simple Sides and Sauces  International Cuisine-Spotlight on the [employer] Zones  Delicious Desserts  Savory Soups  [employer] - Meals in a Astronomer Appetizers and Snacks  Comforting Weekend Breakfasts  One-Pot Wonders   Fast Evening Meals  Landscape Architect Your Pritikin Plate  WORKSHOPS   Healthy Mindset (Psychosocial):  Focused Goals, Sustainable Changes Clinical staff led group instruction and group discussion with PowerPoint presentation and patient guidebook. To enhance the learning environment the use of posters, models and videos may be added. Patients will be able to apply effective goal setting strategies to establish at least one personal goal, and then take consistent, meaningful action toward that goal. They will learn to identify common barriers to achieving personal goals and develop strategies to overcome them. Patients will also gain an understanding of how our mind-set can impact our ability to achieve goals and the importance of cultivating a positive and growth-oriented mind-set. The purpose of this lesson is to provide patients with a deeper understanding of how  to set and achieve personal goals, as well as the tools and strategies needed to overcome common obstacles which may arise along  the way.  From Head to Heart: The Power of a Healthy Outlook  Clinical staff led group instruction and group discussion with PowerPoint presentation and patient guidebook. To enhance the learning environment the use of posters, models and videos may be added. Patients will be able to recognize and describe the impact of emotions and mood on physical health. They will discover the importance of self-care and explore self-care practices which may work for them. Patients will also learn how to utilize the 4 C's to cultivate a healthier outlook and better manage stress and challenges. The purpose of this lesson is to demonstrate to patients how a healthy outlook is an essential part of maintaining good health, especially as they continue their cardiac rehab journey.  Healthy Sleep for a Healthy Heart Clinical staff led group instruction and group discussion with PowerPoint presentation and patient guidebook. To enhance the learning environment the use of posters, models and videos may be added. At the conclusion of this workshop, patients will be able to demonstrate knowledge of the importance of sleep to overall health, well-being, and quality of life. They will understand the symptoms of, and treatments for, common sleep disorders. Patients will also be able to identify daytime and nighttime behaviors which impact sleep, and they will be able to apply these tools to help manage sleep-related challenges. The purpose of this lesson is to provide patients with a general overview of sleep and outline the importance of quality sleep. Patients will learn about a few of the most common sleep disorders. Patients will also be introduced to the concept of "sleep hygiene," and discover ways to self-manage certain sleeping problems through simple daily behavior changes. Finally, the workshop will motivate patients by clarifying the links between quality sleep and their goals of heart-healthy living.   Recognizing and  Reducing Stress Clinical staff led group instruction and group discussion with PowerPoint presentation and patient guidebook. To enhance the learning environment the use of posters, models and videos may be added. At the conclusion of this workshop, patients will be able to understand the types of stress reactions, differentiate between acute and chronic stress, and recognize the impact that chronic stress has on their health. They will also be able to apply different coping mechanisms, such as reframing negative self-talk. Patients will have the opportunity to practice a variety of stress management techniques, such as deep abdominal breathing, progressive muscle relaxation, and/or guided imagery.  The purpose of this lesson is to educate patients on the role of stress in their lives and to provide healthy techniques for coping with it.  Learning Barriers/Preferences:  Learning Barriers/Preferences - 10/22/23 1120       Learning Barriers/Preferences   Learning Barriers None    Learning Preferences Computer/Internet;Group Instruction;Individual Instruction;Skilled Demonstration;Verbal Instruction;Video             Education Topics:  Knowledge Questionnaire Score:  Knowledge Questionnaire Score - 10/22/23 1123       Knowledge Questionnaire Score   Pre Score 21/24             Core Components/Risk Factors/Patient Goals at Admission:  Personal Goals and Risk Factors at Admission - 10/22/23 1120       Core Components/Risk Factors/Patient Goals on Admission    Weight Management Yes;Obesity;Weight Loss    Intervention Weight Management: Develop a combined nutrition and exercise program designed to reach  desired caloric intake, while maintaining appropriate intake of nutrient and fiber, sodium and fats, and appropriate energy expenditure required for the weight goal.;Weight Management: Provide education and appropriate resources to help participant work on and attain dietary  goals.;Weight Management/Obesity: Establish reasonable short term and long term weight goals.;Obesity: Provide education and appropriate resources to help participant work on and attain dietary goals.    Expected Outcomes Understanding of distribution of calorie intake throughout the day with the consumption of 4-5 meals/snacks;Understanding recommendations for meals to include 15-35% energy as protein, 25-35% energy from fat, 35-60% energy from carbohydrates, less than 200mg  of dietary cholesterol, 20-35 gm of total fiber daily;Weight Loss: Understanding of general recommendations for a balanced deficit meal plan, which promotes 1-2 lb weight loss per week and includes a negative energy balance of [phone] kcal/d;Long Term: Adherence to nutrition and physical activity/exercise program aimed toward attainment of established weight goal;Short Term: Continue to assess and modify interventions until short term weight is achieved    Lipids Yes    Intervention Provide education and support for participant on nutrition & aerobic/resistive exercise along with prescribed medications to achieve LDL 70mg , HDL >40mg .    Expected Outcomes Short Term: Participant states understanding of desired cholesterol values and is compliant with medications prescribed. Participant is following exercise prescription and nutrition guidelines.;Long Term: Cholesterol controlled with medications as prescribed, with individualized exercise RX and with personalized nutrition plan. Value goals: LDL < 70mg , HDL > 40 mg.    Stress Yes    Intervention Offer individual and/or small group education and counseling on adjustment to heart disease, stress management and health-related lifestyle change. Teach and support self-help strategies.;Refer participants experiencing significant psychosocial distress to appropriate mental health specialists for further evaluation and treatment. When possible, include family members and significant others in  education/counseling sessions.    Expected Outcomes Short Term: Participant demonstrates changes in health-related behavior, relaxation and other stress management skills, ability to obtain effective social support, and compliance with psychotropic medications if prescribed.;Long Term: Emotional wellbeing is indicated by absence of clinically significant psychosocial distress or social isolation.             Core Components/Risk Factors/Patient Goals Review:   Goals and Risk Factor Review     Row Name 10/26/23 1413 11/11/23 1700 12/08/23 0917         Core Components/Risk Factors/Patient Goals Review   Personal Goals Review Weight Management/Obesity;Lipids;Stress Weight Management/Obesity;Lipids;Stress Weight Management/Obesity;Lipids;Stress     Review [name] started cardiac rehab on 10/26/23. [name] did well with exercise. Vital signs were stable. [name] has been doing  well with exercise. Vital signs have been  stable. Exercise is currently on hold due to ED visit on 11/17/23. Need MD clearance to return.     Expected Outcomes [name] will conitnue to participate in cardiac rehab for exercise, nutrition and lifestyle modifications. [name] will conitnue to participate in cardiac rehab for exercise, nutrition and lifestyle modifications. [name] will conitnue to participate in cardiac rehab for exercise, nutrition and lifestyle modifications.              Core Components/Risk Factors/Patient Goals at Discharge (Final Review):   Goals and Risk Factor Review - 12/08/23 0917       Core Components/Risk Factors/Patient Goals Review   Personal Goals Review Weight Management/Obesity;Lipids;Stress    Review Exercise is currently on hold due to ED visit on 11/17/23. Need MD clearance to return.    Expected Outcomes [name] will conitnue to participate in cardiac rehab for exercise,  nutrition and lifestyle modifications.             ITP Comments:  ITP Comments     Row Name  10/22/23 1029 10/26/23 1409 11/11/23 1657 12/08/23 0915     ITP Comments Dr. Wilbert Bihari medical director. Introduction to pritikin education/intensive cardiac rehab. Initial orientation packet reviewed with patient. 30 Day ITP Review. [name] started cardiac rehab on 10/26/23. [name] did well with exercise. 30 Day ITP Review. [name] has good attendance and participation in cardiac rehab. 30 Day ITP Review. Exercise is currently on hold due to Ed visit on 11/17/23.             Comments: See ITP comments.Hadassah Elpidio Quan RN BSN

## 2023-12-08 NOTE — Progress Notes (Unsigned)
 Applied a 14 day Zio XT monitor to patient in the office ?

## 2023-12-08 NOTE — Patient Instructions (Addendum)
 Medication Instructions:  Your physician recommends that you continue on your current medications as directed. Please refer to the Current Medication list given to you today.  *If you need a refill on your cardiac medications before your next appointment, please call your pharmacy*  Lab Work: TODAY: CMP, Lipid panel, Hgb A1c If you have labs (blood work) drawn today and your tests are completely normal, you will receive your results only by: MyChart Message (if you have MyChart) OR A paper copy in the mail If you have any lab test that is abnormal or we need to change your treatment, we will call you to review the results.  Testing/Procedures: Your physician has requested that you wear a Zio heart monitor for 14 days. This will be mailed to your home with instructions on how to apply the monitor and how to return it when finished. Please allow 2 weeks after returning the heart monitor before our office calls you with the results.   Follow-Up: At [facility], you and your health needs are our priority.  As part of our continuing mission to provide you with exceptional heart care, we have created designated Provider Care Teams.  These Care Teams include your primary Cardiologist (physician) and Advanced Practice Providers (APPs -  Physician Assistants and Nurse Practitioners) who all work together to provide you with the care you need, when you need it.  Your next appointment:   6 month(s)   **Please provide patient with a list of primary care providers.** The format for your next appointment:   In Person  Provider:   Redell Shallow, MD  Other Instructions You have been referred to [facility], their office will call you to schedule an appointment to meet with a neurologist for consultation regarding your neck pain and left arm tingling.  ZIO XT- Long Term Monitor Instructions     Your physician has requested you wear a ZIO patch monitor for 14 days.  This is a  single patch monitor. [employer] supplies one patch monitor per enrollment. Additional  stickers are not available. Please do not apply patch if you will be having a Nuclear Stress Test,  Echocardiogram, Cardiac CT, MRI, or Chest Xray during the period you would be wearing the  monitor. The patch cannot be worn during these tests. You cannot remove and re-apply the  ZIO XT patch monitor.  Your ZIO patch monitor will be mailed 3 day USPS to your address on file. It may take 3-5 days  to receive your monitor after you have been enrolled.  Once you have received your monitor, please review the enclosed instructions. Your monitor  has already been registered assigning a specific monitor serial # to you.     Billing and Patient Assistance Program Information     We have supplied [employer] with any of your insurance information on file for billing purposes.  [employer] offers a sliding scale Patient Assistance Program for patients that do not have  insurance, or whose insurance does not completely cover the cost of the ZIO monitor.  You must apply for the Patient Assistance Program to qualify for this discounted rate.  To apply, please call [employer] at [phone], select option 4, select option 2, ask to apply for  Patient Assistance Program. Meredeth will ask your household income, and how many people  are in your household. They will quote your out-of-pocket cost based on that information.  [employer] will also be able to set up a 31-month, interest-free payment plan if  needed.     Applying the monitor     Shave hair from upper left chest.  Hold abrader disc by orange tab. Rub abrader in 40 strokes over the upper left chest as  indicated in your monitor instructions.  Clean area with 4 enclosed alcohol pads. Let dry.  Apply patch as indicated in monitor instructions. Patch will be placed under collarbone on left  side of chest with arrow pointing upward.  Rub patch adhesive wings for 2 minutes. Remove  white label marked 1. Remove the white  label marked 2. Rub patch adhesive wings for 2 additional minutes.  While looking in a mirror, press and release button in center of patch. A small green light will  flash 3-4 times. This will be your only indicator that the monitor has been turned on.  Do not shower for the first 24 hours. You may shower after the first 24 hours.  Press the button if you feel a symptom. You will hear a small click. Record Date, Time and  Symptom in the Patient Logbook.  When you are ready to remove the patch, follow instructions on the last 2 pages of Patient  Logbook. Stick patch monitor onto the last page of Patient Logbook.  Place Patient Logbook in the blue and white box. Use locking tab on box and tape box closed  securely. The blue and white box has prepaid postage on it. Please place it in the mailbox as  soon as possible. Your physician should have your test results approximately 7 days after the  monitor has been mailed back to [facility].  Call [facility] Customer Care at [phone] if you have questions regarding  your ZIO XT patch monitor. Call them immediately if you see an orange light blinking on your  monitor.  If your monitor falls off in less than 4 days, contact our Monitor department at [phone].  If your monitor becomes loose or falls off after 4 days call [employer] at [phone] for  suggestions on securing your monitor.

## 2023-12-08 NOTE — Telephone Encounter (Signed)
 Pt c/o medication issue:  1. Name of Medication: atomoxetine  (STRATTERA ) 80 MG capsule   2. How are you currently taking this medication (dosage and times per day)? Take 80 mg by mouth daily.   3. Are you having a reaction (difficulty breathing--STAT)? No  4. What is your medication issue? Pt would like for a note to be put in MyChart stating that he is able to start back taking above medication. Pt needs this for is PCP. Please advise

## 2023-12-09 ENCOUNTER — Ambulatory Visit (HOSPITAL_COMMUNITY): Payer: Medicaid Other

## 2023-12-09 ENCOUNTER — Encounter (HOSPITAL_COMMUNITY)
Admission: RE | Admit: 2023-12-09 | Discharge: 2023-12-09 | Disposition: A | Discharge status: Home / Self Care | Payer: Medicaid Other | Source: Ambulatory Visit | Attending: Cardiovascular Disease | Admitting: Cardiovascular Disease

## 2023-12-09 DIAGNOSIS — I252 Old myocardial infarction: Secondary | ICD-10-CM | POA: Diagnosis not present

## 2023-12-09 DIAGNOSIS — Z955 Presence of coronary angioplasty implant and graft: Secondary | ICD-10-CM | POA: Insufficient documentation

## 2023-12-09 DIAGNOSIS — Z48812 Encounter for surgical aftercare following surgery on the circulatory system: Secondary | ICD-10-CM | POA: Insufficient documentation

## 2023-12-09 DIAGNOSIS — I214 Non-ST elevation (NSTEMI) myocardial infarction: Secondary | ICD-10-CM | POA: Diagnosis present

## 2023-12-09 NOTE — Telephone Encounter (Signed)
 Spoke with nahser during clinic who will addend his previous office note to reflect that he is fine with patient resuming medication: Strattera. Letter printed at this time for patient and placed up front.

## 2023-12-11 ENCOUNTER — Ambulatory Visit (HOSPITAL_COMMUNITY): Payer: Medicaid Other

## 2023-12-11 ENCOUNTER — Telehealth (HOSPITAL_COMMUNITY): Payer: Self-pay

## 2023-12-11 ENCOUNTER — Encounter (HOSPITAL_COMMUNITY): Payer: Medicaid Other

## 2023-12-11 NOTE — Telephone Encounter (Signed)
 Left VM for patient, no classes offered today after 12:30 due to impending weather

## 2023-12-14 ENCOUNTER — Ambulatory Visit (HOSPITAL_COMMUNITY): Payer: Medicaid Other

## 2023-12-14 ENCOUNTER — Encounter (HOSPITAL_COMMUNITY)
Admission: RE | Admit: 2023-12-14 | Discharge: 2023-12-14 | Disposition: A | Discharge status: Home / Self Care | Payer: Medicaid Other | Source: Ambulatory Visit | Attending: Cardiovascular Disease | Admitting: Cardiovascular Disease

## 2023-12-14 DIAGNOSIS — Z955 Presence of coronary angioplasty implant and graft: Secondary | ICD-10-CM | POA: Diagnosis not present

## 2023-12-14 DIAGNOSIS — I214 Non-ST elevation (NSTEMI) myocardial infarction: Secondary | ICD-10-CM

## 2023-12-16 ENCOUNTER — Ambulatory Visit (HOSPITAL_COMMUNITY): Payer: Medicaid Other

## 2023-12-16 ENCOUNTER — Encounter (HOSPITAL_COMMUNITY): Payer: Medicaid Other

## 2023-12-18 ENCOUNTER — Ambulatory Visit (HOSPITAL_COMMUNITY): Payer: Medicaid Other

## 2023-12-18 ENCOUNTER — Encounter (HOSPITAL_COMMUNITY)
Admission: RE | Admit: 2023-12-18 | Discharge: 2023-12-18 | Disposition: A | Discharge status: Home / Self Care | Payer: Medicaid Other | Source: Ambulatory Visit | Attending: Cardiovascular Disease | Admitting: Cardiovascular Disease

## 2023-12-18 DIAGNOSIS — Z955 Presence of coronary angioplasty implant and graft: Secondary | ICD-10-CM

## 2023-12-18 DIAGNOSIS — I214 Non-ST elevation (NSTEMI) myocardial infarction: Secondary | ICD-10-CM

## 2023-12-21 ENCOUNTER — Ambulatory Visit (HOSPITAL_COMMUNITY): Payer: Medicaid Other

## 2023-12-21 ENCOUNTER — Encounter (HOSPITAL_COMMUNITY)
Admission: RE | Admit: 2023-12-21 | Discharge: 2023-12-21 | Disposition: A | Discharge status: Home / Self Care | Payer: Medicaid Other | Source: Ambulatory Visit | Attending: Cardiovascular Disease | Admitting: Cardiovascular Disease

## 2023-12-21 DIAGNOSIS — Z955 Presence of coronary angioplasty implant and graft: Secondary | ICD-10-CM

## 2023-12-21 DIAGNOSIS — I214 Non-ST elevation (NSTEMI) myocardial infarction: Secondary | ICD-10-CM

## 2023-12-23 ENCOUNTER — Ambulatory Visit (HOSPITAL_COMMUNITY): Payer: Medicaid Other

## 2023-12-23 ENCOUNTER — Telehealth (HOSPITAL_COMMUNITY): Payer: Self-pay

## 2023-12-23 ENCOUNTER — Encounter (HOSPITAL_COMMUNITY): Payer: Medicaid Other

## 2023-12-23 NOTE — Telephone Encounter (Signed)
-----   Message from Kristeen Miss sent at 12/22/2023  5:27 PM EST ----- Regarding: RE: Target Heart Rate Increase Ok to increase target HR as requested   PN ----- Message ----- From: Lorin Picket Sent: 12/22/2023   8:18 AM EST To: Cammy Copa, RN; Vesta Mixer, MD Subject: Target Heart Rate Increase                     Good morning Dr. Elease Hashimoto,  I am requesting a THR increase for your patient Billy Andersen. His current THRR is 70-139, which is 40-80% of his MPHR. I am requesting an increase to 90% (157 bpm) if you agree. He wants to increase his workout but has been exceeding his THR. Blood  pressures at rest have ranged from 108-120/62-82, and with exercise 120--140/66-82. Pt has had no complaints with his current exercise prescription.  Please let me know if you require any additional information for this patient. Thank you for considering this request.   Best Regards,  Lorin Picket MS, ACSM-CEP, CCRP

## 2023-12-25 ENCOUNTER — Ambulatory Visit (HOSPITAL_COMMUNITY): Payer: Medicaid Other

## 2023-12-25 ENCOUNTER — Encounter (HOSPITAL_COMMUNITY): Payer: Medicaid Other

## 2023-12-28 ENCOUNTER — Telehealth (HOSPITAL_COMMUNITY): Payer: Self-pay

## 2023-12-28 ENCOUNTER — Ambulatory Visit (HOSPITAL_COMMUNITY): Payer: Medicaid Other

## 2023-12-28 ENCOUNTER — Encounter (HOSPITAL_COMMUNITY): Admission: RE | Admit: 2023-12-28 | Payer: Medicaid Other | Source: Ambulatory Visit

## 2023-12-29 ENCOUNTER — Encounter: Payer: Self-pay | Admitting: Family Medicine

## 2023-12-29 ENCOUNTER — Ambulatory Visit: Payer: Medicaid Other | Admitting: Family Medicine

## 2023-12-29 VITALS — BP 108/74 | HR 80 | Ht 74.0 in | Wt 239.2 lb

## 2023-12-29 DIAGNOSIS — Z23 Encounter for immunization: Secondary | ICD-10-CM

## 2023-12-29 DIAGNOSIS — K7581 Nonalcoholic steatohepatitis (NASH): Secondary | ICD-10-CM | POA: Diagnosis not present

## 2023-12-29 DIAGNOSIS — Z0000 Encounter for general adult medical examination without abnormal findings: Secondary | ICD-10-CM | POA: Diagnosis not present

## 2023-12-29 DIAGNOSIS — Z125 Encounter for screening for malignant neoplasm of prostate: Secondary | ICD-10-CM

## 2023-12-29 DIAGNOSIS — Z1211 Encounter for screening for malignant neoplasm of colon: Secondary | ICD-10-CM

## 2023-12-29 LAB — PSA: PSA: 0.27 ng/mL (ref 0.10–4.00)

## 2023-12-29 NOTE — Progress Notes (Signed)
Office Note 12/29/2023  CC:  Chief Complaint  Patient presents with   Establish Care    Pt was diagnosed with fatty liver in 2021; wants to discuss referral.  Previous PCP: Dr. Aniceto Boss    HPI:  Billy Andersen is a 48 y.o. male who is here to establish care, health maintenance exam. Patient's most recent primary MD: None Old records in [facility] EMR were reviewed prior to or during today's visit.  He reports feeling well other than having some palpitations when he stands.  He has no dizziness, chest pain, or shortness of breath. He just finished wearing outpatient monitoring/Zio and he has cardiology follow-up in 1 week.  He had an MI last year, stent to RCA, has been on aspirin, Plavix, and Crestor. Blood pressure was low so his beta-blocker was discontinued 10/02/2023. He has been doing formal cardiac rehab. He has significantly improved his diet.  He has a history of NASH.  He saw a hepatologist with [facility] in [place].  This was back in 2021 and 2022. Notes document F3 fibrosis.  Serologic evaluation for other underlying causes of liver disease was unremarkable.  No significant alcohol intake. He requests referral to someone in the [facility] network for recheck of this problem.  He sees a psychiatrist for bipolar disorder and ADD. He is on Lamictal 150 mg twice daily, Caplyta 42 mg a day, and Strattera 80 mg a day.  PMP AWARE reviewed today: Nothing is listed.  Past Medical History:  Diagnosis Date   ADHD    Bipolar 1 disorder (HCC)    CAD (coronary artery disease)    Metabolic dysfunction-associated steatohepatitis (MASH)    2021 severe fibrosis (hepatologist)   NSTEMI (non-ST elevated myocardial infarction) (HCC) 09/2023   DES RCA   Polycythemia    2021 hem/onc eval-->?spurius/?transient.  Iron normal   Ruptured plantar fascia    Right, 2022    Past Surgical History:  Procedure Laterality Date   CORONARY STENT  INTERVENTION N/A 09/14/2023   Procedure: CORONARY STENT INTERVENTION;  Surgeon: Yates Decamp, MD;  Location: [facility] INVASIVE CV LAB;  Service: Cardiovascular;  Laterality: N/A;   EXTERNAL EAR SURGERY     LEFT HEART CATH AND CORONARY ANGIOGRAPHY N/A 09/14/2023   Procedure: LEFT HEART CATH AND CORONARY ANGIOGRAPHY;  Surgeon: Yates Decamp, MD;  Location: [facility] CV [facility];  Service: Cardiovascular;  Laterality: N/A;   LEFT HEART CATH AND CORONARY ANGIOGRAPHY N/A 09/22/2023   Procedure: LEFT HEART CATH AND CORONARY ANGIOGRAPHY;  Surgeon: Tonny Bollman, MD;  Location: [facility] CV LAB;  Service: Cardiovascular;  Laterality: N/A;    Family History  Problem Relation Age of Onset   Rectal cancer Mother    Gallbladder disease Mother 78       Cholangiocarcinoma   Heart disease Father 76       CABG    Social History   Socioeconomic History   Marital status: Married    Spouse name: Not on file   Number of children: Not on file   Years of education: Not on file   Highest education level: Some college, no degree  Occupational History   Not on file  Tobacco Use   Smoking status: Never   Smokeless tobacco: Never  Vaping Use   Vaping status: Never Used  Substance and Sexual Activity   Alcohol use: Not Currently   Drug use: Never   Sexual activity: Not on file  Other Topics Concern   Not  on file  Social History Narrative   Married, 2 daughters.   Occupation: Works for [employer] family heritage.   No tobacco, alcohol, or drugs.   Social Drivers of Corporate investment banker Strain: Low Risk  (12/29/2023)   Overall Financial Resource Strain (CARDIA)    Difficulty of Paying Living Expenses: Not very hard  Food Insecurity: Food Insecurity Present (12/29/2023)   Hunger Vital Sign    Worried About Running Out of Food in the Last Year: Sometimes true    Ran Out of Food in the Last Year: Never true  Transportation Needs: No Transportation Needs (12/29/2023)   PRAPARE - Doctor, general practice (Medical): No    Lack of Transportation (Non-Medical): No  Physical Activity: Insufficiently Active (12/29/2023)   Exercise Vital Sign    Days of Exercise per Week: 3 days    Minutes of Exercise per Session: 40 min  Stress: No Stress Concern Present (12/29/2023)   [employer] of Occupational Health - Occupational Stress Questionnaire    Feeling of Stress : Only a little  Social Connections: Moderately Integrated (12/29/2023)   Social Connection and Isolation Panel [NHANES]    Frequency of Communication with Friends and Family: Twice a week    Frequency of Social Gatherings with Friends and Family: Once a week    Attends Religious Services: 1 to 4 times per year    Active Member of [employer] or Organizations: No    Attends Banker Meetings: Not on file    Marital Status: Married  Catering manager Violence: Not At Risk (09/22/2023)   Humiliation, Afraid, Rape, and Kick questionnaire    Fear of Current or Ex-Partner: No    Emotionally Abused: No    Physically Abused: No    Sexually Abused: No    Outpatient Encounter Medications as of 12/29/2023  Medication Sig   aspirin 81 MG chewable tablet Chew 1 tablet (81 mg total) by mouth daily.   atomoxetine (STRATTERA) 80 MG capsule Take 80 mg by mouth daily.   CAPLYTA 42 MG capsule Take 42 mg by mouth at bedtime.   clopidogrel (PLAVIX) 75 MG tablet Take 1 tablet (75 mg total) by mouth daily with breakfast.   lamoTRIgine (LAMICTAL) 200 MG tablet Take 200 mg by mouth daily.   Misc Natural Products (AIRBORNE ELDERBERRY) 100-50 MG CHEW Chew 2 tablets by mouth daily.   nitroGLYCERIN (NITROSTAT) 0.4 MG SL tablet Place 1 tablet (0.4 mg total) under the tongue every 5 (five) minutes x 3 doses as needed for chest pain.   Omega Fatty Acids-Vitamins (OMEGA-3 GUMMIES) CHEW Chew 2 tablets by mouth daily. VitaFusion   rosuvastatin (CRESTOR) 40 MG tablet TAKE ONE TABLET BY MOUTH EVERY DAY   No facility-administered encounter  medications on file as of 12/29/2023.    No Known Allergies  Review of Systems  Constitutional:  Negative for appetite change, chills, fatigue and fever.  HENT:  Negative for congestion, dental problem, ear pain and sore throat.   Eyes:  Negative for discharge, redness and visual disturbance.  Respiratory:  Negative for cough, chest tightness, shortness of breath and wheezing.   Cardiovascular:  Negative for chest pain, palpitations and leg swelling.  Gastrointestinal:  Negative for abdominal pain, blood in stool, diarrhea, nausea and vomiting.  Genitourinary:  Negative for difficulty urinating, dysuria, flank pain, frequency, hematuria and urgency.  Musculoskeletal:  Negative for arthralgias, back pain, joint swelling, myalgias and neck stiffness.  Skin:  Negative for pallor  and rash.  Neurological:  Negative for dizziness, speech difficulty, weakness and headaches.  Hematological:  Negative for adenopathy. Does not bruise/bleed easily.  Psychiatric/Behavioral:  Negative for confusion and sleep disturbance. The patient is not nervous/anxious.     PE; Blood pressure 108/74, pulse 80, height 6\' 2"  (1.88 m), weight 239 lb 3.2 oz (108.5 kg), SpO2 100%.  Physical Exam  Gen: Alert, well appearing.  Patient is oriented to person, place, time, and situation. AFFECT: pleasant, lucid thought and speech. ENT: Ears: EACs clear, normal epithelium.  TMs with good light reflex and landmarks bilaterally.  Eyes: no injection, icteris, swelling, or exudate.  EOMI, PERRLA. Nose: no drainage or turbinate edema/swelling.  No injection or focal lesion.  Mouth: lips without lesion/swelling.  Oral mucosa pink and moist.  Dentition intact and without obvious caries or gingival swelling.  Oropharynx without erythema, exudate, or swelling.  Neck: supple/nontender.  No LAD, mass, or TM.  Carotid pulses 2+ bilaterally, without bruits. CV: RRR, no m/r/g.   LUNGS: CTA bilat, nonlabored resps, good aeration in all  lung fields. ABD: soft, NT, ND, BS normal.  No hepatospenomegaly or mass.  No bruits. EXT: no clubbing, cyanosis, or edema.  Musculoskeletal: no joint swelling, erythema, warmth, or tenderness.  ROM of all joints intact. Skin - no sores or suspicious lesions or rashes or color changes  Pertinent labs:  Last CBC Lab Results  Component Value Date   WBC 4.1 11/17/2023   HGB 16.3 11/17/2023   HCT 47.6 11/17/2023   MCV 90.8 11/17/2023   MCH 31.1 11/17/2023   RDW 12.6 11/17/2023   PLT 183 11/17/2023   Last metabolic panel Lab Results  Component Value Date   GLUCOSE 65 (L) 12/08/2023   NA 142 12/08/2023   K 3.9 12/08/2023   CL 104 12/08/2023   CO2 24 12/08/2023   BUN 11 12/08/2023   CREATININE 1.03 12/08/2023   EGFR 91 12/08/2023   CALCIUM 9.4 12/08/2023   PROT 6.6 12/08/2023   ALBUMIN 4.6 12/08/2023   LABGLOB 2.0 12/08/2023   BILITOT 0.4 12/08/2023   ALKPHOS 59 12/08/2023   AST 35 12/08/2023   ALT 35 12/08/2023   ANIONGAP 7 11/17/2023   Last lipids Lab Results  Component Value Date   CHOL 110 12/08/2023   HDL 41 12/08/2023   LDLCALC 42 12/08/2023   TRIG 164 (H) 12/08/2023   CHOLHDL 2.7 12/08/2023   Last hemoglobin A1c Lab Results  Component Value Date   HGBA1C 5.4 12/08/2023   Last vitamin B12 and Folate Lab Results  Component Value Date   VITAMINB12 390 09/14/2020   Lab Results  Component Value Date   IRON 94 09/14/2020   TIBC 344 09/14/2020   FERRITIN 251 09/14/2020   ASSESSMENT AND PLAN:   New patient, establishing care.  #1 health maintenance exam: Reviewed age and gender appropriate health maintenance issues (prudent diet, regular exercise, health risks of tobacco and excessive alcohol, use of seatbelts, fire alarms in home, use of sunscreen).  Also reviewed age and gender appropriate health screening as well as vaccine recommendations. Vaccines: Prevnar 20 given today.  Tdap--> deferred.  Flu-->UTD.   Labs: PSA (cbc, cmet, and lipids all were  done in the last 1-51mo and were excellent). Prostate ca screening: PSA ordered Colon ca screening: Due for initial screening.  Options reviewed--> referred to GI for colonoscopy.  #2 metabolic dysfunction-associated steatohepatitis. Per patient request we will get him in with gastroenterology for follow-up. Recent hepatic panel normal.  #3  coronary artery disease, status post MI and RCA stenting October 2024. Preserved EF. Doing well in cardiac rehab. He will continue aspirin, Plavix, and Crestor. He has follow-up with his cardiologist in 1 week to review his outpatient rhythm monitoring that was done for further evaluation of palpitations.  #4 bipolar disorder and adult ADD. Stable on Strattera 80 mg a day, Lamictal 150 mg twice daily, and Caplyta 42mg  at bedtime.  Followed by psychiatrist.  An After Visit Summary was printed and given to the patient.  Return in about 1 year (around 12/28/2024) for annual CPE (fasting).  Signed:  Santiago Bumpers, MD           12/29/2023

## 2023-12-29 NOTE — Patient Instructions (Signed)
Health Maintenance, Male  Adopting a healthy lifestyle and getting preventive care are important in promoting health and wellness. Ask your health care provider about:  The right schedule for you to have regular tests and exams.  Things you can do on your own to prevent diseases and keep yourself healthy.  What should I know about diet, weight, and exercise?  Eat a healthy diet    Eat a diet that includes plenty of vegetables, fruits, low-fat dairy products, and lean protein.  Do not eat a lot of foods that are high in solid fats, added sugars, or sodium.  Maintain a healthy weight  Body mass index (BMI) is a measurement that can be used to identify possible weight problems. It estimates body fat based on height and weight. Your health care provider can help determine your BMI and help you achieve or maintain a healthy weight.  Get regular exercise  Get regular exercise. This is one of the most important things you can do for your health. Most adults should:  Exercise for at least 150 minutes each week. The exercise should increase your heart rate and make you sweat (moderate-intensity exercise).  Do strengthening exercises at least twice a week. This is in addition to the moderate-intensity exercise.  Spend less time sitting. Even light physical activity can be beneficial.  Watch cholesterol and blood lipids  Have your blood tested for lipids and cholesterol at 47 years of age, then have this test every 5 years.  You may need to have your cholesterol levels checked more often if:  Your lipid or cholesterol levels are high.  You are older than 47 years of age.  You are at high risk for heart disease.  What should I know about cancer screening?  Many types of cancers can be detected early and may often be prevented. Depending on your health history and family history, you may need to have cancer screening at various ages. This may include screening for:  Colorectal cancer.  Prostate cancer.  Skin cancer.  Lung  cancer.  What should I know about heart disease, diabetes, and high blood pressure?  Blood pressure and heart disease  High blood pressure causes heart disease and increases the risk of stroke. This is more likely to develop in people who have high blood pressure readings or are overweight.  Talk with your health care provider about your target blood pressure readings.  Have your blood pressure checked:  Every 3-5 years if you are 1-31 years of age.  Every year if you are 28 years old or older.  If you are between the ages of 29 and 4 and are a current or former smoker, ask your health care provider if you should have a one-time screening for abdominal aortic aneurysm (AAA).  Diabetes  Have regular diabetes screenings. This checks your fasting blood sugar level. Have the screening done:  Once every three years after age 40 if you are at a normal weight and have a low risk for diabetes.  More often and at a younger age if you are overweight or have a high risk for diabetes.  What should I know about preventing infection?  Hepatitis B  If you have a higher risk for hepatitis B, you should be screened for this virus. Talk with your health care provider to find out if you are at risk for hepatitis B infection.  Hepatitis C  Blood testing is recommended for:  Everyone born from [birth year] through 1964.  Anyone  with known risk factors for hepatitis C.  Sexually transmitted infections (STIs)  You should be screened each year for STIs, including gonorrhea and chlamydia, if:  You are sexually active and are younger than 47 years of age.  You are older than 47 years of age and your health care provider tells you that you are at risk for this type of infection.  Your sexual activity has changed since you were last screened, and you are at increased risk for chlamydia or gonorrhea. Ask your health care provider if you are at risk.  Ask your health care provider about whether you are at high risk for HIV. Your health care provider  may recommend a prescription medicine to help prevent HIV infection. If you choose to take medicine to prevent HIV, you should first get tested for HIV. You should then be tested every 3 months for as long as you are taking the medicine.  Follow these instructions at home:  Alcohol use  Do not drink alcohol if your health care provider tells you not to drink.  If you drink alcohol:  Limit how much you have to 0-2 drinks a day.  Know how much alcohol is in your drink. In the [place] one drink equals one 12 oz bottle of beer (355 mL), one 5 oz glass of wine (148 mL), or one 1 oz glass of hard liquor (44 mL).  Lifestyle  Do not use any products that contain nicotine or tobacco. These products include cigarettes, chewing tobacco, and vaping devices, such as e-cigarettes. If you need help quitting, ask your health care provider.  Do not use street drugs.  Do not share needles.  Ask your health care provider for help if you need support or information about quitting drugs.  General instructions  Schedule regular health, dental, and eye exams.  Stay current with your vaccines.  Tell your health care provider if:  You often feel depressed.  You have ever been abused or do not feel safe at home.  Summary  Adopting a healthy lifestyle and getting preventive care are important in promoting health and wellness.  Follow your health care provider's instructions about healthy diet, exercising, and getting tested or screened for diseases.  Follow your health care provider's instructions on monitoring your cholesterol and blood pressure.  This information is not intended to replace advice given to you by your health care provider. Make sure you discuss any questions you have with your health care provider.  Document Revised: 04/08/2021 Document Reviewed: 04/08/2021  Elsevier Patient Education  2024 [employer].

## 2023-12-30 ENCOUNTER — Ambulatory Visit (HOSPITAL_COMMUNITY): Payer: Medicaid Other

## 2023-12-30 ENCOUNTER — Encounter: Payer: Self-pay | Admitting: Family Medicine

## 2023-12-30 ENCOUNTER — Encounter (HOSPITAL_COMMUNITY)
Admission: RE | Admit: 2023-12-30 | Discharge: 2023-12-30 | Disposition: A | Discharge status: Home / Self Care | Payer: Medicaid Other | Source: Ambulatory Visit | Attending: Cardiovascular Disease | Admitting: Cardiovascular Disease

## 2024-01-01 ENCOUNTER — Ambulatory Visit (HOSPITAL_COMMUNITY): Payer: Medicaid Other

## 2024-01-01 ENCOUNTER — Encounter: Payer: Self-pay | Admitting: Cardiovascular Disease

## 2024-01-01 ENCOUNTER — Encounter (HOSPITAL_COMMUNITY): Payer: Medicaid Other

## 2024-01-01 ENCOUNTER — Telehealth (HOSPITAL_COMMUNITY): Payer: Self-pay | Admitting: *Deleted

## 2024-01-01 NOTE — Progress Notes (Signed)
Cardiac Individual Treatment Plan  Patient Details  Name: Billy Andersen MRN: [id number] Date of Birth: 15-May-1976 Referring Provider:   Flowsheet Row CARDIAC REHAB PHASE II ORIENTATION from 10/22/2023 in [facility]  Referring Provider Kristeen Miss, MD       Initial Encounter Date:  Flowsheet Row CARDIAC REHAB PHASE II ORIENTATION from 10/22/2023 in [facility] for Heart, Vascular, & Lung [facility]  Date 10/22/23       Visit Diagnosis: 09/13/23 NSTEMI (non-ST elevated myocardial infarction) (HCC)  09/13/23 DES RCA  Patient's Home Medications on Admission:  Current Outpatient Medications:    aspirin 81 MG chewable tablet, Chew 1 tablet (81 mg total) by mouth daily., Disp: 90 tablet, Rfl: 2   atomoxetine (STRATTERA) 80 MG capsule, Take 80 mg by mouth daily., Disp: , Rfl:    CAPLYTA 42 MG capsule, Take 42 mg by mouth at bedtime., Disp: , Rfl:    clopidogrel (PLAVIX) 75 MG tablet, Take 1 tablet (75 mg total) by mouth daily with breakfast., Disp: 90 tablet, Rfl: 2   lamoTRIgine (LAMICTAL) 200 MG tablet, Take 200 mg by mouth daily., Disp: , Rfl:    Misc Natural Products (AIRBORNE ELDERBERRY) 100-50 MG CHEW, Chew 2 tablets by mouth daily., Disp: , Rfl:    nitroGLYCERIN (NITROSTAT) 0.4 MG SL tablet, Place 1 tablet (0.4 mg total) under the tongue every 5 (five) minutes x 3 doses as needed for chest pain., Disp: 25 tablet, Rfl: 1   Omega Fatty Acids-Vitamins (OMEGA-3 GUMMIES) CHEW, Chew 2 tablets by mouth daily. VitaFusion, Disp: , Rfl:    rosuvastatin (CRESTOR) 40 MG tablet, TAKE ONE TABLET BY MOUTH EVERY DAY, Disp: 90 tablet, Rfl: 3  Past Medical History: Past Medical History:  Diagnosis Date   ADHD    Bipolar 1 disorder (HCC)    CAD (coronary artery disease)    Metabolic dysfunction-associated steatohepatitis (MASH)    2021 severe fibrosis (hepatologist)   NSTEMI (non-ST elevated myocardial  infarction) (HCC) 09/2023   DES RCA   Polycythemia    2021 hem/onc eval-->?spurius/?transient.  Iron normal   Ruptured plantar fascia    Right, 2022    Tobacco Use: Social History   Tobacco Use  Smoking Status Never  Smokeless Tobacco Never    Labs: Review Flowsheet       Latest Ref Rng & Units 09/13/2023 09/14/2023 12/08/2023  Labs for ITP Cardiac and Pulmonary Rehab  Cholestrol 100 - 199 mg/dL - 409  811   LDL (calc) 0 - 99 mg/dL - 79  42   HDL-C >91 mg/dL - 40  41   Trlycerides 0 - 149 mg/dL - 478  295   Hemoglobin A1c 4.8 - 5.6 % 5.1  - 5.4     Capillary Blood Glucose: No results found for: "GLUCAP"   Exercise Target Goals: Exercise Program Goal: Individual exercise prescription set using results from initial 6 min walk test and THRR while considering  patient's activity barriers and safety.   Exercise Prescription Goal: Initial exercise prescription builds to 30-45 minutes a day of aerobic activity, 2-3 days per week.  Home exercise guidelines will be given to patient during program as part of exercise prescription that the participant will acknowledge.  Activity Barriers & Risk Stratification:  Activity Barriers & Cardiac Risk Stratification - 10/22/23 1341       Activity Barriers & Cardiac Risk Stratification   Activity Barriers Shortness of Breath  Cardiac Risk Stratification High   <5 METs on            6 Minute Walk:  6 Minute Walk     Row Name 10/22/23 1340         6 Minute Walk   Phase Initial     Distance 1625 feet     Walk Time 6 minutes     # of Rest Breaks 0     MPH 3.08     METS 4.36     RPE 11     Perceived Dyspnea  1.5     VO2 Peak 15.26     Symptoms Yes (comment)     Comments SOB- resolved with rest. no pain     Resting HR 66 bpm     Resting BP 112/78     Resting Oxygen Saturation  97 %     Exercise Oxygen Saturation  during 6 min walk 100 %     Max Ex. HR 93 bpm     Max Ex. BP 122/82     2 Minute Post BP 114/76               Oxygen Initial Assessment:   Oxygen Re-Evaluation:   Oxygen Discharge (Final Oxygen Re-Evaluation):   Initial Exercise Prescription:  Initial Exercise Prescription - 10/22/23 1300       Date of Initial Exercise RX and Referring Provider   Date 10/22/23    Referring Provider Kristeen Miss, MD    Expected Discharge Date 01/13/24      Elliptical   Level 1    Speed 1    Minutes 15    METs 3.5      Rower   Level 1    Watts 45    Minutes 15    METs 3.5      Prescription Details   Frequency (times per week) 3    Duration Progress to 30 minutes of continuous aerobic without signs/symptoms of physical distress      Intensity   THRR 40-80% of Max Heartrate 70-139    Ratings of Perceived Exertion 11-13    Perceived Dyspnea 0-4      Progression   Progression Continue progressive overload as per policy without signs/symptoms or physical distress.      Resistance Training   Training Prescription Yes    Weight 5    Reps 10-15             Perform Capillary Blood Glucose checks as needed.  Exercise Prescription Changes:   Exercise Prescription Changes     Row Name 10/26/23 1400 11/06/23 1500 12/09/23 1500         Response to Exercise   Blood Pressure (Admit) 124/82 112/72 108/72     Blood Pressure (Exercise) 128/82 120/80 132/66     Blood Pressure (Exit) 114/72 114/78 114/58     Heart Rate (Admit) 79 bpm 90 bpm 76 bpm     Heart Rate (Exercise) 144 bpm 113 bpm 142 bpm     Heart Rate (Exit) 89 bpm 75 bpm 87 bpm     Rating of Perceived Exertion (Exercise) 10 13 12      Symptoms None None None     Comments Pt's first day in the CRP2 program Reviewed METs Reviewed METs and goals     Duration Continue with 30 min of aerobic exercise without signs/symptoms of physical distress. Continue with 30 min of aerobic exercise without signs/symptoms of physical distress. Continue  with 30 min of aerobic exercise without signs/symptoms of physical distress.      Intensity THRR unchanged THRR unchanged THRR unchanged       Progression   Progression Continue to progress workloads to maintain intensity without signs/symptoms of physical distress. Continue to progress workloads to maintain intensity without signs/symptoms of physical distress. Continue to progress workloads to maintain intensity without signs/symptoms of physical distress.     Average METs 4.5 4.75 5.35       Resistance Training   Training Prescription Yes Yes No     Weight 5 5 No weights on wednesdays     Reps 10-15 10-15 --     Time 10 Minutes 10 Minutes --       Interval Training   Interval Training No No No       Elliptical   Level 1 1 2      Speed 1 1 2      Minutes 15 15 15      METs 4.6 4.5 5.5       Rower   Level 1 6 6      Watts 29 56 53     Minutes 15 15 15      METs 4.46 4.96 5.16              Exercise Comments:   Exercise Comments     Row Name 10/26/23 1426 11/06/23 1555 11/20/23 1500 12/09/23 1526 12/23/23 1500   Exercise Comments Pt's first day in the CRP2 program. Pt exercised without complaints. Reviewed METs with patient today. Pt is making good progress. Pt due for MET and goal review on 11/23/23.  Pt was seen in ER and not cleared to return. Will complete upon patient's return. Provided encouragement and support regrading pt expressing that he is back at baseline since absence. Will continue to monitor and progress. Pt due for MET review but was absent. Will review upon patient return.            Exercise Goals and Review:   Exercise Goals     Row Name 10/22/23 1029             Exercise Goals   Increase Physical Activity Yes       Intervention Provide advice, education, support and counseling about physical activity/exercise needs.;Develop an individualized exercise prescription for aerobic and resistive training based on initial evaluation findings, risk stratification, comorbidities and participant's personal goals.       Expected  Outcomes Short Term: Attend rehab on a regular basis to increase amount of physical activity.;Long Term: Exercising regularly at least 3-5 days a week.;Long Term: Add in home exercise to make exercise part of routine and to increase amount of physical activity.       Increase Strength and Stamina Yes       Intervention Provide advice, education, support and counseling about physical activity/exercise needs.;Develop an individualized exercise prescription for aerobic and resistive training based on initial evaluation findings, risk stratification, comorbidities and participant's personal goals.       Expected Outcomes Short Term: Increase workloads from initial exercise prescription for resistance, speed, and METs.;Long Term: Improve cardiorespiratory fitness, muscular endurance and strength as measured by increased METs and functional capacity ( );Short Term: Perform resistance training exercises routinely during rehab and add in resistance training at home       Able to understand and use rate of perceived exertion (RPE) scale Yes       Intervention Provide education and explanation on how to use  RPE scale       Expected Outcomes Short Term: Able to use RPE daily in rehab to express subjective intensity level;Long Term:  Able to use RPE to guide intensity level when exercising independently       Knowledge and understanding of Target Heart Rate Range (THRR) Yes       Intervention Provide education and explanation of THRR including how the numbers were predicted and where they are located for reference       Expected Outcomes Short Term: Able to state/look up THRR;Short Term: Able to use daily as guideline for intensity in rehab;Long Term: Able to use THRR to govern intensity when exercising independently       Understanding of Exercise Prescription Yes       Intervention Provide education, explanation, and written materials on patient's individual exercise prescription       Expected Outcomes Short  Term: Able to explain program exercise prescription;Long Term: Able to explain home exercise prescription to exercise independently                Exercise Goals Re-Evaluation :  Exercise Goals Re-Evaluation     Row Name 10/26/23 1424 12/09/23 1523           Exercise Goal Re-Evaluation   Exercise Goals Review Increase Physical Activity;Understanding of Exercise Prescription;Increase Strength and Stamina;Knowledge and understanding of Target Heart Rate Range (THRR);Able to understand and use rate of perceived exertion (RPE) scale Increase Physical Activity;Understanding of Exercise Prescription;Increase Strength and Stamina;Knowledge and understanding of Target Heart Rate Range (THRR);Able to understand and use rate of perceived exertion (RPE) scale      Comments Pt's first day in the CRP2 program. Pt understands the exercise Rx, RPE scale and THRR. Reviewed METs and goals. Pt's short term goal is improved strength and stamina. Pt with recent visit to ER and his CRP2 has been on hold. Pt cleared yesterday to return. Pt voices he had felt like his strength and stamina had improved but feels like he has lost that while on medical hold      Expected Outcomes Will continue to monitor the patient and progress exericse workloads as tolerated. Will continue to monitor the patient and progress exericse workloads as tolerated.               Discharge Exercise Prescription (Final Exercise Prescription Changes):  Exercise Prescription Changes - 12/09/23 1500       Response to Exercise   Blood Pressure (Admit) 108/72    Blood Pressure (Exercise) 132/66    Blood Pressure (Exit) 114/58    Heart Rate (Admit) 76 bpm    Heart Rate (Exercise) 142 bpm    Heart Rate (Exit) 87 bpm    Rating of Perceived Exertion (Exercise) 12    Symptoms None    Comments Reviewed METs and goals    Duration Continue with 30 min of aerobic exercise without signs/symptoms of physical distress.    Intensity THRR  unchanged      Progression   Progression Continue to progress workloads to maintain intensity without signs/symptoms of physical distress.    Average METs 5.35      Resistance Training   Training Prescription No    Weight No weights on wednesdays      Interval Training   Interval Training No      Elliptical   Level 2    Speed 2    Minutes 15    METs 5.5      Rower  Level 6    Watts 53    Minutes 15    METs 5.16             Nutrition:  Target Goals: Understanding of nutrition guidelines, daily intake of sodium 1500mg , cholesterol 200mg , calories 30% from fat and 7% or less from saturated fats, daily to have 5 or more servings of fruits and vegetables.  Biometrics:  Pre Biometrics - 10/22/23 1115       Pre Biometrics   Waist Circumference 44.5 inches    Hip Circumference 43.5 inches    Waist to Hip Ratio 1.02 %    Triceps Skinfold 15 mm    % Body Fat 30.4 %    Grip Strength 56 kg    Flexibility 0 in   could not reach   Single Leg Stand 11.32 seconds              Nutrition Therapy Plan and Nutrition Goals:  Nutrition Therapy & Goals - 10/26/23 1336       Nutrition Therapy   Diet Heart Healthy Diet    Drug/Food Interactions Statins/Certain Fruits      Personal Nutrition Goals   Nutrition Goal Patient to identify strategies for reducing cardiovascular risk by attending the [employer] education and nutrition series weekly.    Personal Goal #2 Patient to improve diet quality by using the plate method as a guide for meal planning to include lean protein/plant protein, fruits, vegetables, whole grains, nonfat dairy as part of a well-balanced diet.    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 His LDL remains above goal of <16 (79, treated with crestor). Patient will benefit from participation in intensive cardiac rehab for nutrition, exercise, and lifestyle modification.      Intervention Plan   Intervention Prescribe, educate and counsel  regarding individualized specific dietary modifications aiming towards targeted core components such as weight, hypertension, lipid management, diabetes, heart failure and other comorbidities.;Nutrition handout(s) given to patient.    Expected Outcomes Short Term Goal: Understand basic principles of dietary content, such as calories, fat, sodium, cholesterol and nutrients.;Long Term Goal: Adherence to prescribed nutrition plan.             Nutrition Assessments:  MEDIFICTS Score Key: >=70 Need to make dietary changes  40-70 Heart Healthy Diet <= 40 Therapeutic Level Cholesterol Diet   Flowsheet Row CARDIAC REHAB PHASE II EXERCISE from 11/11/2023 in [facility], V[facility], [facility]  Picture Your Plate Total Score on Admission 75      Picture Your Plate Scores: <10 Unhealthy dietary pattern with much room for improvement. 41-50 Dietary pattern unlikely to meet recommendations for good health and room for improvement. 51-60 More healthful dietary pattern, with some room for improvement.  >60 Healthy dietary pattern, although there may be some specific behaviors that could be improved.    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 His LDL remains above goal of <96 (79, treated with crestor). Patient will benefit from participation in intensive cardiac rehab for nutrition, exercise, and lifestyle modification.  Nutrition Goals Re-Evaluation:  Nutrition Goals Re-Evaluation     Row Name 10/26/23 1336             Goals   Current Weight 243 lb 2.7 oz (110.3 kg)       Comment LDL 79, HDL 40, lipoprotein A WNL, A1c WNL       Expected Outcome Billy Andersen has medical history of NASH, CAD,  NSTEMI. His LDL remains above goal of <24 (79, treated with crestor). Patient will benefit from participation in intensive cardiac rehab for nutrition, exercise, and lifestyle modification.                Nutrition Goals Discharge (Final Nutrition Goals Re-Evaluation):  Nutrition Goals Re-Evaluation - 10/26/23 1336       Goals   Current Weight 243 lb 2.7 oz (110.3 kg)    Comment LDL 79, HDL 40, lipoprotein A WNL, A1c WNL    Expected Outcome Billy Andersen has medical history of NASH, CAD, NSTEMI. His LDL remains above goal of <40 (79, treated with crestor). Patient will benefit from participation in intensive cardiac rehab for nutrition, exercise, and lifestyle modification.             Psychosocial: Target Goals: Acknowledge presence or absence of significant depression and/or stress, maximize coping skills, provide positive support system. Participant is able to verbalize types and ability to use techniques and skills needed for reducing stress and depression.  Initial Review & Psychosocial Screening:  Initial Psych Review & Screening - 10/22/23 1117       Initial Review   Current issues with Current Depression;Current Stress Concerns    Source of Stress Concerns Chronic Illness;Unable to participate in former interests or hobbies    Comments Billy Andersen shared that he has had some feelings of depression/stress since his MI due to being unable to participate in former activites/hobbies. He says the changes since his MI have been tough to manage, especially some of the medication adjustments like coming off Strattera. Billy Andersen is already establishde with a psychologist and psychiatrist, and denies any need for further resources at this time.      Family Dynamics   Good Support System? Yes   Billy Andersen has his wife for support     Barriers   Psychosocial barriers to participate in program The patient should benefit from training in stress management and relaxation.      Screening  Interventions   Interventions Encouraged to exercise;Provide feedback about the scores to participant;To provide support and resources with identified psychosocial needs    Expected Outcomes Short Term goal: Utilizing psychosocial counselor, staff and physician to assist with identification of specific Stressors or current issues interfering with healing process. Setting desired goal for each stressor or current issue identified.;Long Term Goal: Stressors or current issues are controlled or eliminated.;Short Term goal: Identification and review with participant of any Quality of Life or Depression concerns found by scoring the questionnaire.;Long Term goal: The participant improves quality of Life and PHQ9 Scores as seen by post scores and/or verbalization of changes             Quality of Life Scores:  Quality of Life - 10/22/23 1353       Quality of Life   Select Quality of Life      Quality of Life Scores   Health/Function Pre 12.2 %    Socioeconomic Pre 18 %    Psych/Spiritual Pre 13.93 %    Family Pre 19.2 %    GLOBAL Pre  14.78 %            Scores of 19 and below usually indicate a poorer quality of life in these areas.  A difference of  2-3 points is a clinically meaningful difference.  A difference of 2-3 points in the total score of the Quality of Life Index has been associated with significant improvement in overall quality of life, self-image, physical symptoms, and general health in studies assessing change in quality of life.  PHQ-9: Review Flowsheet       12/29/2023 10/22/2023  Depression screen PHQ 2/9  Decreased Interest 1 1  Down, Depressed, Hopeless 0 1  PHQ - 2 Score 1 2  Altered sleeping 1 1  Tired, decreased energy 2 1  Change in appetite 1 1  Feeling bad or failure about yourself  1 1  Trouble concentrating 0 1  Moving slowly or fidgety/restless 0 1  Suicidal thoughts 0 0  PHQ-9 Score 6 8  Difficult doing work/chores Somewhat difficult Somewhat  difficult   Interpretation of Total Score  Total Score Depression Severity:  1-4 = Minimal depression, 5-9 = Mild depression, 10-14 = Moderate depression, 15-19 = Moderately severe depression, 20-27 = Severe depression   Psychosocial Evaluation and Intervention:   Psychosocial Re-Evaluation:  Psychosocial Re-Evaluation     Row Name 10/26/23 1411 11/11/23 1658 12/08/23 0917 01/01/24 1038       Psychosocial Re-Evaluation   Current issues with Current Depression;Current Stress Concerns Current Depression;Current Stress Concerns Current Depression;Current Stress Concerns Current Depression;Current Stress Concerns    Comments No concerns or stressors voiced on first day of exercise. Quality of life and PHq2-9 reviewed on 11/09/23. Pt admits to feeling depressed/down post his cardiac event. Pt stated he has a hx of bipolar takes mood stabilizers and sees a therapist regularly. RN encouraged to reach out to staff if he needs additional resources or a referral. Unablle to reassess. Exercise is currently on hold. Billy Andersen has not voiced any increased concerns or stressors since his return to exercise at cardiac rehab. last day of attendance was on 12/21/23.    Expected Outcomes Billy Andersen will hyave controlled or decreased stress/depression upon completion of cardiac rehab Billy Andersen will hyave controlled or decreased stress/depression upon completion of cardiac rehab Billy Andersen will hyave controlled or decreased stress/depression upon completion of cardiac rehab Billy Andersen will hyave controlled or decreased stress/depression upon completion of cardiac rehab    Interventions Stress management education;Relaxation education;Encouraged to attend Cardiac Rehabilitation for the exercise Stress management education;Relaxation education;Encouraged to attend Cardiac Rehabilitation for the exercise Stress management education;Relaxation education;Encouraged to attend Cardiac Rehabilitation for the exercise Stress management  education;Relaxation education;Encouraged to attend Cardiac Rehabilitation for the exercise    Continue Psychosocial Services  Follow up required by staff Follow up required by staff Follow up required by staff Follow up required by staff      Initial Review   Source of Stress Concerns Chronic Illness Chronic Illness Chronic Illness Chronic Illness    Comments Will continue to montior and offer support as needed. Will continue to montior and offer support as needed. Will continue to montior and offer support as needed. Will continue to montior and offer support as needed.             Psychosocial Discharge (Final Psychosocial Re-Evaluation):  Psychosocial Re-Evaluation - 01/01/24 1038       Psychosocial Re-Evaluation   Current issues with Current Depression;Current Stress Concerns    Comments Billy Andersen has not voiced any increased concerns or stressors  since his return to exercise at cardiac rehab. last day of attendance was on 12/21/23.    Expected Outcomes Billy Andersen will hyave controlled or decreased stress/depression upon completion of cardiac rehab    Interventions Stress management education;Relaxation education;Encouraged to attend Cardiac Rehabilitation for the exercise    Continue Psychosocial Services  Follow up required by staff      Initial Review   Source of Stress Concerns Chronic Illness    Comments Will continue to montior and offer support as needed.             Vocational Rehabilitation: Provide vocational rehab assistance to qualifying candidates.   Vocational Rehab Evaluation & Intervention:  Vocational Rehab - 10/22/23 1120       Initial Vocational Rehab Evaluation & Intervention   Assessment shows need for Vocational Rehabilitation No   Billy Andersen is working            Education: Education Goals: Education classes will be provided on a weekly basis, covering required topics. Participant will state understanding/return demonstration of topics  presented.    Education     Row Name 10/26/23 1300     Education   Cardiac Education Topics Pritikin   Nurse, children's Exercise Physiologist   Select Psychosocial   Psychosocial Healthy Minds, Bodies, Hearts   Instruction Review Code 1- Verbalizes Understanding   Class Start Time 1357   Class Stop Time 1429   Class Time Calculation (min) 32 min    Row Name 10/28/23 1400     Education   Cardiac Education Topics Pritikin   Nurse, children's Nurse   Select Nutrition   Nutrition Becoming a Pritikin Chef   Instruction Review Code 1- Verbalizes Understanding   Class Start Time 1356    Row Name 11/02/23 1400     Education   Cardiac Education Topics Pritikin   Nurse, children's Exercise Physiologist   Select Nutrition   Nutrition Other   Instruction Review Code 1- Verbalizes Understanding   Class Start Time 1356   Class Stop Time 1430   Class Time Calculation (min) 34 min    Row Name 11/06/23 1400     Education   Cardiac Education Topics Pritikin   Glass blower/designer Nutrition   Nutrition Workshop Label Reading   Instruction Review Code 1- Verbalizes Understanding   Class Start Time 1401   Class Stop Time 1433   Class Time Calculation (min) 32 min    Row Name 11/09/23 1600     Education   Cardiac Education Topics Pritikin   [employer];Workshops     Garment/textile technologist Psychosocial   Psychosocial Workshop Recognizing and Reducing Stress   Instruction Review Code 1- Verbalizes Understanding   Class Start Time 1359   Class Stop Time 1445   Class Time Calculation (min) 46 min    Row Name 11/11/23 1500     Education   Cardiac Education Topics Pritikin   Customer service manager   Weekly Topic Efficiency Cooking - Meals in a Snap    Instruction Review Code 1- Verbalizes Understanding   Class Start Time 1358   Class Stop Time 1438   Class Time Calculation (  min) 40 min    Row Name 11/13/23 1500     Education   Cardiac Education Topics Pritikin   Select Core Videos     Core Videos   Educator Dietitian   Select Nutrition   Nutrition Calorie Density   Instruction Review Code 1- Verbalizes Understanding   Class Start Time 1406   Class Stop Time 1501   Class Time Calculation (min) 55 min    Row Name 11/16/23 1600     Education   Cardiac Education Topics Pritikin   Geographical information systems officer Exercise   Instruction Review Code 1- Verbalizes Understanding   Class Start Time 1400   Class Stop Time 1450   Class Time Calculation (min) 50 min            Core Videos: Exercise    Move It!  Clinical staff conducted group or individual video education with verbal and written material and guidebook.  Patient learns the recommended Pritikin exercise program. Exercise with the goal of living a long, healthy life. Some of the health benefits of exercise include controlled diabetes, healthier blood pressure levels, improved cholesterol levels, improved heart and lung capacity, improved sleep, and better body composition. Everyone should speak with their doctor before starting or changing an exercise routine.  Biomechanical Limitations Clinical staff conducted group or individual video education with verbal and written material and guidebook.  Patient learns how biomechanical limitations can impact exercise and how we can mitigate and possibly overcome limitations to have an impactful and balanced exercise routine.  Body Composition Clinical staff conducted group or individual video education with verbal and written material and guidebook.  Patient learns that body composition (ratio of muscle mass to fat mass) is a key component to assessing overall fitness, rather than  body weight alone. Increased fat mass, especially visceral belly fat, can put [place] at increased risk for metabolic syndrome, type 2 diabetes, heart disease, and even death. It is recommended to combine diet and exercise (cardiovascular and resistance training) to improve your body composition. Seek guidance from your physician and exercise physiologist before implementing an exercise routine.  Exercise Action Plan Clinical staff conducted group or individual video education with verbal and written material and guidebook.  Patient learns the recommended strategies to achieve and enjoy long-term exercise adherence, including variety, self-motivation, self-efficacy, and positive decision making. Benefits of exercise include fitness, good health, weight management, more energy, better sleep, less stress, and overall well-being.  Medical   Heart Disease Risk Reduction Clinical staff conducted group or individual video education with verbal and written material and guidebook.  Patient learns our heart is our most vital organ as it circulates oxygen, nutrients, white blood cells, and hormones throughout the entire body, and carries waste away. Data supports a plant-based eating plan like the Pritikin Program for its effectiveness in slowing progression of and reversing heart disease. The video provides a number of recommendations to address heart disease.   Metabolic Syndrome and Belly Fat  Clinical staff conducted group or individual video education with verbal and written material and guidebook.  Patient learns what metabolic syndrome is, how it leads to heart disease, and how one can reverse it and keep it from coming back. You have metabolic syndrome if you have 3 of the following 5 criteria: abdominal obesity, high blood pressure, high triglycerides, low HDL cholesterol, and high blood sugar.  Hypertension and Heart Disease Clinical staff conducted group or  individual video education with verbal and  written material and guidebook.  Patient learns that high blood pressure, or hypertension, is very common in the [place]. Hypertension is largely due to excessive salt intake, but other important risk factors include being overweight, physical inactivity, drinking too much alcohol, smoking, and not eating enough potassium from fruits and vegetables. High blood pressure is a leading risk factor for heart attack, stroke, congestive heart failure, dementia, kidney failure, and premature death. Long-term effects of excessive salt intake include stiffening of the arteries and thickening of heart muscle and organ damage. Recommendations include ways to reduce hypertension and the risk of heart disease.  Diseases of Our Time - Focusing on Diabetes Clinical staff conducted group or individual video education with verbal and written material and guidebook.  Patient learns why the best way to stop diseases of our time is prevention, through food and other lifestyle changes. Medicine (such as prescription pills and surgeries) is often only a Band-Aid on the problem, not a long-term solution. Most common diseases of our time include obesity, type 2 diabetes, hypertension, heart disease, and cancer. The Pritikin Program is recommended and has been proven to help reduce, reverse, and/or prevent the damaging effects of metabolic syndrome.  Nutrition   Overview of the Pritikin Eating Plan  Clinical staff conducted group or individual video education with verbal and written material and guidebook.  Patient learns about the Pritikin Eating Plan for disease risk reduction. The Pritikin Eating Plan emphasizes a wide variety of unrefined, minimally-processed carbohydrates, like fruits, vegetables, whole grains, and legumes. Go, Caution, and Stop food choices are explained. Plant-based and lean animal proteins are emphasized. Rationale provided for low sodium intake for blood pressure control, low added sugars for blood  sugar stabilization, and low added fats and oils for coronary artery disease risk reduction and weight management.  Calorie Density  Clinical staff conducted group or individual video education with verbal and written material and guidebook.  Patient learns about calorie density and how it impacts the Pritikin Eating Plan. Knowing the characteristics of the food you choose will help you decide whether those foods will lead to weight gain or weight loss, and whether you want to consume more or less of them. Weight loss is usually a side effect of the Pritikin Eating Plan because of its focus on low calorie-dense foods.  Label Reading  Clinical staff conducted group or individual video education with verbal and written material and guidebook.  Patient learns about the Pritikin recommended label reading guidelines and corresponding recommendations regarding calorie density, added sugars, sodium content, and whole grains.  Dining Out - Part 1  Clinical staff conducted group or individual video education with verbal and written material and guidebook.  Patient learns that restaurant meals can be sabotaging because they can be so high in calories, fat, sodium, and/or sugar. Patient learns recommended strategies on how to positively address this and avoid unhealthy pitfalls.  Facts on Fats  Clinical staff conducted group or individual video education with verbal and written material and guidebook.  Patient learns that lifestyle modifications can be just as effective, if not more so, as many medications for lowering your risk of heart disease. A Pritikin lifestyle can help to reduce your risk of inflammation and atherosclerosis (cholesterol build-up, or plaque, in the artery walls). Lifestyle interventions such as dietary choices and physical activity address the cause of atherosclerosis. A review of the types of fats and their impact on blood cholesterol levels, along with dietary  recommendations to reduce  fat intake is also included.  Nutrition Action Plan  Clinical staff conducted group or individual video education with verbal and written material and guidebook.  Patient learns how to incorporate Pritikin recommendations into their lifestyle. Recommendations include planning and keeping personal health goals in mind as an important part of their success.  Healthy Mind-Set    Healthy Minds, Bodies, Hearts  Clinical staff conducted group or individual video education with verbal and written material and guidebook.  Patient learns how to identify when they are stressed. Video will discuss the impact of that stress, as well as the many benefits of stress management. Patient will also be introduced to stress management techniques. The way we think, act, and feel has an impact on our hearts.  How Our Thoughts Can Heal Our Hearts  Clinical staff conducted group or individual video education with verbal and written material and guidebook.  Patient learns that negative thoughts can cause depression and anxiety. This can result in negative lifestyle behavior and serious health problems. Cognitive behavioral therapy is an effective method to help control our thoughts in order to change and improve our emotional outlook.  Additional Videos:  Exercise    Improving Performance  Clinical staff conducted group or individual video education with verbal and written material and guidebook.  Patient learns to use a non-linear approach by alternating intensity levels and lengths of time spent exercising to help burn more calories and lose more body fat. Cardiovascular exercise helps improve heart health, metabolism, hormonal balance, blood sugar control, and recovery from fatigue. Resistance training improves strength, endurance, balance, coordination, reaction time, metabolism, and muscle mass. Flexibility exercise improves circulation, posture, and balance. Seek guidance from your physician and exercise  physiologist before implementing an exercise routine and learn your capabilities and proper form for all exercise.  Introduction to Yoga  Clinical staff conducted group or individual video education with verbal and written material and guidebook.  Patient learns about yoga, a discipline of the coming together of mind, breath, and body. The benefits of yoga include improved flexibility, improved range of motion, better posture and core strength, increased lung function, weight loss, and positive self-image. Yoga's heart health benefits include lowered blood pressure, healthier heart rate, decreased cholesterol and triglyceride levels, improved immune function, and reduced stress. Seek guidance from your physician and exercise physiologist before implementing an exercise routine and learn your capabilities and proper form for all exercise.  Medical   Aging: Enhancing Your Quality of Life  Clinical staff conducted group or individual video education with verbal and written material and guidebook.  Patient learns key strategies and recommendations to stay in good physical health and enhance quality of life, such as prevention strategies, having an advocate, securing a Health Care Proxy and Power of Attorney, and keeping a list of medications and system for tracking them. It also discusses how to avoid risk for bone loss.  Biology of Weight Control  Clinical staff conducted group or individual video education with verbal and written material and guidebook.  Patient learns that weight gain occurs because we consume more calories than we burn (eating more, moving less). Even if your body weight is normal, you may have higher ratios of fat compared to muscle mass. Too much body fat puts you at increased risk for cardiovascular disease, heart attack, stroke, type 2 diabetes, and obesity-related cancers. In addition to exercise, following the Pritikin Eating Plan can help reduce your risk.  Decoding Lab Results   Clinical staff  conducted group or individual video education with verbal and written material and guidebook.  Patient learns that lab test reflects one measurement whose values change over time and are influenced by many factors, including medication, stress, sleep, exercise, food, hydration, pre-existing medical conditions, and more. It is recommended to use the knowledge from this video to become more involved with your lab results and evaluate your numbers to speak with your doctor.   Diseases of Our Time - Overview  Clinical staff conducted group or individual video education with verbal and written material and guidebook.  Patient learns that according to the CDC, 50% to 70% of chronic diseases (such as obesity, type 2 diabetes, elevated lipids, hypertension, and heart disease) are avoidable through lifestyle improvements including healthier food choices, listening to satiety cues, and increased physical activity.  Sleep Disorders Clinical staff conducted group or individual video education with verbal and written material and guidebook.  Patient learns how good quality and duration of sleep are important to overall health and well-being. Patient also learns about sleep disorders and how they impact health along with recommendations to address them, including discussing with a physician.  Nutrition  Dining Out - Part 2 Clinical staff conducted group or individual video education with verbal and written material and guidebook.  Patient learns how to plan ahead and communicate in order to maximize their dining experience in a healthy and nutritious manner. Included are recommended food choices based on the type of restaurant the patient is visiting.   Fueling a Banker conducted group or individual video education with verbal and written material and guidebook.  There is a strong connection between our food choices and our health. Diseases like obesity and type 2 diabetes  are very prevalent and are in large-part due to lifestyle choices. The Pritikin Eating Plan provides plenty of food and hunger-curbing satisfaction. It is easy to follow, affordable, and helps reduce health risks.  Menu Workshop  Clinical staff conducted group or individual video education with verbal and written material and guidebook.  Patient learns that restaurant meals can sabotage health goals because they are often packed with calories, fat, sodium, and sugar. Recommendations include strategies to plan ahead and to communicate with the manager, chef, or server to help order a healthier meal.  Planning Your Eating Strategy  Clinical staff conducted group or individual video education with verbal and written material and guidebook.  Patient learns about the Pritikin Eating Plan and its benefit of reducing the risk of disease. The Pritikin Eating Plan does not focus on calories. Instead, it emphasizes high-quality, nutrient-rich foods. By knowing the characteristics of the foods, we choose, we can determine their calorie density and make informed decisions.  Targeting Your Nutrition Priorities  Clinical staff conducted group or individual video education with verbal and written material and guidebook.  Patient learns that lifestyle habits have a tremendous impact on disease risk and progression. This video provides eating and physical activity recommendations based on your personal health goals, such as reducing LDL cholesterol, losing weight, preventing or controlling type 2 diabetes, and reducing high blood pressure.  Vitamins and Minerals  Clinical staff conducted group or individual video education with verbal and written material and guidebook.  Patient learns different ways to obtain key vitamins and minerals, including through a recommended healthy diet. It is important to discuss all supplements you take with your doctor.   Healthy Mind-Set    Smoking Cessation  Clinical staff  conducted group or individual video  education with verbal and written material and guidebook.  Patient learns that cigarette smoking and tobacco addiction pose a serious health risk which affects millions of people. Stopping smoking will significantly reduce the risk of heart disease, lung disease, and many forms of cancer. Recommended strategies for quitting are covered, including working with your doctor to develop a successful plan.  Culinary   Becoming a Set designer conducted group or individual video education with verbal and written material and guidebook.  Patient learns that cooking at home can be healthy, cost-effective, quick, and puts them in control. Keys to cooking healthy recipes will include looking at your recipe, assessing your equipment needs, planning ahead, making it simple, choosing cost-effective seasonal ingredients, and limiting the use of added fats, salts, and sugars.  Cooking - Breakfast and Snacks  Clinical staff conducted group or individual video education with verbal and written material and guidebook.  Patient learns how important breakfast is to satiety and nutrition through the entire day. Recommendations include key foods to eat during breakfast to help stabilize blood sugar levels and to prevent overeating at meals later in the day. Planning ahead is also a key component.  Cooking - Educational psychologist conducted group or individual video education with verbal and written material and guidebook.  Patient learns eating strategies to improve overall health, including an approach to cook more at home. Recommendations include thinking of animal protein as a side on your plate rather than center stage and focusing instead on lower calorie dense options like vegetables, fruits, whole grains, and plant-based proteins, such as beans. Making sauces in large quantities to freeze for later and leaving the skin on your vegetables are also  recommended to maximize your experience.  Cooking - Healthy Salads and Dressing Clinical staff conducted group or individual video education with verbal and written material and guidebook.  Patient learns that vegetables, fruits, whole grains, and legumes are the foundations of the Pritikin Eating Plan. Recommendations include how to incorporate each of these in flavorful and healthy salads, and how to create homemade salad dressings. Proper handling of ingredients is also covered. Cooking - Soups and [employer] - Soups and Desserts Clinical staff conducted group or individual video education with verbal and written material and guidebook.  Patient learns that Pritikin soups and desserts make for easy, nutritious, and delicious snacks and meal components that are low in sodium, fat, sugar, and calorie density, while high in vitamins, minerals, and filling fiber. Recommendations include simple and healthy ideas for soups and desserts.   Overview     The Pritikin Solution Program Overview Clinical staff conducted group or individual video education with verbal and written material and guidebook.  Patient learns that the results of the Pritikin Program have been documented in more than 100 articles published in peer-reviewed journals, and the benefits include reducing risk factors for (and, in some cases, even reversing) high cholesterol, high blood pressure, type 2 diabetes, obesity, and more! An overview of the three key pillars of the Pritikin Program will be covered: eating well, doing regular exercise, and having a healthy mind-set.  WORKSHOPS  Exercise: Exercise Basics: Building Your Action Plan Clinical staff led group instruction and group discussion with PowerPoint presentation and patient guidebook. To enhance the learning environment the use of posters, models and videos may be added. At the conclusion of this workshop, patients will comprehend the difference between physical  activity and exercise, as well as the benefits of  incorporating both, into their routine. Patients will understand the FITT (Frequency, Intensity, Time, and Type) principle and how to use it to build an exercise action plan. In addition, safety concerns and other considerations for exercise and cardiac rehab will be addressed by the presenter. The purpose of this lesson is to promote a comprehensive and effective weekly exercise routine in order to improve patients' overall level of fitness.   Managing Heart Disease: Your Path to a Healthier Heart Clinical staff led group instruction and group discussion with PowerPoint presentation and patient guidebook. To enhance the learning environment the use of posters, models and videos may be added.At the conclusion of this workshop, patients will understand the anatomy and physiology of the heart. Additionally, they will understand how Pritikin's three pillars impact the risk factors, the progression, and the management of heart disease.  The purpose of this lesson is to provide a high-level overview of the heart, heart disease, and how the Pritikin lifestyle positively impacts risk factors.  Exercise Biomechanics Clinical staff led group instruction and group discussion with PowerPoint presentation and patient guidebook. To enhance the learning environment the use of posters, models and videos may be added. Patients will learn how the structural parts of their bodies function and how these functions impact their daily activities, movement, and exercise. Patients will learn how to promote a neutral spine, learn how to manage pain, and identify ways to improve their physical movement in order to promote healthy living. The purpose of this lesson is to expose patients to common physical limitations that impact physical activity. Participants will learn practical ways to adapt and manage aches and pains, and to minimize their effect on regular exercise.  Patients will learn how to maintain good posture while sitting, walking, and lifting.  Balance Training and Fall Prevention  Clinical staff led group instruction and group discussion with PowerPoint presentation and patient guidebook. To enhance the learning environment the use of posters, models and videos may be added. At the conclusion of this workshop, patients will understand the importance of their sensorimotor skills (vision, proprioception, and the vestibular system) in maintaining their ability to balance as they age. Patients will apply a variety of balancing exercises that are appropriate for their current level of function. Patients will understand the common causes for poor balance, possible solutions to these problems, and ways to modify their physical environment in order to minimize their fall risk. The purpose of this lesson is to teach patients about the importance of maintaining balance as they age and ways to minimize their risk of falling.  WORKSHOPS   Nutrition:  Fueling a Ship broker led group instruction and group discussion with PowerPoint presentation and patient guidebook. To enhance the learning environment the use of posters, models and videos may be added. Patients will review the foundational principles of the Pritikin Eating Plan and understand what constitutes a serving size in each of the food groups. Patients will also learn Pritikin-friendly foods that are better choices when away from home and review make-ahead meal and snack options. Calorie density will be reviewed and applied to three nutrition priorities: weight maintenance, weight loss, and weight gain. The purpose of this lesson is to reinforce (in a group setting) the key concepts around what patients are recommended to eat and how to apply these guidelines when away from home by planning and selecting Pritikin-friendly options. Patients will understand how calorie density may be adjusted for  different weight management goals.  Mindful Eating  Clinical  staff led group instruction and group discussion with PowerPoint presentation and patient guidebook. To enhance the learning environment the use of posters, models and videos may be added. Patients will briefly review the concepts of the Pritikin Eating Plan and the importance of low-calorie dense foods. The concept of mindful eating will be introduced as well as the importance of paying attention to internal hunger signals. Triggers for non-hunger eating and techniques for dealing with triggers will be explored. The purpose of this lesson is to provide patients with the opportunity to review the basic principles of the Pritikin Eating Plan, discuss the value of eating mindfully and how to measure internal cues of hunger and fullness using the Hunger Scale. Patients will also discuss reasons for non-hunger eating and learn strategies to use for controlling emotional eating.  Targeting Your Nutrition Priorities Clinical staff led group instruction and group discussion with PowerPoint presentation and patient guidebook. To enhance the learning environment the use of posters, models and videos may be added. Patients will learn how to determine their genetic susceptibility to disease by reviewing their family history. Patients will gain insight into the importance of diet as part of an overall healthy lifestyle in mitigating the impact of genetics and other environmental insults. The purpose of this lesson is to provide patients with the opportunity to assess their personal nutrition priorities by looking at their family history, their own health history and current risk factors. Patients will also be able to discuss ways of prioritizing and modifying the Pritikin Eating Plan for their highest risk areas  Menu  Clinical staff led group instruction and group discussion with PowerPoint presentation and patient guidebook. To enhance the learning  environment the use of posters, models and videos may be added. Using menus brought in from [employer], or printed from [employer], patients will apply the Pritikin dining out guidelines that were presented in the [employer] video. Patients will also be able to practice these guidelines in a variety of provided scenarios. The purpose of this lesson is to provide patients with the opportunity to practice hands-on learning of the Pritikin Dining Out guidelines with actual menus and practice scenarios.  Label Reading Clinical staff led group instruction and group discussion with PowerPoint presentation and patient guidebook. To enhance the learning environment the use of posters, models and videos may be added. Patients will review and discuss the Pritikin label reading guidelines presented in [employer]ritikin's Label Reading Educational series video. Using fool labels brought in from local grocery stores and markets, patients will apply the label reading guidelines and determine if the packaged food meet the Pritikin guidelines. The purpose of this lesson is to provide patients with the opportunity to review, discuss, and practice hands-on learning of the Pritikin Label Reading guidelines with actual packaged food labels. Cooking School  Pritikin's [employer] are designed to teach patients ways to prepare quick, simple, and affordable recipes at home. The importance of nutrition's role in chronic disease risk reduction is reflected in its emphasis in the overall Pritikin program. By learning how to prepare essential core Pritikin Eating Plan recipes, patients will increase control over what they eat; be able to customize the flavor of foods without the use of added salt, sugar, or fat; and improve the quality of the food they consume. By learning a set of core recipes which are easily assembled, quickly prepared, and affordable, patients are more likely to prepare more healthy  foods at home. These workshops focus on convenient  breakfasts, simple entres, side dishes, and desserts which can be prepared with minimal effort and are consistent with nutrition recommendations for cardiovascular risk reduction. Cooking [employer] are taught by a Armed forces logistics/support/administrative officer (RD) who has been trained by the [employer]. The chef or RD has a clear understanding of the importance of minimizing - if not completely eliminating - added fat, sugar, and sodium in recipes. Throughout the series of Cooking School Workshop sessions, patients will learn about healthy ingredients and efficient methods of cooking to build confidence in their capability to prepare    Cooking School weekly topics:  Adding Flavor- Sodium-Free  Fast and Healthy Breakfasts  Powerhouse Plant-Based Proteins  Satisfying Salads and Dressings  Simple Sides and Sauces  International Cuisine-Spotlight on the [employer] Zones  Delicious Desserts  Savory Soups  [employer] - Meals in a Astronomer Appetizers and Snacks  Comforting Weekend Breakfasts  One-Pot Wonders   Fast Evening Meals  Landscape architect Your Pritikin Plate  WORKSHOPS   Healthy Mindset (Psychosocial):  Focused Goals, Sustainable Changes Clinical staff led group instruction and group discussion with PowerPoint presentation and patient guidebook. To enhance the learning environment the use of posters, models and videos may be added. Patients will be able to apply effective goal setting strategies to establish at least one personal goal, and then take consistent, meaningful action toward that goal. They will learn to identify common barriers to achieving personal goals and develop strategies to overcome them. Patients will also gain an understanding of how our mind-set can impact our ability to achieve goals and the importance of cultivating a positive and growth-oriented mind-set. The purpose of this lesson is to  provide patients with a deeper understanding of how to set and achieve personal goals, as well as the tools and strategies needed to overcome common obstacles which may arise along the way.  From Head to Heart: The Power of a Healthy Outlook  Clinical staff led group instruction and group discussion with PowerPoint presentation and patient guidebook. To enhance the learning environment the use of posters, models and videos may be added. Patients will be able to recognize and describe the impact of emotions and mood on physical health. They will discover the importance of self-care and explore self-care practices which may work for them. Patients will also learn how to utilize the 4 C's to cultivate a healthier outlook and better manage stress and challenges. The purpose of this lesson is to demonstrate to patients how a healthy outlook is an essential part of maintaining good health, especially as they continue their cardiac rehab journey.  Healthy Sleep for a Healthy Heart Clinical staff led group instruction and group discussion with PowerPoint presentation and patient guidebook. To enhance the learning environment the use of posters, models and videos may be added. At the conclusion of this workshop, patients will be able to demonstrate knowledge of the importance of sleep to overall health, well-being, and quality of life. They will understand the symptoms of, and treatments for, common sleep disorders. Patients will also be able to identify daytime and nighttime behaviors which impact sleep, and they will be able to apply these tools to help manage sleep-related challenges. The purpose of this lesson is to provide patients with a general overview of sleep and outline the importance of quality sleep. Patients will learn about a few of the most common sleep disorders. Patients will also be introduced to the concept of "sleep hygiene," and discover  ways to self-manage certain sleeping problems through  simple daily behavior changes. Finally, the workshop will motivate patients by clarifying the links between quality sleep and their goals of heart-healthy living.   Recognizing and Reducing Stress Clinical staff led group instruction and group discussion with PowerPoint presentation and patient guidebook. To enhance the learning environment the use of posters, models and videos may be added. At the conclusion of this workshop, patients will be able to understand the types of stress reactions, differentiate between acute and chronic stress, and recognize the impact that chronic stress has on their health. They will also be able to apply different coping mechanisms, such as reframing negative self-talk. Patients will have the opportunity to practice a variety of stress management techniques, such as deep abdominal breathing, progressive muscle relaxation, and/or guided imagery.  The purpose of this lesson is to educate patients on the role of stress in their lives and to provide healthy techniques for coping with it.  Learning Barriers/Preferences:  Learning Barriers/Preferences - 10/22/23 1120       Learning Barriers/Preferences   Learning Barriers None    Learning Preferences Computer/Internet;Group Instruction;Individual Instruction;Skilled Demonstration;Verbal Instruction;Video             Education Topics:  Knowledge Questionnaire Score:  Knowledge Questionnaire Score - 10/22/23 1123       Knowledge Questionnaire Score   Pre Score 21/24             Core Components/Risk Factors/Patient Goals at Admission:  Personal Goals and Risk Factors at Admission - 10/22/23 1120       Core Components/Risk Factors/Patient Goals on Admission    Weight Management Yes;Obesity;Weight Loss    Intervention Weight Management: Develop a combined nutrition and exercise program designed to reach desired caloric intake, while maintaining appropriate intake of nutrient and fiber, sodium and fats, and  appropriate energy expenditure required for the weight goal.;Weight Management: Provide education and appropriate resources to help participant work on and attain dietary goals.;Weight Management/Obesity: Establish reasonable short term and long term weight goals.;Obesity: Provide education and appropriate resources to help participant work on and attain dietary goals.    Expected Outcomes Understanding of distribution of calorie intake throughout the day with the consumption of 4-5 meals/snacks;Understanding recommendations for meals to include 15-35% energy as protein, 25-35% energy from fat, 35-60% energy from carbohydrates, less than 200mg  of dietary cholesterol, 20-35 gm of total fiber daily;Weight Loss: Understanding of general recommendations for a balanced deficit meal plan, which promotes 1-2 lb weight loss per week and includes a negative energy balance of [phone] kcal/d;Long Term: Adherence to nutrition and physical activity/exercise program aimed toward attainment of established weight goal;Short Term: Continue to assess and modify interventions until short term weight is achieved    Lipids Yes    Intervention Provide education and support for participant on nutrition & aerobic/resistive exercise along with prescribed medications to achieve LDL 70mg , HDL >40mg .    Expected Outcomes Short Term: Participant states understanding of desired cholesterol values and is compliant with medications prescribed. Participant is following exercise prescription and nutrition guidelines.;Long Term: Cholesterol controlled with medications as prescribed, with individualized exercise RX and with personalized nutrition plan. Value goals: LDL < 70mg , HDL > 40 mg.    Stress Yes    Intervention Offer individual and/or small group education and counseling on adjustment to heart disease, stress management and health-related lifestyle change. Teach and support self-help strategies.;Refer participants experiencing  significant psychosocial distress to appropriate mental health specialists for further evaluation and  treatment. When possible, include family members and significant others in education/counseling sessions.    Expected Outcomes Short Term: Participant demonstrates changes in health-related behavior, relaxation and other stress management skills, ability to obtain effective social support, and compliance with psychotropic medications if prescribed.;Long Term: Emotional wellbeing is indicated by absence of clinically significant psychosocial distress or social isolation.             Core Components/Risk Factors/Patient Goals Review:   Goals and Risk Factor Review     Row Name 10/26/23 1413 11/11/23 1700 12/08/23 0917 01/01/24 1039       Core Components/Risk Factors/Patient Goals Review   Personal Goals Review Weight Management/Obesity;Lipids;Stress Weight Management/Obesity;Lipids;Stress Weight Management/Obesity;Lipids;Stress Weight Management/Obesity;Lipids;Stress    Review Billy Andersen started cardiac rehab on 10/26/23. Billy Andersen did well with exercise. Vital signs were stable. Billy Andersen has been doing  well with exercise. Vital signs have been  stable. Exercise is currently on hold due to ED visit on 11/17/23. Need MD clearance to return. Billy Andersen has been doing well with exercise at cardiac rehab since cleared to return by Dr Elease Hashimoto. last day of attendance was on 12/21/23. Vital signs have been stable.    Expected Outcomes Billy Andersen will conitnue to participate in cardiac rehab for exercise, nutrition and lifestyle modifications. Billy Andersen will conitnue to participate in cardiac rehab for exercise, nutrition and lifestyle modifications. Billy Andersen will conitnue to participate in cardiac rehab for exercise, nutrition and lifestyle modifications. Billy Andersen will conitnue to participate in cardiac rehab for exercise, nutrition and lifestyle modifications.             Core Components/Risk Factors/Patient Goals  at Discharge (Final Review):   Goals and Risk Factor Review - 01/01/24 1039       Core Components/Risk Factors/Patient Goals Review   Personal Goals Review Weight Management/Obesity;Lipids;Stress    Review Billy Andersen has been doing well with exercise at cardiac rehab since cleared to return by Dr Elease Hashimoto. last day of attendance was on 12/21/23. Vital signs have been stable.    Expected Outcomes Billy Andersen will conitnue to participate in cardiac rehab for exercise, nutrition and lifestyle modifications.             ITP Comments:  ITP Comments     Row Name 10/22/23 1029 10/26/23 1409 11/11/23 1657 12/08/23 0915 01/01/24 1036   ITP Comments Dr. Armanda Magic medical director. Introduction to pritikin education/intensive cardiac rehab. Initial orientation packet reviewed with patient. 30 Day ITP Review. Billy Andersen started cardiac rehab on 10/26/23. Billy Andersen did well with exercise. 30 Day ITP Review. Billy Andersen has good attendance and participation in cardiac rehab. 30 Day ITP Review. Exercise is currently on hold due to Ed visit on 11/17/23. 30 Day ITP Review. Kathlene November has returned to exercise at cardiac rehab per Dr Elease Hashimoto. Last day of attendance was on 12/21/23.            Comments: See ITP comments.Thayer Headings RN BSN

## 2024-01-01 NOTE — Telephone Encounter (Signed)
Left message to call cardiac rehab. Last day of attendance was on 12/21/23.Thayer Headings RN BSN

## 2024-01-03 ENCOUNTER — Encounter: Payer: Self-pay | Admitting: Cardiovascular Disease

## 2024-01-03 NOTE — Progress Notes (Signed)
 Cardiology Office Note:  .   Date:  01/05/2024  ID:  Billy Andersen, DOB July 24, 1976, MRN [id number] PCP: [name], MD  [facility] Providers Cardiologist:  Aleene Passe, MD    History of Present Illness: .   Billy Andersen is a 48 y.o. male who I met in Oct. 2024 when he presented to the hospital with CP and NSTEMI. Cath showed an occluded RCA which was successfully stented   Went back into the hospital on Dec. 17 Felt weird in his chest ,  sharp pan ,  Tingling in left arm ,  Tried NTG  ,  tingling did not resolve. Called the office,  Went to hospital   He has been having some neck pain  Encouraged him to have this looked into   Has had some palpitations  Weird heart flutter    Glucose levels are minimally elevated Has been eating LOTS of corn ( 1-2 steamer bags a day )  Encouraged him to back off the corn  Wants to go back on [employer]  - was taken off at the hospital   He Is ok to restart cardiac rehab.    I suspect he is deconditioned   Feb. 4, 2025: Has a history of non-ST segment elevation and mid October.  Heart catheterization on October 14 revealed an occluded right coronary artery which was stented.  He represented to the hospital a week later for worsening symptoms.  Repeat cath revealed widely patent right coronary artery.  He had no obstructive coronary artery disease otherwise.  Billy Andersen is seen for follow up of a recent Zio monitor  He had symptomatic episodes of SVT We started metoprolol  25  BID to help suppress these ( he hasn't started the metoprolol  yet  He is no longer eating 2 steamer bags of corn a day.  He is resorted to eating a full steamer bag of green beans.  Have advised him to eat everything in proportion  Is doing rehab.   He thinks that he is having more shortness of breath.  Will get an echocardiogram for repeat evaluation.  ROS:   Studies Reviewed: .         Risk Assessment/Calculations:              Physical Exam:   VS:  BP 120/82   Pulse 82   Ht 6' 2 (1.88 m)   Wt 240 lb 12.8 oz (109.2 kg)   SpO2 98%   BMI 30.92 kg/m    Wt Readings from Last 3 Encounters:  01/05/24 240 lb 12.8 oz (109.2 kg)  12/29/23 239 lb 3.2 oz (108.5 kg)  12/08/23 250 lb 9.6 oz (113.7 kg)    GEN: Well nourished, well developed in no acute distress NECK: No JVD; No carotid bruits CARDIAC: RRR, soft systolic murmur radiating to left axillary line   RESPIRATORY:  Clear to auscultation without rales, wheezing or rhonchi  ABDOMEN: Soft, non-tender, non-distended EXTREMITIES:  No edema; No deformity   ASSESSMENT AND PLAN: .       CAD : Status post stenting of his right coronary artery.  He seems to be doing well.  He did have some unusual chest twinges and presented back to the emergency room in mid December.  His workup at that time was negative. He seems to be anxious about his diagnosis of coronary artery disease.  He is having worsening shortness of breath.  Will repeat echocardiogram.  If he continues to have  exertional shortness of breath, we will consider doing a an exercise Myoview study.  2.  Possible obsessive-compulsive disorder: Billy Andersen was previously eating 2 steamer bags of corn each day.  I have advised him not to do that now he is eating a steamer bag of green beans.  This is easily for servings of green beans.  Of asked him to eat just irregular portions of food with each meal.  I think he will do better with that.  Of asked him to continue to exercise.    4.  Hyperlipidemia: Is on rosuvastatin  40.    LDL is 42 on Jan. 7, 2025  5.  ADHD:   When he was discharged from the hospital somebody told him to stop his Strattera .  In my opinion I think stopping Strattera  would be more harmful than continue it.  He is very stable.  If he develops any tachycardia or hypertension we will be able to control that with metoprolol  and other medications.         Dispo:  6 months with Dr. Pietro     Signed, Aleene Passe, MD

## 2024-01-04 ENCOUNTER — Ambulatory Visit (HOSPITAL_COMMUNITY): Payer: Medicaid Other

## 2024-01-04 ENCOUNTER — Encounter (HOSPITAL_COMMUNITY): Admission: RE | Admit: 2024-01-04 | Payer: Medicaid Other | Source: Ambulatory Visit

## 2024-01-04 ENCOUNTER — Telehealth: Payer: Self-pay

## 2024-01-04 MED ORDER — METOPROLOL TARTRATE 25 MG PO TABS: 25.0000 mg | ORAL_TABLET | Freq: Two times a day (BID) | ORAL | 3 refills | Status: DC

## 2024-01-04 NOTE — Telephone Encounter (Signed)
Spoke with pt. Relayed heart monitor results and Dr.'s suggestion to start Metoprolol 25 mg twice daily. Pt verbalized understanding. Prescription ordered and sent to pharmacy.

## 2024-01-04 NOTE — Telephone Encounter (Signed)
-----   Message from Kristeen Miss sent at 01/01/2024  6:14 PM EST ----- Predominant rhythm is sinus rhythm. He had several episodes of supraventricular tachycardia.  These episodes are very brief but they were associated with the triggered events so clear they are symptomatic.  Please start metoprolol 25 mg twice a day.

## 2024-01-05 ENCOUNTER — Ambulatory Visit: Payer: Medicaid Other | Attending: Cardiovascular Disease | Admitting: Cardiovascular Disease

## 2024-01-05 ENCOUNTER — Encounter: Payer: Self-pay | Admitting: Cardiovascular Disease

## 2024-01-05 VITALS — BP 120/82 | HR 82 | Ht 74.0 in | Wt 240.8 lb

## 2024-01-05 DIAGNOSIS — R0602 Shortness of breath: Secondary | ICD-10-CM | POA: Diagnosis not present

## 2024-01-05 NOTE — Patient Instructions (Signed)
Testing/Procedures: ECHO Your physician has requested that you have an echocardiogram. Echocardiography is a painless test that uses sound waves to create images of your heart. It provides your doctor with information about the size and shape of your heart and how well your heart's chambers and valves are working. This procedure takes approximately one hour. There are no restrictions for this procedure. Please do NOT wear cologne, perfume, aftershave, or lotions (deodorant is allowed). Please arrive 15 minutes prior to your appointment time.  Please note: We ask at that you not bring children with you during ultrasound (echo/ vascular) testing. Due to room size and safety concerns, children are not allowed in the ultrasound rooms during exams. Our front office staff cannot provide observation of children in our lobby area while testing is being conducted. An adult accompanying a patient to their appointment will only be allowed in the ultrasound room at the discretion of the ultrasound technician under special circumstances. We apologize for any inconvenience.   Follow-Up: At [facility], you and your health needs are our priority.  As part of our continuing mission to provide you with exceptional heart care, we have created designated Provider Care Teams.  These Care Teams include your primary Cardiologist (physician) and Advanced Practice Providers (APPs -  Physician Assistants and Nurse Practitioners) who all work together to provide you with the care you need, when you need it.  We recommend signing up for the patient portal called "MyChart".  Sign up information is provided on this After Visit Summary.  MyChart is used to connect with patients for Virtual Visits (Telemedicine).  Patients are able to view lab/test results, encounter notes, upcoming appointments, etc.  Non-urgent messages can be sent to your provider as well.   To learn more about what you can do with MyChart, go to  [email or web address].    Your next appointment:   4 month(s)  Provider:   APP  Other Instructions   1st Floor: - Lobby - Registration  - Pharmacy  - Lab - Cafe  2nd Floor: - PV Lab - Diagnostic Testing (echo, CT, nuclear med)  3rd Floor: - Vacant  4th Floor: - TCTS (cardiothoracic surgery) - AFib Clinic - Structural Heart Clinic - Vascular Surgery  - Vascular Ultrasound  5th Floor: - HeartCare Cardiology (general and EP) - Clinical Pharmacy for coumadin, hypertension, lipid, weight-loss medications, and med management appointments    Valet parking services will be available as well.

## 2024-01-06 ENCOUNTER — Ambulatory Visit (HOSPITAL_COMMUNITY): Payer: Medicaid Other

## 2024-01-06 ENCOUNTER — Encounter (HOSPITAL_COMMUNITY): Payer: Medicaid Other

## 2024-01-08 ENCOUNTER — Ambulatory Visit (HOSPITAL_COMMUNITY): Payer: Medicaid Other

## 2024-01-08 ENCOUNTER — Encounter (HOSPITAL_COMMUNITY): Payer: Medicaid Other

## 2024-01-11 ENCOUNTER — Ambulatory Visit (HOSPITAL_COMMUNITY): Payer: Medicaid Other

## 2024-01-11 ENCOUNTER — Encounter (HOSPITAL_COMMUNITY): Admission: RE | Admit: 2024-01-11 | Payer: Medicaid Other | Source: Ambulatory Visit

## 2024-01-12 ENCOUNTER — Telehealth (HOSPITAL_COMMUNITY): Payer: Self-pay

## 2024-01-13 ENCOUNTER — Ambulatory Visit (HOSPITAL_COMMUNITY): Payer: Medicaid Other

## 2024-01-13 ENCOUNTER — Encounter (HOSPITAL_COMMUNITY): Admission: RE | Admit: 2024-01-13 | Payer: Medicaid Other | Source: Ambulatory Visit

## 2024-01-15 ENCOUNTER — Ambulatory Visit (HOSPITAL_COMMUNITY): Payer: Medicaid Other

## 2024-01-18 ENCOUNTER — Ambulatory Visit (HOSPITAL_COMMUNITY): Payer: Medicaid Other

## 2024-01-19 ENCOUNTER — Telehealth (HOSPITAL_COMMUNITY): Payer: Self-pay | Admitting: *Deleted

## 2024-01-19 NOTE — Telephone Encounter (Signed)
 Left message to call cardiac rehab. Last day of attendance was on 12/21/23. Will discharge from cardiac rehab at the end of the week due to nonattendance if we don't hear back from Casimiro Needle. Left message that we will be closed due to inclement weather.Thayer Headings RN BSN

## 2024-01-20 ENCOUNTER — Ambulatory Visit (HOSPITAL_COMMUNITY): Payer: Medicaid Other

## 2024-01-21 ENCOUNTER — Ambulatory Visit (HOSPITAL_COMMUNITY): Payer: Medicaid Other

## 2024-01-22 ENCOUNTER — Ambulatory Visit (HOSPITAL_COMMUNITY): Payer: Medicaid Other

## 2024-01-22 ENCOUNTER — Encounter (HOSPITAL_COMMUNITY): Payer: Self-pay | Admitting: *Deleted

## 2024-01-22 DIAGNOSIS — Z955 Presence of coronary angioplasty implant and graft: Secondary | ICD-10-CM

## 2024-01-22 DIAGNOSIS — I214 Non-ST elevation (NSTEMI) myocardial infarction: Secondary | ICD-10-CM

## 2024-01-22 NOTE — Progress Notes (Signed)
 Discharge Progress Report  Patient Details  Name: Billy Andersen MRN: [id number] Date of Birth: 17-Oct-1976 Referring Provider:   Flowsheet Row CARDIAC REHAB PHASE II ORIENTATION from 10/22/2023 in [facility]  Referring Provider Kristeen Miss, MD        Number of Visits: 22  Reason for Discharge:  Early Exit:  Lack of attendance  Smoking History:  Social History   Tobacco Use  Smoking Status Never  Smokeless Tobacco Never    Diagnosis:  09/13/23 NSTEMI (non-ST elevated myocardial infarction) (HCC)  09/13/23 DES RCA  ADL UCSD:   Initial Exercise Prescription:   Discharge Exercise Prescription (Final Exercise Prescription Changes):  Exercise Prescription Changes - 12/09/23 1500       Response to Exercise   Blood Pressure (Admit) 108/72    Blood Pressure (Exercise) 132/66    Blood Pressure (Exit) 114/58    Heart Rate (Admit) 76 bpm    Heart Rate (Exercise) 142 bpm    Heart Rate (Exit) 87 bpm    Rating of Perceived Exertion (Exercise) 12    Symptoms None    Comments Reviewed METs and goals    Duration Continue with 30 min of aerobic exercise without signs/symptoms of physical distress.    Intensity THRR unchanged      Progression   Progression Continue to progress workloads to maintain intensity without signs/symptoms of physical distress.    Average METs 5.35      Resistance Training   Training Prescription No    Weight No weights on wednesdays      Interval Training   Interval Training No      Elliptical   Level 2    Speed 2    Minutes 15    METs 5.5      Rower   Level 6    Watts 53    Minutes 15    METs 5.16             Functional Capacity:   Psychological, QOL, Others - Outcomes: PHQ 2/9:    12/29/2023   10:07 AM 10/22/2023   11:15 AM  Depression screen PHQ 2/9  Decreased Interest 1 1  Down, Depressed, Hopeless 0 1  PHQ - 2 Score 1 2  Altered sleeping 1 1   Tired, decreased energy 2 1  Change in appetite 1 1  Feeling bad or failure about yourself  1 1  Trouble concentrating 0 1  Moving slowly or fidgety/restless 0 1  Suicidal thoughts 0 0  PHQ-9 Score 6 8  Difficult doing work/chores Somewhat difficult Somewhat difficult    Quality of Life:   Personal Goals: Goals established at orientation with interventions provided to work toward goal.    Personal Goals Discharge:  Goals and Risk Factor Review     Row Name 12/08/23 0917 01/01/24 1039 01/28/24 0933         Core Components/Risk Factors/Patient Goals Review   Personal Goals Review Weight Management/Obesity;Lipids;Stress Weight Management/Obesity;Lipids;Stress Weight Management/Obesity;Lipids;Stress     Review Exercise is currently on hold due to ED visit on 11/17/23. Need MD clearance to return. Billy Andersen has been doing well with exercise at cardiac rehab since cleared to return by Dr Elease Hashimoto. last day of attendance was on 12/21/23. Vital signs have been stable. Billy Andersen did not return to cardiac rehab and was discharged due to nonattendance.     Expected Outcomes Billy Andersen will conitnue to participate in cardiac rehab for exercise, nutrition  and lifestyle modifications. Billy Andersen will conitnue to participate in cardiac rehab for exercise, nutrition and lifestyle modifications. Billy Andersen will hopefully continue exercise, nutrition and lifestyle modifications on his own.              Exercise Goals and Review:   Exercise Goals Re-Evaluation:  Exercise Goals Re-Evaluation     Row Name 12/09/23 1523             Exercise Goal Re-Evaluation   Exercise Goals Review Increase Physical Activity;Understanding of Exercise Prescription;Increase Strength and Stamina;Knowledge and understanding of Target Heart Rate Range (THRR);Able to understand and use rate of perceived exertion (RPE) scale       Comments Reviewed METs and goals. Pt's short term goal is improved strength and stamina. Pt  with recent visit to ER and his CRP2 has been on hold. Pt cleared yesterday to return. Pt voices he had felt like his strength and stamina had improved but feels like he has lost that while on medical hold       Expected Outcomes Will continue to monitor the patient and progress exericse workloads as tolerated.                Nutrition & Weight - Outcomes:    Nutrition:   Nutrition Discharge:   Education Questionnaire Score:   Billy Andersen attended 22 exercise sessions between 10/22/23- 12/21/23. Billy Andersen did well with exercise when present. Billy Andersen has been discharged due to non attendance.Thayer Headings RN BSN

## 2024-01-25 ENCOUNTER — Ambulatory Visit (HOSPITAL_COMMUNITY): Payer: Medicaid Other

## 2024-01-27 ENCOUNTER — Ambulatory Visit (HOSPITAL_COMMUNITY): Payer: Medicaid Other

## 2024-01-27 ENCOUNTER — Encounter: Payer: Self-pay | Admitting: Gastroenterology

## 2024-01-29 ENCOUNTER — Ambulatory Visit (HOSPITAL_COMMUNITY): Payer: Medicaid Other

## 2024-02-04 ENCOUNTER — Ambulatory Visit (HOSPITAL_COMMUNITY): Payer: Medicaid Other | Attending: Internal Medicine

## 2024-02-04 DIAGNOSIS — R0602 Shortness of breath: Secondary | ICD-10-CM | POA: Diagnosis present

## 2024-02-04 LAB — ECHOCARDIOGRAM COMPLETE
Area-P 1/2: 3.27 cm2
P 1/2 time: 751 ms
S' Lateral: 3.2 cm

## 2024-02-05 ENCOUNTER — Telehealth: Payer: Self-pay | Admitting: *Deleted

## 2024-02-05 NOTE — Telephone Encounter (Signed)
   Pre-operative Risk Assessment    Patient Name: Billy Andersen  DOB: [birth year] MRN: [id number]   Date of last office visit: 01/05/24 DR. NASHER Date of next office visit: 05/06/24 Tereso Newcomer, [facility] AND 05/17/24 DR. CRENSHAW-FORMER DR. Melburn Popper PT PER APPT NOTES   Request for Surgical Clearance    Procedure:   VASECTOMY  Date of Surgery:  Clearance TBD                                Surgeon:  DR. GRAY Surgeon's Group or Practice Name:  [facility] Phone number:  [phone] Fax number:  [phone]   Type of Clearance Requested:   - Medical  - Pharmacy:  Hold Aspirin and Clopidogrel (Plavix)     Type of Anesthesia:  Not Indicated   Additional requests/questions:    Elpidio Anis   02/05/2024, 2:53 PM

## 2024-02-09 NOTE — Telephone Encounter (Signed)
   Name:  Billy Andersen  DOB:  January 03, 1976  MRN:  [id number]   Primary Cardiologist: Kristeen Miss, MD  Chart reviewed as part of pre-operative protocol coverage.   History reviewed with Dr. Elease Hashimoto. He recommends the patient postpone his procedure until he is 1 year out from his PCI (he can have the procedure done in 08/2024).  Pre-op covering staff: - Please notify requesting surgeon that the procedure should be postponed until 08/2024 - Please notify patient of the above - Note, the patient has 2 appointments in June. Please cancel the appt with Tereso Newcomer, [name]. The pt should keep the appt with Dr. Jens Som on 05/17/24.  Tereso Newcomer, [name] 02/09/2024, 4:57 PM

## 2024-02-10 ENCOUNTER — Encounter: Payer: Self-pay | Admitting: Cardiovascular Disease

## 2024-02-10 NOTE — Telephone Encounter (Signed)
 Call placed to pt.  He has been made aware that we postpone his procedure until he is 1 year out from his PCI (he can have the procedure done in 08/2024.  Pt agrees.  Will send back to [facility] to make them aware and have them to resend the clearance when it gets closer to the surgery date.

## 2024-02-16 ENCOUNTER — Telehealth: Payer: Self-pay

## 2024-02-16 DIAGNOSIS — I77810 Thoracic aortic ectasia: Secondary | ICD-10-CM

## 2024-02-16 NOTE — Telephone Encounter (Signed)
 Patient has viewed MD's comments and recommendations via MyChart. Repeat CTA order placed at this time to be done in 1 year.

## 2024-02-16 NOTE — Telephone Encounter (Signed)
-----   Message from Kristeen Miss sent at 02/10/2024  5:31 PM EDT ----- Normal left ventricular systolic function with ejection fraction 55 to 60%.  The diastolic function was normal. 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

## 2024-03-01 ENCOUNTER — Other Ambulatory Visit: Payer: Self-pay | Admitting: Cardiology

## 2024-03-13 NOTE — Progress Notes (Unsigned)
 Chief Complaint: Primary GI MD: Para Bold  HPI: Discussed the use of AI scribe software for clinical note transcription with the patient, who gave verbal consent to proceed.  History of Present Illness      PREVIOUS GI WORKUP     Past Medical History:  Diagnosis Date   ADHD    Bipolar 1 disorder (HCC)    CAD (coronary artery disease)    Metabolic dysfunction-associated steatohepatitis (MASH)    2021 severe fibrosis (hepatologist)   NSTEMI (non-ST elevated myocardial infarction) (HCC) 09/2023   DES RCA   Polycythemia    2021 hem/onc eval-->?spurius/?transient.  Iron normal   Ruptured plantar fascia    Right, 2022    Past Surgical History:  Procedure Laterality Date   CORONARY STENT INTERVENTION N/A 09/14/2023   Procedure: CORONARY STENT INTERVENTION;  Surgeon: Knox Perl, MD;  Location: [facility] INVASIVE CV LAB;  Service: Cardiovascular;  Laterality: N/A;   EXTERNAL EAR SURGERY     LEFT HEART CATH AND CORONARY ANGIOGRAPHY N/A 09/14/2023   Procedure: LEFT HEART CATH AND CORONARY ANGIOGRAPHY;  Surgeon: Knox Perl, MD;  Location: [facility] INVASIVE CV LAB;  Service: Cardiovascular;  Laterality: N/A;   LEFT HEART CATH AND CORONARY ANGIOGRAPHY N/A 09/22/2023   Procedure: LEFT HEART CATH AND CORONARY ANGIOGRAPHY;  Surgeon: Arnoldo Lapping, MD;  Location[facility] INVASIVE CV LAB;  Service: Cardiovascular;  Laterality: N/A;    Current Outpatient Medications  Medication Sig Dispense Refill   aspirin 81 MG chewable tablet Chew 1 tablet (81 mg total) by mouth daily. 90 tablet 2   atomoxetine (STRATTERA) 80 MG capsule Take 80 mg by mouth daily.     CAPLYTA 42 MG capsule Take 42 mg by mouth at bedtime.     clopidogrel (PLAVIX) 75 MG tablet TAKE ONE TABLET BY MOUTH EVERY DAY WITH BREAKFAST 90 tablet 3   lamoTRIgine (LAMICTAL) 200 MG tablet Take 200 mg by mouth daily.     metoprolol tartrate (LOPRESSOR) 25 MG tablet Take 1 tablet (25 mg total) by mouth 2 (two) times daily. 180 tablet 3   Misc  Natural Products (AIRBORNE ELDERBERRY) 100-50 MG CHEW Chew 2 tablets by mouth daily.     nitroGLYCERIN (NITROSTAT) 0.4 MG SL tablet Place 1 tablet (0.4 mg total) under the tongue every 5 (five) minutes x 3 doses as needed for chest pain. 25 tablet 1   Omega Fatty Acids-Vitamins (OMEGA-3 GUMMIES) CHEW Chew 2 tablets by mouth daily. VitaFusion     rosuvastatin (CRESTOR) 40 MG tablet TAKE ONE TABLET BY MOUTH EVERY DAY 90 tablet 3   No current facility-administered medications for this visit.    Allergies as of 03/14/2024   (No Known Allergies)    Family History  Problem Relation Age of Onset   Rectal cancer Mother    Gallbladder disease Mother 17       Cholangiocarcinoma   Heart disease Father 29       CABG    Social History   Socioeconomic History   Marital status: Married    Spouse name: Not on file   Number of children: Not on file   Years of education: Not on file   Highest education level: Some college, no degree  Occupational History   Not on file  Tobacco Use   Smoking status: Never   Smokeless tobacco: Never  Vaping Use   Vaping status: Never Used  Substance and Sexual Activity   Alcohol use: Not Currently   Drug use: Never   Sexual activity:  Not on file  Other Topics Concern   Not on file  Social History Narrative   Married, 2 daughters.   Occupation: Works for [employer] family heritage.   No tobacco, alcohol, or drugs.   Social Drivers of Corporate investment banker Strain: Low Risk  (12/29/2023)   Overall Financial Resource Strain (CARDIA)    Difficulty of Paying Living Expenses: Not very hard  Food Insecurity: Food Insecurity Present (12/29/2023)   Hunger Vital Sign    Worried About Running Out of Food in the Last Year: Sometimes true    Ran Out of Food in the Last Year: Never true  Transportation Needs: No Transportation Needs (12/29/2023)   PRAPARE - Administrator, Civil Service (Medical): No    Lack of Transportation (Non-Medical): No   Physical Activity: Insufficiently Active (12/29/2023)   Exercise Vital Sign    Days of Exercise per Week: 3 days    Minutes of Exercise per Session: 40 min  Stress: No Stress Concern Present (12/29/2023)   [employer] of Occupational Health - Occupational Stress Questionnaire    Feeling of Stress : Only a little  Social Connections: Moderately Integrated (12/29/2023)   Social Connection and Isolation Panel [NHANES]    Frequency of Communication with Friends and Family: Twice a week    Frequency of Social Gatherings with Friends and Family: Once a week    Attends Religious Services: 1 to 4 times per year    Active Member of [employer] or Organizations: No    Attends Engineer, structural: Not on file    Marital Status: Married  Catering manager Violence: Not At Risk (09/22/2023)   Humiliation, Afraid, Rape, and Kick questionnaire    Fear of Current or Ex-Partner: No    Emotionally Abused: No    Physically Abused: No    Sexually Abused: No    Review of Systems:    Constitutional: No weight loss, fever, chills, weakness or fatigue HEENT: Eyes: No change in vision               Ears, Nose, Throat:  No change in hearing or congestion Skin: No rash or itching Cardiovascular: No chest pain, chest pressure or palpitations   Respiratory: No SOB or cough Gastrointestinal: See HPI and otherwise negative Genitourinary: No dysuria or change in urinary frequency Neurological: No headache, dizziness or syncope Musculoskeletal: No new muscle or joint pain Hematologic: No bleeding or bruising Psychiatric: No history of depression or anxiety    Physical Exam:  Vital signs: There were no vitals taken for this visit.  Constitutional: NAD, Well developed, Well nourished, alert and cooperative Head:  Normocephalic and atraumatic. Eyes:   PEERL, EOMI. No icterus. Conjunctiva pink. Respiratory: Respirations even and unlabored. Lungs clear to auscultation bilaterally.   No wheezes,  crackles, or rhonchi.  Cardiovascular:  Regular rate and rhythm. No peripheral edema, cyanosis or pallor.  Gastrointestinal:  Soft, nondistended, nontender. No rebound or guarding. Normal bowel sounds. No appreciable masses or hepatomegaly. Rectal:  Not performed.  Msk:  Symmetrical without gross deformities. Without edema, no deformity or joint abnormality.  Neurologic:  Alert and  oriented x4;  grossly normal neurologically.  Skin:   Dry and intact without significant lesions or rashes. Psychiatric: Oriented to person, place and time. Demonstrates good judgement and reason without abnormal affect or behaviors.  Physical Exam    RELEVANT LABS AND IMAGING: CBC    Component Value Date/Time   WBC 4.1 11/17/2023 1322  RBC 5.24 11/17/2023 1322   HGB 16.3 11/17/2023 1322   HGB 16.2 09/14/2020 1535   HCT 47.6 11/17/2023 1322   PLT 183 11/17/2023 1322   PLT 211 09/14/2020 1535   MCV 90.8 11/17/2023 1322   MCH 31.1 11/17/2023 1322   MCHC 34.2 11/17/2023 1322   RDW 12.6 11/17/2023 1322   LYMPHSABS 1.8 09/13/2023 0819   MONOABS 0.5 09/13/2023 0819   EOSABS 0.0 09/13/2023 0819   BASOSABS 0.0 09/13/2023 0819    CMP     Component Value Date/Time   NA 142 12/08/2023 0924   K 3.9 12/08/2023 0924   CL 104 12/08/2023 0924   CO2 24 12/08/2023 0924   GLUCOSE 65 (L) 12/08/2023 0924   GLUCOSE 122 (H) 11/17/2023 1322   BUN 11 12/08/2023 0924   CREATININE 1.03 12/08/2023 0924   CREATININE 1.03 09/14/2020 1535   CALCIUM 9.4 12/08/2023 0924   PROT 6.6 12/08/2023 0924   ALBUMIN 4.6 12/08/2023 0924   AST 35 12/08/2023 0924   AST 55 (H) 09/14/2020 1535   ALT 35 12/08/2023 0924   ALT 74 (H) 09/14/2020 1535   ALKPHOS 59 12/08/2023 0924   BILITOT 0.4 12/08/2023 0924   BILITOT 0.7 09/14/2020 1535   GFRNONAA >60 11/17/2023 1322   GFRNONAA >60 09/14/2020 1535     Assessment/Plan:   Assessment and Plan Assessment & Plan    History of NSTEMI s/p PCI to RCA October 2024 Follows with  Dr. Floria Hurst.  Echocardiogram with EF 55 to 60%.  History of NSTEMI s/p PCI October 2024 and then he returned a week later with recurrent symptoms and repeat cath showed patent stent.  On Plavix and aspirin  SVT On metoprolol 25 twice daily    Nadirah Socorro [facility] 03/13/2024, 10:04 PM  Cc: McGowen, Minetta Aly, MD

## 2024-03-14 ENCOUNTER — Other Ambulatory Visit (INDEPENDENT_AMBULATORY_CARE_PROVIDER_SITE_OTHER)

## 2024-03-14 ENCOUNTER — Ambulatory Visit (INDEPENDENT_AMBULATORY_CARE_PROVIDER_SITE_OTHER): Payer: Medicaid Other | Admitting: Gastroenterology

## 2024-03-14 ENCOUNTER — Encounter: Payer: Self-pay | Admitting: Gastroenterology

## 2024-03-14 VITALS — BP 96/70 | HR 72 | Ht 74.0 in | Wt 224.2 lb

## 2024-03-14 DIAGNOSIS — K5900 Constipation, unspecified: Secondary | ICD-10-CM | POA: Diagnosis not present

## 2024-03-14 DIAGNOSIS — I214 Non-ST elevation (NSTEMI) myocardial infarction: Secondary | ICD-10-CM

## 2024-03-14 DIAGNOSIS — Z1211 Encounter for screening for malignant neoplasm of colon: Secondary | ICD-10-CM

## 2024-03-14 DIAGNOSIS — K760 Fatty (change of) liver, not elsewhere classified: Secondary | ICD-10-CM

## 2024-03-14 DIAGNOSIS — K7581 Nonalcoholic steatohepatitis (NASH): Secondary | ICD-10-CM

## 2024-03-14 LAB — TSH: TSH: 1.31 u[IU]/mL (ref 0.35–5.50)

## 2024-03-14 NOTE — Patient Instructions (Signed)
 Start taking Miralax 1 capful (17 grams) 1x / day for 1 week.   If this is not effective, increase to 1 dose 2x / day for 1 week.   If this is still not effective, increase to two capfuls (34 grams) 2x / day.   Can adjust dose as needed based on response. Can take 1/2 cap daily, skip days, or increase per day.    Your provider has requested that you go to the basement level for lab work before leaving today. Press "B" on the elevator. The lab is located at the first door on the left as you exit the elevator.  You have been scheduled for an abdominal ultrasound at [facility] (1st floor of hospital) on 03/17/24 at 8:30am. Please arrive 30 minutes prior to your appointment for registration. Make certain not to have anything to eat or drink after midnight. Should you need to reschedule your appointment, please contact radiology at [phone]. This test typically takes about 30 minutes to perform.  Due to recent changes in healthcare laws, you may see the results of your imaging and laboratory studies on MyChart before your provider has had a chance to review them.  We understand that in some cases there may be results that are confusing or concerning to you. Not all laboratory results come back in the same time frame and the provider may be waiting for multiple results in order to interpret others.  Please give us  48 hours in order for your provider to thoroughly review all the results before contacting the office for clarification of your results. _________________________________________________  If your blood pressure at your visit was 140/90 or greater, please contact your primary care physician to follow up on this. _____________________________________________________  If you are age 52 or older, your body mass index should be between 23-30. Your Body mass index is 28.79 kg/m. If this is out of the aforementioned range listed, please consider follow up with your Primary Care  Provider.  If you are age 59 or younger, your body mass index should be between 19-25. Your Body mass index is 28.79 kg/m. If this is out of the aformentioned range listed, please consider follow up with your Primary Care Provider.  _______________________________________________________  The [facility] providers would like to encourage you to use MYCHART to communicate with providers for non-urgent requests or questions.  Due to long hold times on the telephone, sending your provider a message by [facility] may be a faster and more efficient way to get a response.  Please allow 48 business hours for a response.  Please remember that this is for non-urgent requests.  _______________________________________________________ Thank you for trusting me with your gastrointestinal care!   Suzanna Erp, PA

## 2024-03-17 ENCOUNTER — Ambulatory Visit (HOSPITAL_COMMUNITY)
Admission: RE | Admit: 2024-03-17 | Discharge: 2024-03-17 | Disposition: A | Discharge status: Home / Self Care | Source: Ambulatory Visit | Attending: Gastroenterology | Admitting: Gastroenterology

## 2024-03-17 ENCOUNTER — Ambulatory Visit: Payer: Self-pay | Admitting: Neurology

## 2024-03-17 DIAGNOSIS — K7581 Nonalcoholic steatohepatitis (NASH): Secondary | ICD-10-CM | POA: Diagnosis present

## 2024-03-24 ENCOUNTER — Telehealth: Payer: Self-pay | Admitting: Gastroenterology

## 2024-03-24 NOTE — Progress Notes (Signed)
 Agree with the assessment and plan as outlined by Suzanna Erp, [name].  If repeat elastography confirms fibrosis, patient would likely be a good candidate for Rezdiffra.  I agree with holding off on colonoscopy until patient can more safely hold Plavix .

## 2024-03-24 NOTE — Telephone Encounter (Signed)
 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  Called patient to discuss possibility of starting Rezdiffra. He is on crestor  and plavix . Extensive discussion with patient about information below. He is interested in taking the medication, but would like to call us  back after he discusses with his cardiologist.      Rezdiffra is a thyroid  receptor beta agonist, it is the first FDA approved medication for fibrosis of the liver which can lead to cirrhosis or liver failure. It works to decrease triglycerides stored in the liver and helps stop the progression and potentially reverse stage II and stage III fibrosis. Based off your fibrosis scores, through labs and imaging, your provider is considering this medication for you. This is taken once daily. Common side effects may be diarrhea and nausea but this is usually self-limiting within the first 30 days. There is a risk for worsening gallbladder inflammation such as gallstones, if you develop right upper quadrant abdominal pain, yellowing of skin or eyes will take the medication please inform your doctor or go to the ER.   Please notify your physician if you are started on a statin medication or medication for cholesterol, this medication can increase the lipid lowering effects of certain medications like Crestor , Lipitor, simvastatin, pravastatin and the dose needs to be reduced. Please notify your physician if you ever started on clopidogrel  or Plavix  which is a blood thinner.

## 2024-03-25 ENCOUNTER — Telehealth: Payer: Self-pay | Admitting: Cardiology

## 2024-03-25 NOTE — Telephone Encounter (Signed)
 Patient states he needs a note for work from when he was out. Please advise
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
## 2024-03-25 NOTE — Telephone Encounter (Signed)
 Spoke to patient . 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  Patient states he has been out of work since Sep 13, 2023 until the present time April 25 , 2025. Pqatient has not return to work.    Patient states he has been recovering from a heart attack  in Oct 2024.  RN asked patient if he had discuss with Dr Alroy Aspen  about not to be able to  work . Patient  states they did but nothing had been finalized.   RN informed patient this message will be sent to Dr Alroy Aspen for review.

## 2024-03-29 ENCOUNTER — Encounter: Payer: Self-pay | Admitting: Neurology

## 2024-03-29 ENCOUNTER — Ambulatory Visit: Admitting: Neurology

## 2024-03-29 VITALS — BP 123/80 | HR 70 | Ht 74.0 in | Wt 224.6 lb

## 2024-03-29 DIAGNOSIS — R29810 Facial weakness: Secondary | ICD-10-CM

## 2024-03-29 DIAGNOSIS — R4689 Other symptoms and signs involving appearance and behavior: Secondary | ICD-10-CM

## 2024-03-29 DIAGNOSIS — G4730 Sleep apnea, unspecified: Secondary | ICD-10-CM

## 2024-03-29 DIAGNOSIS — R200 Anesthesia of skin: Secondary | ICD-10-CM

## 2024-03-29 DIAGNOSIS — R2689 Other abnormalities of gait and mobility: Secondary | ICD-10-CM

## 2024-03-29 DIAGNOSIS — R42 Dizziness and giddiness: Secondary | ICD-10-CM

## 2024-03-29 DIAGNOSIS — Z87820 Personal history of traumatic brain injury: Secondary | ICD-10-CM

## 2024-03-29 DIAGNOSIS — M542 Cervicalgia: Secondary | ICD-10-CM

## 2024-03-29 DIAGNOSIS — R519 Headache, unspecified: Secondary | ICD-10-CM | POA: Diagnosis not present

## 2024-03-29 DIAGNOSIS — R29898 Other symptoms and signs involving the musculoskeletal system: Secondary | ICD-10-CM

## 2024-03-29 DIAGNOSIS — R4189 Other symptoms and signs involving cognitive functions and awareness: Secondary | ICD-10-CM | POA: Diagnosis not present

## 2024-03-29 DIAGNOSIS — S0990XA Unspecified injury of head, initial encounter: Secondary | ICD-10-CM

## 2024-03-29 DIAGNOSIS — E52 Niacin deficiency [pellagra]: Secondary | ICD-10-CM

## 2024-03-29 DIAGNOSIS — M5412 Radiculopathy, cervical region: Secondary | ICD-10-CM

## 2024-03-29 DIAGNOSIS — G4769 Other sleep related movement disorders: Secondary | ICD-10-CM

## 2024-03-29 DIAGNOSIS — H539 Unspecified visual disturbance: Secondary | ICD-10-CM

## 2024-03-29 DIAGNOSIS — E519 Thiamine deficiency, unspecified: Secondary | ICD-10-CM

## 2024-03-29 DIAGNOSIS — E538 Deficiency of other specified B group vitamins: Secondary | ICD-10-CM

## 2024-03-29 DIAGNOSIS — G4719 Other hypersomnia: Secondary | ICD-10-CM

## 2024-03-29 DIAGNOSIS — E559 Vitamin D deficiency, unspecified: Secondary | ICD-10-CM

## 2024-03-29 NOTE — Patient Instructions (Addendum)
 MRI of the brain w/wo contrast and MRI cervical spine wo contrast Formal neuropsychiatric/neurocognitive testing  Blood work: [facility] [address] [facility] [place] [phone] Malampati 4 and large neck risk factor for obstructive sleep apnea: sleep consult  Sleep Apnea  Sleep apnea is a condition that affects your breathing while you are sleeping. Your tongue or soft tissue in your throat may block the flow of air while you sleep. You may have shallow breathing or stop breathing for short periods of time. People with sleep apnea may snore loudly. There are three kinds of sleep apnea: Obstructive sleep apnea. This kind is caused by a blocked or collapsed airway. This is the most common. Central sleep apnea. This kind happens when the part of the brain that controls breathing does not send the correct signals to the muscles that control breathing. Mixed sleep apnea. This is a combination of obstructive and central sleep apnea. What are the causes? The most common cause of sleep apnea is a collapsed or blocked airway. What increases the risk? Being very overweight. Having family members with sleep apnea. Having a tongue or tonsils that are larger than normal. Having a small airway or jaw problems. Being older. What are the signs or symptoms? Loud snoring. Restless sleep. Trouble staying asleep. Being sleepy or tired during the day. Waking up gasping or choking. Having a headache in the morning. Mood swings. Having a hard time remembering things and concentrating. How is this diagnosed? A medical history. A physical exam. A sleep study. This is also called a polysomnography test. This test is done at a sleep lab or in your home while you are sleeping. How is this treated? Treatment may include: Sleeping on your side. Losing weight if you're overweight. Wearing an oral appliance. This is a mouthpiece that moves your lower jaw forward. Using a positive airway pressure  (PAP) device to keep your airways open while you sleep, such as: A continuous positive airway pressure (CPAP) device. This device gives forced air through a mask when you breathe out. This keeps your airways open. A bilevel positive airway pressure (BIPAP) device. This device gives forced air through a mask when you breathe in and when you breathe out to keep your airways open. Having surgery if other treatments do not work. If your sleep apnea is not treated, you may be at risk for: Heart failure. Heart attack. Stroke. Type 2 diabetes or a problem with your blood sugar called insulin resistance. Follow these instructions at home: Medicines Take your medicines only as told by your health care provider. Avoid alcohol, medicines to help you relax, and certain pain medicines. These may make sleep apnea worse. General instructions Do not smoke, vape, or use products with nicotine or tobacco in them. If you need help quitting, talk with your provider. If you were given a PAP device to open your airway while you sleep, use it as told by your provider. If you're having surgery, make sure to tell your provider you have sleep apnea. You may need to bring your PAP device with you. Contact a health care provider if: The PAP device that you were given to use during sleep bothers you or does not seem to be working. You do not feel better or you feel worse. Get help right away if: You have trouble breathing. You have chest pain. You have trouble talking. One side of your body feels weak. A part of your face is hanging down. These symptoms may be an emergency. Call  911 right away. Do not wait to see if the symptoms will go away. Do not drive yourself to the hospital. This information is not intended to replace advice given to you by your health care provider. Make sure you discuss any questions you have with your health care provider. Document Revised: 08/20/2023 Document Reviewed:  01/22/2023 Elsevier Patient Education  2024 [employer]  Cervical Radiculopathy  Cervical radiculopathy happens when a nerve in the neck (a cervical nerve) is pinched or bruised. This condition can happen because of an injury to the cervical spine (vertebrae) in the neck, or as part of the normal aging process. Pressure on the cervical nerves can cause pain or numbness that travels from the neck all the way down to the arm and fingers. This condition usually gets better with rest. Treatment may be needed if the condition does not improve. What are the causes? This condition may be caused by: A neck injury. A bulging (herniated) disk. Muscle spasms. Muscle tightness in the neck due to overuse. Arthritis. Breakdown or degeneration in the bones and joints of the spine (spondylosis) due to aging. Bone spurs that may develop near the cervical nerves. What are the signs or symptoms? Symptoms of this condition include: Pain. The pain may travel from the neck to the arm and hand. The pain can be severe or irritating. It may get worse when you move your neck. Numbness or tingling in your arm or hand. Weakness in the affected arm and hand, in severe cases. How is this diagnosed? This condition may be diagnosed based on your symptoms, your medical history, and a physical exam. You may also have tests, including: X-rays. CT scan. MRI. Electromyogram (EMG). Nerve conduction tests. How is this treated? In many cases, treatment is not needed for this condition. With rest, the condition usually gets better over time. If treatment is needed, options may include: Wearing a soft neck collar (cervical collar) for short periods of time. Doing physical therapy to strengthen your neck muscles. Taking medicines. These may include NSAIDs, such as ibuprofen, or oral corticosteroids. Having spinal injections, in severe cases. Having surgery. This may be needed if other treatments do not help. Different  types of surgery may be done depending on the cause of this condition. Follow these instructions at home: If you have a cervical collar: Wear it as told by your health care provider. Remove it only as told by your health care provider. Ask your health care provider if you can remove the cervical collar for cleaning and bathing. If you are allowed to remove the collar for cleaning or bathing: Follow instructions from your health care provider about how to remove the collar safely. Clean the collar by wiping it with mild soap and water and drying it completely. Take out any removable pads in the collar every 1-2 days, and wash them by hand with soap and water. Let them air-dry completely before you put them back in the collar. Check your skin under the collar for irritation or sores. If you see any, tell your health care provider. Managing pain     Take over-the-counter and prescription medicines only as told by your health care provider. If directed, put ice on the affected area. To do this: If you have a soft neck collar, remove it as told by your health care provider. Put ice in a plastic bag. Place a towel between your skin and the bag. Leave the ice on for 20 minutes, 2-3 times a day. Remove  the ice if your skin turns bright red. This is very important. If you cannot feel pain, heat, or cold, you have a greater risk of damage to the area. If applying ice does not help, you can try using heat. Use the heat source that your health care provider recommends, such as a moist heat pack or a heating pad. Place a towel between your skin and the heat source. Leave the heat on for 20-30 minutes. Remove the heat if your skin turns bright red. This is especially important if you are unable to feel pain, heat, or cold. You have a greater risk of getting burned. Try a gentle neck and shoulder massage to help relieve symptoms. Activity Rest as needed. Return to your normal activities as told by your  health care provider. Ask your health care provider what activities are safe for you. Do stretching and strengthening exercises as told by your health care provider or your physical therapist. You may have to avoid lifting. Ask your health care provider how much you can safely lift. General instructions Use a flat pillow when you sleep. Do not drive while wearing a cervical collar. If you do not have a cervical collar, ask your health care provider if it is safe to drive while your neck heals. Ask your health care provider if the medicine prescribed to you requires you to avoid driving or using machinery. Do not use any products that contain nicotine or tobacco. These products include cigarettes, chewing tobacco, and vaping devices, such as e-cigarettes. If you need help quitting, ask your health care provider. Keep all follow-up visits. This is important. Contact a health care provider if: Your condition does not improve with treatment. Get help right away if: Your pain gets much worse and is not controlled with medicines. You have weakness or numbness in your hand, arm, face, or leg. You have a high fever. You have a stiff, rigid neck. You lose control of your bowels or your bladder (have incontinence). You have trouble with walking, balance, or speaking. Summary Cervical radiculopathy happens when a nerve in the neck is pinched or bruised. A nerve can get pinched from a bulging disk, arthritis, muscle spasms, or an injury to the neck. Symptoms include pain, tingling, or numbness radiating from the neck to the arm or hand. Weakness can also occur in severe cases. Treatment may include rest, wearing a cervical collar, and physical therapy. Medicines may be prescribed to help with pain. In severe cases, injections or surgery may be needed. This information is not intended to replace advice given to you by your health care provider. Make sure you discuss any questions you have with your health  care provider. Document Revised: 05/23/2021 Document Reviewed: 05/23/2021 Elsevier Patient Education  2024 Else[employer] Inc.Concussion, Adult  A concussion is a brain injury from a hard, direct hit (trauma) to the head or body. This direct hit causes the brain to shake quickly back and forth inside the skull. This can damage brain cells and cause chemical changes in the brain. A concussion may also be known as a mild traumatic brain injury (TBI). The effects of a concussion can be serious. If you have a concussion, you should be very careful to avoid having a second concussion. What are the causes? This condition is caused by: A direct hit to your head. Sudden movement of your body that causes your brain to move back and forth inside the skull, such as in a car crash. What are the signs  or symptoms? The signs of a concussion can be hard to notice. Early on, they may be missed by you, family members, and health care providers. You may look fine on the outside but may act or feel differently. Every head injury is different. Symptoms are usually temporary but may last for days, weeks, or even months. Some symptoms appear right away, but other symptoms may not show up for hours or days. Physical symptoms Headaches. Dizziness and problems with coordination or balance. Sensitivity to light or noise. Nausea or vomiting. Tiredness (fatigue). Vision or hearing problems. Seizure. Mental and emotional symptoms Irritability or mood changes. Memory problems. Trouble concentrating, organizing, or making decisions. Changes in eating or sleeping patterns. Slowness in thinking, acting or reacting, speaking, or reading. Anxiety or depression. How is this diagnosed? This condition is diagnosed based on your symptoms and injury. You may also have tests, including: Imaging tests, such as a CT scan or an MRI. Neuropsychological tests. These measure your thinking, understanding, learning, and memory. How is  this treated? Treatment for this condition includes: Stopping sports or activity if you are injured. Physical and mental rest and careful observation, usually at home. Medicines to help with symptoms such as headaches, nausea, or difficulty sleeping. Referral to a concussion clinic or rehab center. Follow these instructions at home: Activity Limit activities that require a lot of thought or concentration, such as: Doing homework or job-related work. Watching TV. Using the computer or phone. Playing memory games and doing puzzles. Rest helps your brain heal. Make sure you: Get plenty of sleep. Most adults should get 7-9 hours of sleep each night. Rest during the day. Take naps or rest breaks when you feel tired. Avoid high-intensity exercise or physical activities that take a lot of effort. Stop any activity that worsens symptoms. Your health care provider may recommend light exercise such as walking. Do not do high-risk activities that could cause a second concussion, such as riding a bike or playing sports. Ask your health care provider when you can return to your normal activities, such as school, work, sports, and driving. Your ability to react may be slower after a brain injury. Never do these activities if you are dizzy. General instructions  Take over-the-counter and prescription medicines only as told by your health care provider. Some medicines, such as blood thinners (anticoagulants) and aspirin , may increase the risk for complications, such as bleeding. Avoid taking opioid pain medicine while recovering from a concussion. Do not drink alcohol until your health care provider says you can. Drinking alcohol may slow your recovery and can put you at risk of further injury. Watch your symptoms and tell others around you to do the same. Complications sometimes occur after a concussion. Tell your work Production designer, theatre/television/film, teachers, Tax adviser, school counselor, coach, or sports trainer about your  injury, symptoms, and restrictions. See a mental health therapist if you feel anxious or depressed. Managing this condition can be challenging. Keep all follow-up visits. Your health care provider will check on your recovery and give you a plan for returning to activities. How is this prevented? Avoiding another brain injury is very important. In rare cases, another injury can lead to permanent brain damage, brain swelling, or death. The risk of this is greatest during the first 7-10 days after a head injury. Avoid injuries by: Stopping activities that could lead to a second concussion, such as contact or recreational sports, until your health care provider says it is okay. Taking these actions once you have  returned to sports or activities: Avoid plays or moves that can cause you to crash into another person. This is how most concussions occur. Follow the rules and be respectful of other players. Do not engage in violent or illegal plays. Getting regular exercise that includes strength and balance training. Wearing a properly fitting helmet during sports, biking, or other activities. Helmets can help protect you from serious skull and brain injuries, but they may not protect you from a concussion. Even when wearing a helmet, you should avoid being hit in the head. Where to find more information Centers for Disease Control and Prevention: [employer]no Contact a health care provider if: Your symptoms do not improve or get worse. You have new symptoms. You have another injury. Your coordination gets worse. You have unusual behavior changes. Get help right away if: You have a severe or worsening headache. You have weakness or numbness in any part of your body, slurred speech, vision changes, or confusion. You vomit repeatedly. You lose consciousness, are sleepier than normal, or are difficult to wake up. You have a seizure. These symptoms may be an emergency. Get help right away. Call 911. Do not  wait to see if the symptoms will go away. Do not drive yourself to the hospital. Also, get help right away if: You have thoughts of hurting yourself or others. Take one of these steps if you feel like you may hurt yourself or others, or have thoughts about taking your own life: Go to your nearest emergency room. Call 911. Call the National Suicide Prevention Lifeline at 309-862-2552 or 988. This is open 24 hours a day. Text the Crisis Text Line at 248-503-1391. This information is not intended to replace advice given to you by your health care provider. Make sure you discuss any questions you have with your health care provider. Document Revised: 04/11/2022 Document Reviewed: 04/11/2022 Elsevier Patient Education  2024 [employer].

## 2024-03-30 ENCOUNTER — Encounter: Payer: Self-pay | Admitting: Cardiovascular Disease

## 2024-03-30 NOTE — Telephone Encounter (Signed)
 Called and spoke to patient who states that he is wanting a letter that states he could be out of work for 3 months, not 6. He says that he did return to work, but not full time. He agrees that he didn't have a complication and did complete 22 sessions of cardiac rehab without issues, but further states "I just didn't get back on my feet, to my normal." Questioned patient as to why he didn't ask for this at his visit with Nahser on 01/05/24 and he states "well, I just didn't think to bring it up." Spoke to Dr Alroy Aspen during clinic who states that he cannot medically justify him staying out of work for 3 months. Called and spoke with patient today who states he's fine and understands this. Appreciative of return call.

## 2024-03-31 ENCOUNTER — Telehealth: Payer: Self-pay | Admitting: Neurology

## 2024-03-31 NOTE — Telephone Encounter (Signed)
 Can you please send the labs I just ordered to : Blood work: [facility] [address] [facility] [place] [phone]  Let patient know when they are faxed there  Also please letpatient know as part his evaluation I would like to order an EEG is ok with him thank you

## 2024-04-01 ENCOUNTER — Other Ambulatory Visit: Payer: Self-pay

## 2024-04-01 DIAGNOSIS — E538 Deficiency of other specified B group vitamins: Secondary | ICD-10-CM

## 2024-04-01 DIAGNOSIS — E559 Vitamin D deficiency, unspecified: Secondary | ICD-10-CM

## 2024-04-01 DIAGNOSIS — E52 Niacin deficiency [pellagra]: Secondary | ICD-10-CM

## 2024-04-01 DIAGNOSIS — R519 Headache, unspecified: Secondary | ICD-10-CM

## 2024-04-01 DIAGNOSIS — R29810 Facial weakness: Secondary | ICD-10-CM

## 2024-04-01 DIAGNOSIS — R4189 Other symptoms and signs involving cognitive functions and awareness: Secondary | ICD-10-CM

## 2024-04-01 DIAGNOSIS — R42 Dizziness and giddiness: Secondary | ICD-10-CM

## 2024-04-01 DIAGNOSIS — E519 Thiamine deficiency, unspecified: Secondary | ICD-10-CM

## 2024-04-01 NOTE — Telephone Encounter (Signed)
 Labs Have been Faxed over to [facility]

## 2024-04-01 NOTE — Telephone Encounter (Signed)
 Faxed over labs to [facility] at [phone]

## 2024-04-01 NOTE — Telephone Encounter (Signed)
 LVM Letting pt know that Dr. Tresia Fruit would like to schedule a EEG with him

## 2024-04-03 ENCOUNTER — Encounter: Payer: Self-pay | Admitting: Neurology

## 2024-04-03 DIAGNOSIS — R4689 Other symptoms and signs involving appearance and behavior: Secondary | ICD-10-CM | POA: Insufficient documentation

## 2024-04-03 DIAGNOSIS — Z87820 Personal history of traumatic brain injury: Secondary | ICD-10-CM | POA: Insufficient documentation

## 2024-04-03 DIAGNOSIS — R4189 Other symptoms and signs involving cognitive functions and awareness: Secondary | ICD-10-CM | POA: Insufficient documentation

## 2024-04-04 ENCOUNTER — Telehealth: Payer: Self-pay

## 2024-04-04 NOTE — Telephone Encounter (Signed)
 Please let patient know so he can go to the lab thank you

## 2024-04-04 NOTE — Telephone Encounter (Signed)
 Referral sent to Dr Elmer Hackney at [facility] (P) [phone] (F) [phone]

## 2024-04-05 ENCOUNTER — Ambulatory Visit: Admitting: Neurology

## 2024-04-05 DIAGNOSIS — R4189 Other symptoms and signs involving cognitive functions and awareness: Secondary | ICD-10-CM

## 2024-04-05 DIAGNOSIS — R4182 Altered mental status, unspecified: Secondary | ICD-10-CM | POA: Diagnosis not present

## 2024-04-06 ENCOUNTER — Encounter: Payer: Self-pay | Admitting: Neurology

## 2024-04-06 NOTE — Procedures (Signed)
    History:  48 year old man with cognitive and behavioral changes. EEG to rule out seizure   EEG classification: Awake and drowsy  Duration: 26 minutes   Technical aspects: This EEG study was done with scalp electrodes positioned according to the 10-20 International system of electrode placement. Electrical activity was reviewed with band pass filter of 1-70Hz , sensitivity of 7 uV/mm, display speed of 25mm/sec with a 60Hz  notched filter applied as appropriate. EEG data were recorded continuously and digitally stored.   Description of the recording: The background rhythms of this recording consists of a fairly well modulated medium amplitude alpha rhythm of 12 Hz that is reactive to eye opening and closure. Present in the anterior head region is a 15-20 Hz beta activity. Photic stimulation was performed, did not show any abnormalities. Hyperventilation was also performed, did not show any abnormalities. Drowsiness was not seen. No abnormal epileptiform discharges seen during this recording. There was no focal slowing. There were no electrographic seizure identified.   Abnormality: None   Impression: This is a normal awake EEG. No evidence of interictal epileptiform discharges. Normal EEGs, however, do not rule out epilepsy.    Jameyah Fennewald, MD [facility]

## 2024-04-13 ENCOUNTER — Other Ambulatory Visit: Payer: Self-pay | Admitting: Cardiology

## 2024-04-15 MED ORDER — REZDIFFRA 100 MG PO TABS: 1.0000 | ORAL_TABLET | Freq: Every day | ORAL | Status: DC

## 2024-04-27 ENCOUNTER — Telehealth: Payer: Self-pay | Admitting: Gastroenterology

## 2024-04-27 NOTE — Telephone Encounter (Signed)
 Patient called and stated that he was talking to [facility] regarding Rezdiffra  was wondering if he can get that medication prescribed to him since he did speak to his cardiologist about that medication and stated that he was in the clear to take it. Patient is requesting a call back to inform him that Rezdiffra  was sent to his pharmacy [facility] in [place]. Please advise.

## 2024-04-27 NOTE — Telephone Encounter (Signed)
 See 03/14/24 patient message for additional information.

## 2024-04-28 ENCOUNTER — Encounter: Payer: Self-pay | Admitting: Neurology

## 2024-04-28 ENCOUNTER — Ambulatory Visit: Admitting: Neurology

## 2024-04-28 VITALS — BP 100/66 | HR 68 | Ht 74.0 in | Wt 220.0 lb

## 2024-04-28 DIAGNOSIS — R0683 Snoring: Secondary | ICD-10-CM | POA: Diagnosis not present

## 2024-04-28 DIAGNOSIS — R519 Headache, unspecified: Secondary | ICD-10-CM

## 2024-04-28 DIAGNOSIS — Z87820 Personal history of traumatic brain injury: Secondary | ICD-10-CM | POA: Diagnosis not present

## 2024-04-28 DIAGNOSIS — G478 Other sleep disorders: Secondary | ICD-10-CM | POA: Diagnosis not present

## 2024-04-28 DIAGNOSIS — Z8659 Personal history of other mental and behavioral disorders: Secondary | ICD-10-CM

## 2024-04-28 NOTE — Progress Notes (Signed)
 SLEEP MED[facility]INE [facility]INIC    Provider:  Neomia Banner, MD  Primary Care Physician:  Shelvia Dick, MD [address] [place] [address]     Referring Provider: Glory Larsen, Md [address] [place],  [place] [address]          Chief Complaint according to patient   Patient presents with:     New Patient (Initial Visit)     Dr Tresia Fruit-  States that he is averaging about 5-6 hrs of sleep but its broken sleep. Snores in sleep unsure about apnea. Had a dental sleep study done over 3 yrs ago and was unsure of the findings. 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 male patient who is seen  on 04/28/2024 upon referral by Dr. Tresia Fruit  who sees for a TBI, concussion, shoulder an neck pain. .The patient had a Myocardial infarction with right Coronary art. blockage  .  Had EEG and is pending MRI brain next week, per dr Tresia Fruit Concerned about  memory and  the effects of sleep.    Chief concern according to patient :  see above    I have the pleasure of seeing Billy Andersen 04/28/24 a right -handed male with a possible sleep disorder.     Sleep relevant medical history: CAD, family history for both GFs affected by early cardiac death.  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  No Tonsillectomy, cervical spine surgery/** deviated septum repair? UPPP?    Family medical /sleep history: no  family member on CPAP with OSA, many snorers. insomnia, father with thyroid  disease, mother rectal cancer, CVA, both GF died of heart disease before 73.    Social history:  Patient is working as an Biomedical engineer and lives in a household with wife. Family status is married , with 2 children. Cats as pets.  Daughters are home schooled.  The patient currently works full time.   Tobacco use: none .   ETOH use ; no heavy drinking - and no ETOH in 2 years., Caffeine intake in form of Coffee( /) Soda( 4) Tea ( 3/ week ) or energy drinks Exercise in form of walking .        Sleep habits are as follows: The patient's dinner time is between 5-8 PM. The patient goes to bed at 12 PM and continues to sleep for 6 hours,  fragmented , wakes for 1-2 bathroom breaks, the first time at 2-3 AM.   The preferred sleep position is supine and lateral , with the support of 2 pillows. Dreams are reportedly frequent and /vivid.   The patient wakes up spontaneously 7 AM, 8   AM is the usual rise time. He/ She reports not feeling refreshed or restored in AM, with symptoms such as dry mouth, rare morning headaches, and residual fatigue.  Naps are taken infrequently, feels not refreshed   Review of Systems: Out of a complete 14 system review, the patient complains of only the following symptoms, and all other reviewed systems are negative.:  Fatigue, sleepiness , snoring, fragmented sleep, Insomnia   How likely are you to doze in the following situations: 0 = not likely, 1 = slight chance, 2 = moderate chance, 3 = high chance  Sitting and Reading? Watching Television? Sitting inactive in a public place (theater or meeting)? As a passenger in a car for an hour without a break? Lying down in the afternoon when circumstances permit? Sitting and talking to someone? Sitting quietly after lunch without alcohol? In a car, while stopped for a few minutes in traffic?   Total = 9/ 24 points   FSS endorsed at 40/ 63 points.   Social History   Socioeconomic History   Marital status: Married    Spouse name: Not on file   Number of children: 2   Years of education: Not on file   Highest education level: Some college, no degree  Occupational History   Occupation: [employer]  Tobacco Use   Smoking status: Never   Smokeless tobacco: Never  Vaping Use   Vaping status: Never Used  Substance and Sexual  Activity   Alcohol use: Not Currently   Drug use: Never   Sexual activity: Not on file  Other Topics Concern   Not on file  Social History Narrative   Married, 2 daughters.    Occupation: Works for [employer] family [employer].   No tobacco, alcohol, or drugs.   Caffiene green tea 2 cups a week   Social Drivers of Corporate investment banker Strain: Low Risk  (12/29/2023)   Overall Financial Resource Strain (CARDIA)    Difficulty of Paying Living Expenses: Not very hard  Food Insecurity: Food Insecurity Present (12/29/2023)   Hunger Vital Sign    Worried About Running Out of Food in the Last Year: Sometimes true    Ran Out of Food in the Last Year: Never true  Transportation Needs: No Transportation Needs (12/29/2023)   PRAPARE - Administrator, Civil Service (Medical): No    Lack of Transportation (Non-Medical): No  Physical Activity: Insufficiently Active (12/29/2023)   Exercise Vital Sign    Days of Exercise per Week: 3 days    Minutes of Exercise per Session: 40 min  Stress: No Stress Concern Present (12/29/2023)   [employer] of Occupational Health - Occupational Stress Questionnaire    Feeling of Stress : Only a little  Social Connections: Moderately Integrated (12/29/2023)   Social Connection and Isolation Panel [NHANES]    Frequency of Communication with Friends and Family: Twice a week    Frequency of Social Gatherings with Friends and Family: Once a week    Attends Religious Services: 1 to 4 times per year    Active Member of [employer] or Organizations: No    Attends Engineer, structural: Not on file    Marital Status: Married    Family History  Problem Relation Age of Onset   Rectal cancer Mother    Cancer Mother 44       Cholangiocarcinoma   Heart disease Father 75       CABG   Thyroid  disease Father    Heart disease Maternal Grandmother    Heart attack Maternal Grandfather    Crohn's disease Paternal Grandmother    Stroke Paternal  Grandfather    Heart failure Paternal Grandfather    Diabetes Paternal Aunt    Diabetes Paternal Uncle    Lactose intolerance Daughter     Past Medical History:  Diagnosis Date   ADHD    Bipolar 1 disorder (HCC)    CAD (coronary artery disease)    Concussion    history of concussion's 10-13 (football, 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  Iron normal   Ruptured plantar fascia    left, 2022    Past Surgical History:  Procedure Laterality Date   CORONARY STENT INTERVENTION N/A 09/14/2023   Procedure: CORONARY STENT INTERVENTION;  Surgeon: Knox Perl, MD;  Location: [facility] INVASIVE CV LAB;  Service: Cardiovascular;  Laterality: N/A;   EXTERNAL EAR SURGERY Right    trauma (bit off)   LEFT HEART CATH AND CORONARY ANGIOGRAPHY N/A 09/14/2023   Procedure: LEFT HEART CATH AND CORONARY ANGIOGRAPHY;  Surgeon: Knox Perl, MD;  Location: [facility] INVASIVE CV LAB;  Service: Cardiovascular;  Laterality: N/A;   LEFT HEART CATH AND CORONARY ANGIOGRAPHY N/A 09/22/2023   Procedure: LEFT HEART CATH AND CORONARY ANGIOGRAPHY;  Surgeon: Arnoldo Lapping, MD;  [facility];  Service: Cardiovascular;  Laterality: N/A;     Current Outpatient Medications on File Prior to Visit  Medication Sig Dispense Refill   ASPIRIN  LOW DOSE 81 MG chewable tablet chew ONE TABLET BY MOUTH ONCE DAILY 90 tablet 1   atomoxetine  (STRATTERA ) 80 MG capsule Take 80 mg by mouth daily.     CAPLYTA  42 MG capsule Take 42 mg by mouth at bedtime.     clopidogrel  (PLAVIX ) 75 MG tablet TAKE ONE TABLET BY MOUTH EVERY DAY WITH BREAKFAST 90 tablet 3   lamoTRIgine  (LAMICTAL ) 200 MG tablet Take 200 mg by mouth daily.     metoprolol  tartrate (LOPRESSOR ) 25 MG tablet Take 1  tablet (25 mg total) by mouth 2 (two) times daily. 180 tablet 3   Misc Natural Products (AIRBORNE ELDERBERRY) 100-50 MG CHEW Chew 2 tablets by mouth daily.     nitroGLYCERIN  (NITROSTAT ) 0.4 MG SL tablet Place 1 tablet (0.4 mg total) under the tongue every 5 (five) minutes x 3 doses as needed for chest pain. 25 tablet 1   Omega Fatty Acids-Vitamins (OMEGA-3 GUMMIES) CHEW Chew 2 tablets by mouth daily. VitaFusion     rosuvastatin  (CRESTOR ) 40 MG tablet TAKE ONE TABLET BY MOUTH EVERY DAY 90 tablet 3   Resmetirom  (REZDIFFRA ) 100 MG TABS Take 1 tablet by mouth daily. (Patient not taking: Reported on 04/28/2024)     No current facility-administered medications on file prior to visit.    No Known Allergies   DIAGNOSTIC DATA (LABS, IMAGING, TESTING) - I reviewed patient records, labs, notes, testing and imaging myself where available.  Lab Results  Component Value Date   WBC 4.1 11/17/2023   HGB 16.3 11/17/2023   HCT 47.6 11/17/2023   MCV 90.8 11/17/2023   PLT 183 11/17/2023      Component Value Date/Time   NA 142 12/08/2023 0924   K 3.9 12/08/2023 0924   CL 104 12/08/2023 0924   CO2 24 12/08/2023 0924   GLUCOSE 65 (L) 12/08/2023 0924   GLUCOSE 122 (H) 11/17/2023 1322   BUN 11 12/08/2023 0924   CREATININE 1.03 12/08/2023 0924   CREATININE 1.03 09/14/2020 1535   CALCIUM  9.4 12/08/2023 0924   PROT 6.6 12/08/2023 0924   ALBUMIN 4.6 12/08/2023 0924   AST 35 12/08/2023 0924   AST 55 (H) 09/14/2020 1535   ALT 35 12/08/2023 0924   ALT 74 (H) 09/14/2020 1535   ALKPHOS 59 12/08/2023 0924   BILITOT 0.4 12/08/2023 2956  BILITOT 0.7 09/14/2020 1535   GFRNONAA >60 11/17/2023 1322   GFRNONAA >60 09/14/2020 1535   Lab Results  Component Value Date   CHOL 110 12/08/2023   HDL 41 12/08/2023   LDLCALC 42 12/08/2023   TRIG 164 (H) 12/08/2023   CHOLHDL 2.7 12/08/2023   Lab Results  Component Value Date   HGBA1C 5.4 12/08/2023   Lab Results  Component Value Date   VITAMINB12 390  09/14/2020   Lab Results  Component Value Date   TSH 1.31 03/14/2024    PHYSICAL EXAM:  Today's Vitals   04/28/24 1523  BP: 100/66  Pulse: 68  Weight: 220 lb (99.8 kg)  Height: 6\' 2"  (1.88 m)   Body mass index is 28.25 kg/m.   Wt Readings from Last 3 Encounters:  04/28/24 220 lb (99.8 kg)  03/29/24 224 lb 9.6 oz (101.9 kg)  03/14/24 224 lb 4 oz (101.7 kg)     Ht Readings from Last 3 Encounters:  04/28/24 6\' 2"  (1.88 m)  03/29/24 6\' 2"  (1.88 m)  03/14/24 6\' 2"  (1.88 m)      General: The patient is awake, alert and appears not in acute distress. The patient is well groomed. Head: Normocephalic, atraumatic. Neck is supple.  Mallampati 3,  neck circumference:17.5 inches . Nasal airflow  patent.  Retrognathia is not seen.  Dental status: biological  Cardiovascular:  Regular rate and cardiac rhythm by pulse,  without distended neck veins. Respiratory: Lungs are clear to auscultation.  Skin:  Without evidence of ankle edema, or rash. Trunk: The patient's posture is erect.   NEUROLOGIC EXAM: The patient is awake and alert, oriented to place and time.   Memory subjective described as intact.  Attention span & concentration ability appears normal.  Speech is fluent,  without  dysarthria, dysphonia or aphasia.  Mood and affect are appropriate.   Cranial nerves: no loss of smell or taste reported  Pupils are equal and briskly reactive to light. Funduscopic exam deferred.  Extraocular movements in vertical and horizontal planes were intact and without nystagmus. No Diplopia. Blurred vision, presbyopia.  Visual fields by finger perimetry are intact. Hearing was intact to soft voice and finger rubbing.   Facial sensation intact to fine touch.  Facial motor strength is symmetric and tongue and uvula move midline.  Neck ROM : rotation, tilt and flexion extension were normal for age and shoulder shrug was symmetrical.    Motor exam:  Symmetric bulk, tone and ROM.   Normal tone  without cog wheeling, symmetric grip strength .   Sensory: deferred.  Coordination: Rapid alternating movements in the fingers/hands were of normal speed.  The Finger-to-nose maneuver was intact without evidence of ataxia, dysmetria or tremor.   Gait and station: Patient could rise unassisted from a seated position, walked without assistive device.  Deep tendon reflexes: in the  upper and lower extremities are symmetric and intact.  Babinski response was deferred.    ASSESSMENT AND PLAN :  48 y.o. year old male  here with:  Ruling out OSA for cause of sleepiness, fatigue.   Also vivid night terrors , not treated.     1) recent Myocardial Infarct at age 54, and constantly feeling fatigued, non refreshed.  Snoring, and waking with a dry mouth.   2) Has low BP currently  on medication  beta blocker , Eloquis. 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 Was 40 pounds heavier at the time. Now BMI 28,  4) multiple concussions, TBI - can affect memory. He is already in the work up for that.     Plan :  start 3 mg of melatonin. HST to screen for apnea.  RV in 4-6 months after HST .  I plan to follow up either personally or through our NP..   I would like to thank McGowen, Minetta Aly, MD and Glory Larsen, Md [address] [place],  [place] [address] for allowing me to meet with and to take care of this pleasant patient.    After spending a total time of  35  minutes face to face and additional time for physical and neurologic examination, review of laboratory studies,  personal review of imaging studies, reports and results of other testing and review of referral information / records as far as provided in visit,   Electronically signed by: Neomia Banner, MD 04/28/2024 3:45 PM  [facility] and [facility] Sleep Board certified by The [employer] of Sleep Medicine and Diplomate of the [employer] of Sleep Medicine. Board certified In Neurology  through the ABPN, Fellow of the [employer] of Neurology.

## 2024-04-28 NOTE — Patient Instructions (Addendum)
 Hypersomnia Hypersomnia is a condition in which a person feels very tired during the day even though the person gets plenty of sleep at night. A person with this condition may take naps during the day and may find it very difficult to wake up from sleep. Hypersomnia may affect a person's ability to think, concentrate, drive, or remember things. What are the causes? The cause of this condition may not be known. Possible causes include: Taking certain medicines. Using drugs or alcohol. Sleep disorders, such as narcolepsy and sleep apnea. Injury to the head, brain, or spinal cord. Tumors. Certain medical conditions. These include: Depression. Diabetes. Gastroesophageal reflux disease (GERD). An underactive thyroid  gland (hypothyroidism). What are the signs or symptoms? The main symptoms of hypersomnia include: Feeling very tired throughout the day, regardless of how much sleep you got the night before. Having trouble waking up. Others may find it difficult to wake you up when you are sleeping. Sleeping for longer and longer periods at a time. Taking naps throughout the day. Other symptoms may include: Feeling restless, anxious, or annoyed. Lacking energy. Having trouble with: Remembering. Speaking. Thinking. Loss of appetite. Seeing, hearing, tasting, smelling, or feeling things that are not real (hallucinations). How is this diagnosed? This condition may be diagnosed based on: Your symptoms and medical history. Your sleeping habits. Your health care provider may ask you to write down your sleeping habits in a daily sleep log, along with any symptoms you have. A series of tests that are done while you sleep (sleep study or polysomnogram). A test that measures how quickly you can fall asleep during the day (daytime nap study or multiple sleep latency test). How is this treated? This condition may be treated by: Following a regular sleep routine. Making lifestyle  changes, such as changing your eating habits, getting regular exercise, and avoiding alcohol or caffeinated beverages. Taking medicines to make you more alert (stimulants) during the day. Treating any underlying medical causes of hypersomnia. Follow these instructions at home: Sleep habits Stick to a routine that includes going to bed and waking up at the same times every day and night. Practice a relaxing bedtime routine. This may include reading, meditation, deep breathing, or taking a warm bath before going to sleep. Exercise regularly as told by your health care provider. However, avoid exercising in the hours right before bedtime. Keep your sleep environment at a cooler temperature, darkened, and quiet. Sleep with pillows and a mattress that are comfortable and supportive. Schedule short 20-minute naps for when you feel sleepiest during the day. Talk with your employer or teachers about your hypersomnia. If possible, adjust your schedule so that: You have a regular daytime work schedule. You can take a scheduled nap during the day. You do not have to work or be active at night. Do not eat a heavy meal for a few hours before bedtime. Eat your meals at about the same times every day. Safety  Do not drive or use machinery if you are sleepy. Ask your health care provider if it is safe for you to drive. Wear a life jacket when swimming or spending time near water. General instructions  Take over-the-counter and prescription medicines only as told by your health care provider. This includes supplements. Avoid drinking alcohol or caffeinated beverages. Keep a sleep log that will help your health care provider manage your condition. This may include information about: What time you go to bed each night. How often you wake  up at night. How many hours you sleep at night. How often and for how long you nap during the day. Any observations from others, such as leg movements during sleep, sleep  walking, or snoring. Keep all follow-up visits. This is important. Contact a health care provider if: You have new symptoms. Your symptoms get worse. Get help right away if: You have thoughts about hurting yourself or someone else. Get help right away if you feel like you may hurt yourself or others, or have thoughts about taking your own life. Go to your nearest emergency room or: Call 911. Call the National Suicide Prevention Lifeline at 239-448-3725 or 988. This is open 24 hours a day. Text the Crisis Text Line at 812-512-3969. Summary Hypersomnia refers to a condition in which you feel very tired during the day even though you get plenty of sleep at night. A person with this condition may take naps during the day and may find it very difficult to wake up from sleep. Hypersomnia may affect a person's ability to think, concentrate, drive, or remember things. Treatment may include a regular sleep routine and making some lifestyle changes. This information is not intended to replace advice given to you by your health care provider. Make sure you discuss any questions you have with your health care provider. Document Revised: 10/28/2021 Document Reviewed: 10/28/2021 Elsevier Patient Education  2024 Elsevier Inc.ASSESSMENT AND PLAN 47 y.o. year old male  here with:  Ruling out OSA for cause of sleepiness, fatigue.   Also vivid night terrors , not treated.     1) recent Myocardial infarct at age 89, and constantly feeling fatigued, non refreshed.  Snoring, and waking with a dry mouth.   2) Has low BP currently  on medication  beta blocker , Eloquis. 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 Was 40 pounds heavier at the time. Now BMI 28,   4) multiple concussions, TBI - can affect memory. He is already in the work up for that.     Plan :  start 3 mg of melatonin. HST to screen for apnea.  RV in 4-6 months after HST .  I plan to follow up either personally or through our NP..   I  would like to thank McGowen, Minetta Aly, MD and Glory Larsen, Md [address] [place],  [place] [address] for allowing me to meet with and to take care of this pleasant patient.    After spending a total time of  35  minutes face to face and additional time for physical and neurologic examination, review of laboratory studies,  personal review of imaging studies, reports and results of other testing and review of referral information / records as far as provided in visit,   Electronically signed by: Neomia Banner, MD 04/28/2024 3:45 PM  [facility] and [facility] Sleep Board certified by The [employer] of Sleep Medicine and Diplomate of the [employer] of Sleep Medicine. Board certified In Neurology through the ABPN, Fellow of the [employer] of Neurology.

## 2024-04-28 NOTE — Telephone Encounter (Signed)
 Patient is returning a call to John Day. Patient is requesting a call back. Please advise.

## 2024-04-29 ENCOUNTER — Encounter: Payer: Self-pay | Admitting: Neurology

## 2024-05-03 ENCOUNTER — Ambulatory Visit
Admission: RE | Admit: 2024-05-03 | Discharge: 2024-05-03 | Disposition: A | Discharge status: Home / Self Care | Source: Ambulatory Visit | Attending: Neurology

## 2024-05-03 DIAGNOSIS — R29898 Other symptoms and signs involving the musculoskeletal system: Secondary | ICD-10-CM

## 2024-05-03 DIAGNOSIS — R200 Anesthesia of skin: Secondary | ICD-10-CM

## 2024-05-03 DIAGNOSIS — R519 Headache, unspecified: Secondary | ICD-10-CM

## 2024-05-03 DIAGNOSIS — R42 Dizziness and giddiness: Secondary | ICD-10-CM

## 2024-05-03 DIAGNOSIS — S0990XA Unspecified injury of head, initial encounter: Secondary | ICD-10-CM

## 2024-05-03 DIAGNOSIS — R4189 Other symptoms and signs involving cognitive functions and awareness: Secondary | ICD-10-CM

## 2024-05-03 DIAGNOSIS — R29810 Facial weakness: Secondary | ICD-10-CM

## 2024-05-03 DIAGNOSIS — Z87820 Personal history of traumatic brain injury: Secondary | ICD-10-CM

## 2024-05-03 DIAGNOSIS — R2689 Other abnormalities of gait and mobility: Secondary | ICD-10-CM

## 2024-05-03 DIAGNOSIS — H539 Unspecified visual disturbance: Secondary | ICD-10-CM

## 2024-05-03 MED ORDER — GADOPICLENOL 0.5 MMOL/ML IV SOLN
10.0000 mL | Freq: Once | INTRAVENOUS | Status: AC | PRN
  Administered 2024-05-03: 10 mL via INTRAVENOUS

## 2024-05-03 NOTE — Progress Notes (Signed)
 HPI: Follow-up coronary artery disease.  Previously followed by Dr. Alroy Aspen but transitioning to me.  Patient admitted with non-ST elevation myocardial infarction October 2024.  Cardiac catheterization revealed occluded RCA.  Patient had PCI of the right coronary artery.  Follow-up catheterization 8 days later showed widely patent RCA stent with no stenosis and otherwise no obstructive disease.  Monitor January 2025 showed sinus rhythm with short episodes of nonsustained SVT, rare PVC and PAC.  Echocardiogram March 2025 showed normal LV function, mild mitral regurgitation, trace aortic insufficiency and mildly dilated ascending aorta at 43 mm.  Since last seen patient denies dyspnea, chest pain, palpitations or syncope.  Current Outpatient Medications  Medication Sig Dispense Refill   ASPIRIN  LOW DOSE 81 MG chewable tablet chew ONE TABLET BY MOUTH ONCE DAILY 90 tablet 1   atomoxetine  (STRATTERA ) 80 MG capsule Take 80 mg by mouth daily.     CAPLYTA  42 MG capsule Take 42 mg by mouth at bedtime.     clopidogrel  (PLAVIX ) 75 MG tablet TAKE ONE TABLET BY MOUTH EVERY DAY WITH BREAKFAST 90 tablet 3   lamoTRIgine  (LAMICTAL ) 200 MG tablet Take 200 mg by mouth daily.     metoprolol  tartrate (LOPRESSOR ) 25 MG tablet Take 1 tablet (25 mg total) by mouth 2 (two) times daily. 180 tablet 3   Misc Natural Products (AIRBORNE ELDERBERRY) 100-50 MG CHEW Chew 2 tablets by mouth daily.     nitroGLYCERIN  (NITROSTAT ) 0.4 MG SL tablet Place 1 tablet (0.4 mg total) under the tongue every 5 (five) minutes x 3 doses as needed for chest pain. 25 tablet 1   Omega Fatty Acids-Vitamins (OMEGA-3 GUMMIES) CHEW Chew 2 tablets by mouth daily. VitaFusion     rosuvastatin  (CRESTOR ) 40 MG tablet TAKE ONE TABLET BY MOUTH EVERY DAY 90 tablet 3   Resmetirom  (REZDIFFRA ) 80 MG TABS Take 1 tablet by mouth daily. Sent to [facility] Patient Support fax [phone]; phone [phone] (Patient not taking: Reported on 05/17/2024)     No  current facility-administered medications for this visit.     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  Iron normal   Ruptured plantar fascia    left, 2022    Past Surgical History:  Procedure Laterality Date   CORONARY STENT INTERVENTION N/A 09/14/2023   Procedure: CORONARY STENT INTERVENTION;  Surgeon: Knox Perl, MD;  Location: [facility] INVASIVE CV LAB;  Service: Cardiovascular;  Laterality: N/A;   EXTERNAL EAR SURGERY Right    trauma (bit off)   LEFT HEART CATH AND CORONARY ANGIOGRAPHY N/A 09/14/2023   Procedure: LEFT HEART CATH AND CORONARY ANGIOGRAPHY;  Surgeon: Knox Perl, MD;  Location: [facility] INVASIVE CV LAB;  Service: Cardiovascular;  Laterality: N/A;   LEFT HEART CATH AND CORONARY ANGIOGRAPHY N/A 09/22/2023   Procedure: LEFT HEART CATH AND CORONARY ANGIOGRAPHY;  Surgeon: Arnoldo Lapping, MD;  Location: [facility] INVASIVE CV LAB;  Service: Cardiovascular;  Laterality: N/A;    Social History   Socioeconomic History   Marital status: Married    Spouse name: Not on file  Number of children: 2   Years of education: Not on file   Highest education level: Some college, no degree  Occupational History   Occupation: [employer]  Tobacco Use   Smoking status: Never   Smokeless tobacco: Never  Vaping Use   Vaping status: Never Used  Substance and Sexual Activity   Alcohol use: Not Currently   Drug use: Never   Sexual activity: Not on file  Other Topics Concern   Not on file  Social History Narrative   Married, 2 daughters.    Occupation: Works for [employer]  family heritage.   No tobacco, alcohol, or drugs.   Caffiene green tea 2 cups a week   Social Drivers of Corporate investment banker Strain: Low Risk  (12/29/2023)   Overall Financial Resource Strain (CARDIA)    Difficulty of Paying Living Expenses: Not very hard  Food Insecurity: Food Insecurity Present (12/29/2023)   Hunger Vital Sign    Worried About Running Out of Food in the Last Year: Sometimes true    Ran Out of Food in the Last Year: Never true  Transportation Needs: No Transportation Needs (12/29/2023)   PRAPARE - Administrator, Civil Service (Medical): No    Lack of Transportation (Non-Medical): No  Physical Activity: Insufficiently Active (12/29/2023)   Exercise Vital Sign    Days of Exercise per Week: 3 days    Minutes of Exercise per Session: 40 min  Stress: No Stress Concern Present (12/29/2023)   [employer] of Occupational Health - Occupational Stress Questionnaire    Feeling of Stress : Only a little  Social Connections: Moderately Integrated (12/29/2023)   Social Connection and Isolation Panel    Frequency of Communication with Friends and Family: Twice a week    Frequency of Social Gatherings with Friends and Family: Once a week    Attends Religious Services: 1 to 4 times per year    Active Member of [employer] or Organizations: No    Attends Engineer, structural: Not on file    Marital Status: Married  Catering manager Violence: Not At Risk (09/22/2023)   Humiliation, Afraid, Rape, and Kick questionnaire    Fear of Current or Ex-Partner: No    Emotionally Abused: No    Physically Abused: No    Sexually Abused: No    Family History  Problem Relation Age of Onset   Rectal cancer Mother    Cancer Mother 73       Cholangiocarcinoma   Heart disease Father 81       CABG   Thyroid  disease Father    Heart disease Maternal Grandmother    Heart attack Maternal Grandfather    Crohn's disease Paternal Grandmother    Stroke Paternal  Grandfather    Heart failure Paternal Grandfather    Diabetes Paternal Aunt    Diabetes Paternal Uncle    Lactose intolerance Daughter     ROS: no fevers or chills, productive cough, hemoptysis, dysphasia, odynophagia, melena, hematochezia, dysuria, hematuria, rash, seizure activity, orthopnea, PND, pedal edema, claudication. Remaining systems are negative.  Physical Exam: Well-developed well-nourished in no acute distress.  Skin is warm and dry.  HEENT is normal.  Neck is supple.  Chest is clear to auscultation with normal expansion.  Cardiovascular exam is regular rate and rhythm.  Abdominal exam nontender or distended. No masses palpated. Extremities show no edema. neuro grossly intact   A/P  1 coronary artery disease-continue aspirin , Plavix  and statin.  Patient denies recurrent chest pain.  Will discontinue Plavix  November of this year.  2 hyperlipidemia-continue statin.  3 dilated aortic root-noted on echocardiogram.  Plan follow-up CTA March 2026.  Alexandria Angel, MD

## 2024-05-04 ENCOUNTER — Ambulatory Visit: Payer: Self-pay | Admitting: Neurology

## 2024-05-05 ENCOUNTER — Telehealth: Payer: Self-pay | Admitting: Neurology

## 2024-05-05 MED ORDER — REZDIFFRA 80 MG PO TABS: 1.0000 | ORAL_TABLET | Freq: Every day | ORAL | Status: DC

## 2024-05-05 NOTE — Telephone Encounter (Signed)
HST [facility] [employer] pending

## 2024-05-05 NOTE — Telephone Encounter (Signed)
 Pharmacist, Maureen Sour from [facility] [employer] (phone [phone]) called to advise that patient is taking Plavix  and the recommendation for Rezdiffra  with a patient taking Plavix  is to decrease the Rezdiffra  dosage to 80 mg tablet daily.  Updated rx provided to [facility], Rezdiffra  80 mg tablet, take 1 tablet by mouth once daily #30 8 refills.

## 2024-05-05 NOTE — Addendum Note (Signed)
 Addended by: Glennette Lanius on: 05/05/2024 10:35 AM   Modules accepted: Orders

## 2024-05-06 ENCOUNTER — Ambulatory Visit: Payer: Medicaid Other | Admitting: Physician Assistant

## 2024-05-16 NOTE — Telephone Encounter (Signed)
 HST [facility] [employer] [place] [employer]: [phone] (exp. 05/05/24 to 08/05/24)

## 2024-05-17 ENCOUNTER — Ambulatory Visit: Payer: Medicaid Other | Attending: Cardiology | Admitting: Cardiology

## 2024-05-17 ENCOUNTER — Encounter: Payer: Self-pay | Admitting: Cardiology

## 2024-05-17 VITALS — BP 92/64 | HR 55 | Ht 74.0 in | Wt 216.0 lb

## 2024-05-17 DIAGNOSIS — E7800 Pure hypercholesterolemia, unspecified: Secondary | ICD-10-CM | POA: Insufficient documentation

## 2024-05-17 DIAGNOSIS — I77810 Thoracic aortic ectasia: Secondary | ICD-10-CM | POA: Insufficient documentation

## 2024-05-17 DIAGNOSIS — I2510 Atherosclerotic heart disease of native coronary artery without angina pectoris: Secondary | ICD-10-CM | POA: Diagnosis not present

## 2024-05-17 NOTE — Patient Instructions (Signed)
 Medication Instructions:   STOP PLAVIX  NOVEMBER 1 ST  *If you need a refill on your cardiac medications before your next appointment, please call your pharmacy*  Follow-Up: At [facility], you and your health needs are our priority.  As part of our continuing mission to provide you with exceptional heart care, our providers are all part of one team.  This team includes your primary Cardiologist (physician) and Advanced Practice Providers or APPs (Physician Assistants and Nurse Practitioners) who all work together to provide you with the care you need, when you need it.  Your next appointment:   12 month(s)  Provider:   Alexandria Angel MD

## 2024-06-06 ENCOUNTER — Emergency Department (HOSPITAL_BASED_OUTPATIENT_CLINIC_OR_DEPARTMENT_OTHER)
Admission: EM | Admit: 2024-06-06 | Discharge: 2024-06-06 | Disposition: A | Discharge status: Home / Self Care | Attending: Student | Admitting: Student

## 2024-06-06 ENCOUNTER — Encounter (HOSPITAL_BASED_OUTPATIENT_CLINIC_OR_DEPARTMENT_OTHER): Payer: Self-pay

## 2024-06-06 ENCOUNTER — Telehealth: Payer: Self-pay | Admitting: Gastroenterology

## 2024-06-06 ENCOUNTER — Other Ambulatory Visit: Payer: Self-pay

## 2024-06-06 DIAGNOSIS — Z7982 Long term (current) use of aspirin: Secondary | ICD-10-CM | POA: Insufficient documentation

## 2024-06-06 DIAGNOSIS — M549 Dorsalgia, unspecified: Secondary | ICD-10-CM

## 2024-06-06 DIAGNOSIS — Z7902 Long term (current) use of antithrombotics/antiplatelets: Secondary | ICD-10-CM | POA: Insufficient documentation

## 2024-06-06 DIAGNOSIS — M545 Low back pain, unspecified: Secondary | ICD-10-CM | POA: Diagnosis present

## 2024-06-06 MED ORDER — PREDNISONE 10 MG PO TABS: 20.0000 mg | ORAL_TABLET | Freq: Every day | ORAL | 0 refills | Status: AC

## 2024-06-06 MED ORDER — PREDNISONE 20 MG PO TABS
40.0000 mg | ORAL_TABLET | Freq: Once | ORAL | Status: AC
  Administered 2024-06-06: 40 mg via ORAL
  Filled 2024-06-06: qty 2

## 2024-06-06 MED ORDER — OXYCODONE HCL 5 MG PO TABS
5.0000 mg | ORAL_TABLET | Freq: Once | ORAL | Status: DC
  Filled 2024-06-06: qty 1

## 2024-06-06 MED ORDER — CYCLOBENZAPRINE HCL 10 MG PO TABS: 10.0000 mg | ORAL_TABLET | Freq: Two times a day (BID) | ORAL | 0 refills | Status: DC | PRN

## 2024-06-06 MED ORDER — HYDROCODONE-ACETAMINOPHEN 5-325 MG PO TABS: 1.0000 | ORAL_TABLET | Freq: Four times a day (QID) | ORAL | 0 refills | Status: DC | PRN

## 2024-06-06 NOTE — Telephone Encounter (Signed)
 I have spoken to patient who has scheduled an appointment with Dr Stacia for 08/02/24 at 850 am to discuss alternative treatment possibilities in place of Rezdiffra .

## 2024-06-06 NOTE — ED Provider Notes (Signed)
 [facility] EMERGENCY DEPARTMENT AT [facility] Provider Note   CSN: [id number] Arrival date & time: 06/06/24  1436     Patient presents with: Back Pain  HPI Billy Andersen is a 48 y.o. male presenting for back pain.  Started this past Friday.  He states he was at the beach and he was picking up a seashell and immediately when he stood back up he had excruciating right lower back pain.  It is worse with movement.  Still ambulatory and able to bear weight.  Denies any other trauma to the back.  Denies urinary or bowel changes, fever, IV drug use, saddle anesthesia.  Reports history of NSTEMI last year, on Plavix  and aspirin , also history of ascending aortic dilation.    Back Pain      Prior to Admission medications   Medication Sig Start Date End Date Taking? Authorizing Provider  cyclobenzaprine  (FLEXERIL ) 10 MG tablet Take 1 tablet (10 mg total) by mouth 2 (two) times daily as needed for muscle spasms. 06/06/24  Yes Labrisha Wuellner K, [name]  HYDROcodone -acetaminophen  (NORCO/VICODIN) 5-325 MG tablet Take 1 tablet by mouth every 6 (six) hours as needed. 06/06/24  Yes Edithe Dobbin K, [name]  predniSONE  (DELTASONE ) 10 MG tablet Take 2 tablets (20 mg total) by mouth daily for 5 days. 06/06/24 06/11/24 Yes Lang Norleen POUR, [name]  ASPIRIN  LOW DOSE 81 MG chewable tablet chew ONE TABLET BY MOUTH ONCE DAILY 04/13/24   Nahser, Aleene PARAS, MD  atomoxetine  (STRATTERA ) 80 MG capsule Take 80 mg by mouth daily. 09/01/23   [provider]  CAPLYTA  42 MG capsule Take 42 mg by mouth at bedtime. 09/01/23   [provider]  clopidogrel  (PLAVIX ) 75 MG tablet TAKE ONE TABLET BY MOUTH EVERY DAY WITH BREAKFAST 03/01/24   Nahser, Aleene PARAS, MD  lamoTRIgine  (LAMICTAL ) 200 MG tablet Take 200 mg by mouth daily. 11/23/23   [provider]  metoprolol  tartrate (LOPRESSOR ) 25 MG tablet Take 1 tablet (25 mg total) by mouth 2 (two) times daily. 01/04/24   Nahser, Aleene PARAS, MD  Misc Natural  Products (AIRBORNE ELDERBERRY) 100-50 MG CHEW Chew 2 tablets by mouth daily.    [provider]  nitroGLYCERIN  (NITROSTAT ) 0.4 MG SL tablet Place 1 tablet (0.4 mg total) under the tongue every 5 (five) minutes x 3 doses as needed for chest pain. 09/14/23   Ladona Heinz, MD  Omega Fatty Acids-Vitamins (OMEGA-3 GUMMIES) CHEW Chew 2 tablets by mouth daily. VitaFusion    [provider]  Resmetirom  (REZDIFFRA ) 80 MG TABS Take 1 tablet by mouth daily. Sent to [facility] Patient Support fax [phone]; phone [phone] Patient not taking: Reported on 05/17/2024 05/05/24   Mollie Nestor HERO, [name]  rosuvastatin  (CRESTOR ) 40 MG tablet TAKE ONE TABLET BY MOUTH EVERY DAY 11/03/23   Lelon Hamilton T, [name]    Allergies: Patient has no known allergies.    Review of Systems  Musculoskeletal:  Positive for back pain.    Updated Vital Signs BP 108/73 (BP Location: Right Arm)   Pulse 78   Temp 97.9 F (36.6 C)   Resp 16   SpO2 100%   Physical Exam Constitutional:      Appearance: Normal appearance.  HENT:     Head: Normocephalic.     Nose: Nose normal.  Eyes:     Conjunctiva/sclera: Conjunctivae normal.  Pulmonary:     Effort: Pulmonary effort is normal.  Abdominal:     Tenderness: There is no abdominal tenderness. There  is no right CVA tenderness or left CVA tenderness.  Musculoskeletal:     Comments: No tenderness with palpation of the lower back including the midline.  No erythema, edema or ecchymosis noted.  Tenderness elicited with active flexion extension and rotation of the back.  Pedal pulses are 2+ bilaterally.  Cap refill is brisk distally.  Neurological:     Mental Status: He is alert.  Psychiatric:        Mood and Affect: Mood normal.     (all labs ordered are listed, but only abnormal results are displayed) Labs Reviewed - No data to display  EKG: None  Radiology: No results found.   Procedures   Medications Ordered in the ED  predniSONE  (DELTASONE )  tablet 40 mg (has no administration in time range)  oxyCODONE  (Oxy IR/ROXICODONE ) immediate release tablet 5 mg (has no administration in time range)                                    Medical Decision Making  48 year old well-appearing male presenting for back pain.  Exam notable for right lower back tenderness with active movement.  Suspect this is a soft tissue injury.  Considered more sinister pathology for his back pain but unlikely given lack of red flag symptoms.  Also considered UTI or renal pathology but unlikely given no urinary changes and lack of symptoms.  Advised conservative management at home.  Treated here with oxycodone  and prednisone .  Advised a 5-day course of prednisone  and to follow-up with his PCP.  Also sent Flexeril  to his pharmacy at his request. Also given that NSAIDs are contraindicated given his dual antiplatelet therapy, we will send him home with a few tablets of Norco for the next couple days.  Discussed return precautions.  Discharged good condition.     Final diagnoses:  Acute right-sided back pain, unspecified back location    ED Discharge Orders          Ordered    predniSONE  (DELTASONE ) 10 MG tablet  Daily        06/06/24 1804    HYDROcodone -acetaminophen  (NORCO/VICODIN) 5-325 MG tablet  Every 6 hours PRN        06/06/24 1805    cyclobenzaprine  (FLEXERIL ) 10 MG tablet  2 times daily PRN        06/06/24 1806               Lang Norleen POUR, [name] 06/06/24 1807    Kommor, Lum, MD 06/07/24 1729

## 2024-06-06 NOTE — Discharge Instructions (Addendum)
 Evaluation today was reassuring.  Suspect this is a soft tissue injury in the lower back likely a muscle strain or tear.  Please follow-up with your PCP.  I sent steroids, Norco for pain and Flexeril  to your pharmacy to treat.  Also as we discussed please treat conservatively with ice and gentle stretching as tolerated.  Apply ice 3-4 times a day for 15 to 20 minutes each time.  If you develop urinary or bowel incontinence or retention, saddle numbness, fever, new trauma or any other concerning symptom please return to the ED for further evaluation.

## 2024-06-06 NOTE — Telephone Encounter (Signed)
 Inbound call from patient stating he does not wish to take Resmetirom  medication due to possible side effects. Patient is requesting to discuss possible alternatives. Please advise, thank you.

## 2024-06-06 NOTE — ED Notes (Signed)
 RN reviewed discharge instructions with pt. Pt verbalized understanding and had no further questions. VSS upon discharge.

## 2024-06-06 NOTE — ED Triage Notes (Signed)
 Pt c/o back injury to RL back Friday, continued dull pain w worsening pain on movement. Tylenol  PTA, states PCP recommended MRI or US 

## 2024-06-06 NOTE — Telephone Encounter (Signed)
 Dr C-  Patient was rx'ed rezdiffra  (by Nestor Blower, [name]) but has decided against this due to possible side effects. He wants to discuss other treatment options. He was previously established with Stephane Quest at [facility] 2022. Does he need to discuss other options with [facility] or would you like him to set up follow up with you?

## 2024-06-07 ENCOUNTER — Inpatient Hospital Stay (HOSPITAL_COMMUNITY)
Admission: EM | Admit: 2024-06-07 | Discharge: 2024-06-10 | DRG: 563 | Disposition: A | Discharge status: Home / Self Care | Attending: Hospitalist | Admitting: Hospitalist

## 2024-06-07 ENCOUNTER — Emergency Department (HOSPITAL_COMMUNITY)

## 2024-06-07 ENCOUNTER — Telehealth: Admitting: Family Medicine

## 2024-06-07 DIAGNOSIS — F319 Bipolar disorder, unspecified: Secondary | ICD-10-CM | POA: Diagnosis present

## 2024-06-07 DIAGNOSIS — M5442 Lumbago with sciatica, left side: Principal | ICD-10-CM | POA: Diagnosis present

## 2024-06-07 DIAGNOSIS — M5441 Lumbago with sciatica, right side: Principal | ICD-10-CM | POA: Diagnosis present

## 2024-06-07 DIAGNOSIS — Z955 Presence of coronary angioplasty implant and graft: Secondary | ICD-10-CM

## 2024-06-07 DIAGNOSIS — I10 Essential (primary) hypertension: Secondary | ICD-10-CM | POA: Diagnosis present

## 2024-06-07 DIAGNOSIS — Z7902 Long term (current) use of antithrombotics/antiplatelets: Secondary | ICD-10-CM

## 2024-06-07 DIAGNOSIS — I7121 Aneurysm of the ascending aorta, without rupture: Secondary | ICD-10-CM | POA: Diagnosis present

## 2024-06-07 DIAGNOSIS — E7800 Pure hypercholesterolemia, unspecified: Secondary | ICD-10-CM | POA: Diagnosis present

## 2024-06-07 DIAGNOSIS — I77810 Thoracic aortic ectasia: Secondary | ICD-10-CM | POA: Diagnosis not present

## 2024-06-07 DIAGNOSIS — I2510 Atherosclerotic heart disease of native coronary artery without angina pectoris: Secondary | ICD-10-CM | POA: Diagnosis present

## 2024-06-07 DIAGNOSIS — F909 Attention-deficit hyperactivity disorder, unspecified type: Secondary | ICD-10-CM | POA: Diagnosis not present

## 2024-06-07 DIAGNOSIS — Z8249 Family history of ischemic heart disease and other diseases of the circulatory system: Secondary | ICD-10-CM

## 2024-06-07 DIAGNOSIS — Z823 Family history of stroke: Secondary | ICD-10-CM

## 2024-06-07 DIAGNOSIS — S39012A Strain of muscle, fascia and tendon of lower back, initial encounter: Principal | ICD-10-CM | POA: Diagnosis present

## 2024-06-07 DIAGNOSIS — I252 Old myocardial infarction: Secondary | ICD-10-CM

## 2024-06-07 DIAGNOSIS — X58XXXA Exposure to other specified factors, initial encounter: Secondary | ICD-10-CM | POA: Diagnosis present

## 2024-06-07 DIAGNOSIS — M549 Dorsalgia, unspecified: Secondary | ICD-10-CM | POA: Diagnosis present

## 2024-06-07 DIAGNOSIS — K5900 Constipation, unspecified: Secondary | ICD-10-CM | POA: Diagnosis present

## 2024-06-07 DIAGNOSIS — K7581 Nonalcoholic steatohepatitis (NASH): Secondary | ICD-10-CM | POA: Diagnosis present

## 2024-06-07 DIAGNOSIS — Z7982 Long term (current) use of aspirin: Secondary | ICD-10-CM

## 2024-06-07 DIAGNOSIS — Z79899 Other long term (current) drug therapy: Secondary | ICD-10-CM

## 2024-06-07 DIAGNOSIS — M545 Low back pain, unspecified: Secondary | ICD-10-CM

## 2024-06-07 LAB — CBC WITH DIFFERENTIAL/PLATELET
Abs Immature Granulocytes: 0.05 K/uL (ref 0.00–0.07)
Basophils Absolute: 0 K/uL (ref 0.0–0.1)
Basophils Relative: 0 %
Eosinophils Absolute: 0 K/uL (ref 0.0–0.5)
Eosinophils Relative: 0 %
HCT: 46.2 % (ref 39.0–52.0)
Hemoglobin: 15.9 g/dL (ref 13.0–17.0)
Immature Granulocytes: 0 %
Lymphocytes Relative: 13 %
Lymphs Abs: 1.6 K/uL (ref 0.7–4.0)
MCH: 32.1 pg (ref 26.0–34.0)
MCHC: 34.4 g/dL (ref 30.0–36.0)
MCV: 93.1 fL (ref 80.0–100.0)
Monocytes Absolute: 0.9 K/uL (ref 0.1–1.0)
Monocytes Relative: 7 %
Neutro Abs: 10.4 K/uL — ABNORMAL HIGH (ref 1.7–7.7)
Neutrophils Relative %: 80 %
Platelets: 184 K/uL (ref 150–400)
RBC: 4.96 MIL/uL (ref 4.22–5.81)
RDW: 12.3 % (ref 11.5–15.5)
WBC: 13 K/uL — ABNORMAL HIGH (ref 4.0–10.5)
nRBC: 0 % (ref 0.0–0.2)

## 2024-06-07 LAB — COMPREHENSIVE METABOLIC PANEL WITH GFR
ALT: 27 U/L (ref 0–44)
AST: 22 U/L (ref 15–41)
Albumin: 4.2 g/dL (ref 3.5–5.0)
Alkaline Phosphatase: 45 U/L (ref 38–126)
Anion gap: 10 (ref 5–15)
BUN: 13 mg/dL (ref 6–20)
CO2: 29 mmol/L (ref 22–32)
Calcium: 9.3 mg/dL (ref 8.9–10.3)
Chloride: 101 mmol/L (ref 98–111)
Creatinine, Ser: 0.87 mg/dL (ref 0.61–1.24)
GFR, Estimated: >60 mL/min (ref >60)
Glucose, Bld: 117 mg/dL — ABNORMAL HIGH (ref 70–99)
Potassium: 3.7 mmol/L (ref 3.5–5.1)
Sodium: 140 mmol/L (ref 135–145)
Total Bilirubin: 0.5 mg/dL (ref 0.0–1.2)
Total Protein: 6.8 g/dL (ref 6.5–8.1)

## 2024-06-07 MED ORDER — IOHEXOL 350 MG/ML SOLN
100.0000 mL | Freq: Once | INTRAVENOUS | Status: AC | PRN
  Administered 2024-06-07: 100 mL via INTRAVENOUS

## 2024-06-07 MED ORDER — ACETAMINOPHEN 500 MG PO TABS
1000.0000 mg | ORAL_TABLET | Freq: Once | ORAL | Status: AC
  Administered 2024-06-07: 1000 mg via ORAL
  Filled 2024-06-07: qty 2

## 2024-06-07 MED ORDER — FENTANYL CITRATE PF 50 MCG/ML IJ SOSY
100.0000 ug | PREFILLED_SYRINGE | Freq: Once | INTRAMUSCULAR | Status: DC
  Filled 2024-06-07: qty 2

## 2024-06-07 MED ORDER — KETOROLAC TROMETHAMINE 30 MG/ML IJ SOLN
15.0000 mg | Freq: Once | INTRAMUSCULAR | Status: DC
  Filled 2024-06-07: qty 1

## 2024-06-07 MED ORDER — FENTANYL CITRATE PF 50 MCG/ML IJ SOSY
100.0000 ug | PREFILLED_SYRINGE | Freq: Once | INTRAMUSCULAR | Status: AC
  Administered 2024-06-07: 100 ug via INTRAVENOUS
  Filled 2024-06-07: qty 2

## 2024-06-07 MED ORDER — KETOROLAC TROMETHAMINE 15 MG/ML IJ SOLN
15.0000 mg | Freq: Once | INTRAMUSCULAR | Status: AC
  Administered 2024-06-07: 15 mg via INTRAVENOUS
  Filled 2024-06-07: qty 1

## 2024-06-07 MED ORDER — DIAZEPAM 5 MG/ML IJ SOLN
2.5000 mg | Freq: Once | INTRAMUSCULAR | Status: AC
  Administered 2024-06-07: 2.5 mg via INTRAVENOUS
  Filled 2024-06-07: qty 2

## 2024-06-07 MED ORDER — HYDROMORPHONE HCL 1 MG/ML IJ SOLN
1.0000 mg | Freq: Once | INTRAMUSCULAR | Status: DC
  Filled 2024-06-07: qty 1

## 2024-06-07 NOTE — H&P (Signed)
 Billy Andersen FMW:[id number] DOB: [birth year] DOA: 06/07/2024     PCP: [name], MD   Outpatient Specialists:  CARDS:   Dr. Redell Shallow, MD  NEphrology: *  Dr. No care team member to display  NEurology *   Dr. Pulmonary *  Dr.  Oncology * Dr.No care team member to display  GI* Dr.  Gwen, LB) No care team member to display Urology Dr. *  Patient arrived to ER on 06/07/24 at 1801 Referred by Attending Billy Andersen DASEN, DO   Patient coming from:    home Lives  With family     Chief Complaint:   Chief Complaint  Patient presents with   Back Pain    HPI: Billy Andersen is a 48 y.o. male with medical history significant of CAD, NSTEMI, ascending aortic dilation.      Presented with  back pain for the past 4 days Severe back pain Unable to stand or walk, severe back pain after bending over to pick up something bP 138/86 HR 85   Pain started past Friday [place] the [place] and [place] a shell when he stood up he had imidiate severe back pain  No trauma, no neurological complaints,  Pain burning  No urinary or bowel changes  No fever or chills , no saddle anesthesia Was seen and prescribed prednisone , flexeril    Denies significant ETOH intake *** Does not smoke*** but interested in quitting***  Denies marijuana use ***    Regarding pertinent Chronic problems: ***  ****Hyperlipidemia - *on statins {statin:315258}  Lipid Panel     Component Value Date/Time   CHOL 110 12/08/2023 0924   TRIG 164 (H) 12/08/2023 0924   HDL 41 12/08/2023 0924   CHOLHDL 2.7 12/08/2023 0924   CHOLHDL 3.7 09/14/2023 0020   VLDL 28 09/14/2023 0020   LDLCALC 42 12/08/2023 0924   LABVLDL 27 12/08/2023 0924     HTN on metoprolol   ***chronic CHF diastolic/systolic/ combined - last echo*** Recent Results (from the past 56199 hours)  ECHOCARDIOGRAM COMPLETE   Collection Time: 02/04/24  3:02 PM  Result Value   Area-P 1/2 3.27   S' Lateral 3.20   P 1/2  time 751   Est EF 55 - 60%   Narrative      ECHOCARDIOGRAM REPORT       IMPRESSIONS    1. Left ventricular ejection fraction, by estimation, is 55 to 60%. Left ventricular ejection fraction by 3D volume is 57 %. The left ventricle has normal function. The left ventricle has no regional wall motion abnormalities. Left ventricular diastolic  parameters were normal. The average left ventricular global longitudinal strain is -18.9 %. The global longitudinal strain is normal.  2. Right ventricular systolic function is normal. The right ventricular size is normal. There is normal pulmonary artery systolic pressure. The estimated right ventricular systolic pressure is 19.5 mmHg.  3. The mitral valve is normal in structure. Mild mitral valve regurgitation. No evidence of mitral stenosis.  4. The aortic valve is tricuspid. Aortic valve regurgitation is trivial. No aortic stenosis is present.  5. Aortic dilatation noted. There is mild dilatation of the ascending aorta, measuring 43 mm.  6. The inferior vena cava is normal in size with greater than 50% respiratory variability, suggesting right atrial pressure of 3 mmHg.           CAD  - On Aspirin ,   betablocker, Plavix                  -  followed by cardiology                  *** Asthma -well *** controlled on home inhalers/ nebs                     *** COPD - not **followed by pulmonology *** not  on baseline oxygen  *L,    *** OSA -on nocturnal oxygen, *CPAP, *noncompliant with CPAP    While in ER:     CTA - no dissection   CT lumbar spine non acute   Lab Orders         CBC with Differential         Comprehensive metabolic panel        CTa chest abd/pelvis - Aneurysmal ascending thoracic aorta  No central or segmental pulmonary embolus.  Enlarged main pulmonary artery-correlate for pulmonary hypertension. Following Medications were ordered in ER: Medications  fentaNYL  (SUBLIMAZE ) injection 100 mcg (has no administration in  time range)  acetaminophen  (TYLENOL ) tablet 1,000 mg (1,000 mg Oral Given 06/07/24 2003)  diazepam  (VALIUM ) injection 2.5 mg (2.5 mg Intravenous Given 06/07/24 2003)  fentaNYL  (SUBLIMAZE ) injection 100 mcg (100 mcg Intravenous Given 06/07/24 2002)  iohexol  (OMNIPAQUE ) 350 MG/ML injection 100 mL (100 mLs Intravenous Contrast Given 06/07/24 2136)  fentaNYL  (SUBLIMAZE ) injection 100 mcg (100 mcg Intravenous Given 06/07/24 2226)  ketorolac  (TORADOL ) 15 MG/ML injection 15 mg (15 mg Intravenous Given 06/07/24 2226)       ED Triage Vitals  Encounter Vitals Group     BP 06/07/24 1827 121/78     Girls Systolic BP Percentile --      Girls Diastolic BP Percentile --      Boys Systolic BP Percentile --      Boys Diastolic BP Percentile --      Pulse Rate 06/07/24 1827 76     Resp 06/07/24 1827 16     Temp 06/07/24 1827 98.6 F (37 C)     Temp Source 06/07/24 1827 Oral     SpO2 06/07/24 1827 100 %     Weight --      Height --      Head Circumference --      Peak Flow --      Pain Score 06/07/24 1829 10     Pain Loc --      Pain Education --      Exclude from Growth Chart --   UFJK(75)@     _________________________________________ Significant initial  Findings: Abnormal Labs Reviewed  CBC WITH DIFFERENTIAL/PLATELET - Abnormal; Notable for the following components:      Result Value   WBC 13.0 (*)    Neutro Abs 10.4 (*)    All other components within normal limits  COMPREHENSIVE METABOLIC PANEL WITH GFR - Abnormal; Notable for the following components:   Glucose, Bld 117 (*)    All other components within normal limits      _________________________ Troponin ***ordered Cardiac Panel (last 3 results) No results for input(s): CKTOTAL, CKMB, TROPONINIHS, RELINDX in the last 72 hours.   ECG: Ordered Personally reviewed and interpreted by me showing: HR : *** Rhythm: *NSR, Sinus tachycardia * A.fib. W RVR, RBBB, LBBB, Paced Ischemic changes*nonspecific changes, no evidence of ischemic  changes QTC*  BNP (last 3 results) Recent Labs    09/22/23 0518  BNP 9.7       The recent clinical data is shown below. Vitals:   06/07/24 1827 06/07/24 2015 06/07/24  2030 06/07/24 2233  BP: 121/78 123/71 120/74   Pulse: 76 68 64 69  Resp: 16 14 14 15   Temp: 98.6 F (37 C)   98.1 F (36.7 C)  TempSrc: Oral   Oral  SpO2: 100% 98% 98% 99%      WBC     Component Value Date/Time   WBC 13.0 (H) 06/07/2024 2037   LYMPHSABS 1.6 06/07/2024 2037   MONOABS 0.9 06/07/2024 2037   EOSABS 0.0 06/07/2024 2037   BASOSABS 0.0 06/07/2024 2037     UA not ordered       __________________________________________________________ Recent Labs  Lab 06/07/24 2037  NA 140  K 3.7  CO2 29  GLUCOSE 117*  BUN 13  CREATININE 0.87  CALCIUM  9.3    Cr  stable,    Lab Results  Component Value Date   CREATININE 0.87 06/07/2024   CREATININE 1.03 12/08/2023   CREATININE 1.02 11/17/2023    Recent Labs  Lab 06/07/24 2037  AST 22  ALT 27  ALKPHOS 45  BILITOT 0.5  PROT 6.8  ALBUMIN 4.2   Lab Results  Component Value Date   CALCIUM  9.3 06/07/2024    Plt: Lab Results  Component Value Date   PLT 184 06/07/2024       Recent Labs  Lab 06/07/24 2037  WBC 13.0*  NEUTROABS 10.4*  HGB 15.9  HCT 46.2  MCV 93.1  PLT 184    HG/HCT   stable,     Component Value Date/Time   HGB 15.9 06/07/2024 2037   HGB 16.2 09/14/2020 1535   HCT 46.2 06/07/2024 2037   MCV 93.1 06/07/2024 2037       _______________________________________________ Hospitalist was called for admission for  acute back pain    The following Work up has been ordered so far:  Orders Placed This Encounter  Procedures   CT Lumbar Spine Wo Contrast   CT Angio Chest/Abd/Pel for Dissection W and/or W/WO   CBC with Differential   Comprehensive metabolic panel   Consult to hospitalist     OTHER Significant initial  Findings:  labs showing:     DM  labs:  HbA1C: Recent Labs    09/13/23 1516  12/08/23 0924  HGBA1C 5.1 5.4       CBG (last 3)  No results for input(s): GLUCAP in the last 72 hours.        Cultures: No results found for: SDES, SPECREQUEST, CULT, REPTSTATUS   Radiological Exams on Admission: CT Lumbar Spine Wo Contrast Result Date: 06/07/2024 CLINICAL DATA:  Insert stray EXAM: CT LUMBAR SPINE WITHOUT CONTRAST TECHNIQUE: Multidetector CT imaging of the lumbar spine was performed without intravenous contrast administration. Multiplanar CT image reconstructions were also generated. RADIATION DOSE REDUCTION: This exam was performed according to the departmental dose-optimization program which includes automated exposure control, adjustment of the mA and/or kV according to patient size and/or use of iterative reconstruction technique. COMPARISON:  None Available. FINDINGS: Segmentation: 5 lumbar type vertebrae. Alignment: Normal. Vertebrae: Mild osteophyte formation and moderate facet arthropathy. No associated severe osseous neural foraminal or central canal stenosis. No acute fracture or focal pathologic process. Paraspinal and other soft tissues: Negative. Disc levels: Contained. IMPRESSION: No acute displaced fracture or traumatic listhesis of the lumbar spine. Electronically Signed   By: Morgane  Naveau M.D.   On: 06/07/2024 21:52   CT Angio Chest/Abd/Pel for Dissection W and/or W/WO Result Date: 06/07/2024 CLINICAL DATA:  Acute aortic syndrome (AAS) suspected EXAM: CT ANGIOGRAPHY CHEST, ABDOMEN  AND PELVIS TECHNIQUE: Non-contrast CT of the chest was initially obtained. Multidetector CT imaging through the chest, abdomen and pelvis was performed using the standard protocol during bolus administration of intravenous contrast. Multiplanar reconstructed images and MIPs were obtained and reviewed to evaluate the vascular anatomy. RADIATION DOSE REDUCTION: This exam was performed according to the departmental dose-optimization program which includes automated exposure  control, adjustment of the mA and/or kV according to patient size and/or use of iterative reconstruction technique. CONTRAST:  OMNIPAQUE  IOHEXOL  350 MG/ML SOLN COMPARISON:  None Available. FINDINGS: CTA CHEST FINDINGS Cardiovascular: Satisfactory opacification of the pulmonary arteries to the segmental level as well as the thoracic aorta. No evidence of pulmonary embolism. Normal heart size. No significant pericardial effusion. Aneurysmal ascending thoracic aorta measuring up to 4.3 cm. No aortic dissection. No atherosclerotic plaque of the thoracic aorta. No coronary artery calcifications. Aortic valve leaflet calcifications. The main pulmonary artery is enlarged in caliber measuring up to 3.3 cm. No central or segmental pulmonary embolus. Limited evaluation more distally due to timing of contrast. Mediastinum/Nodes: No enlarged mediastinal, hilar, or axillary lymph nodes. Thyroid  gland, trachea, and esophagus demonstrate no significant findings. Lungs/Pleura: Azygous fissure noted. No focal consolidation. No pulmonary nodule. No pulmonary mass. No pleural effusion. No pneumothorax. Musculoskeletal: No chest wall abnormality. No suspicious lytic or blastic osseous lesions. No acute displaced fracture. Review of the MIP images confirms the above findings. CTA ABDOMEN AND PELVIS FINDINGS VASCULAR Aorta: Normal caliber aorta without aneurysm, dissection, vasculitis or significant stenosis. Celiac: Patent without evidence of aneurysm, dissection, vasculitis or significant stenosis. SMA: Patent without evidence of aneurysm, dissection, vasculitis or significant stenosis. Renals: Both renal arteries are patent without evidence of aneurysm, dissection, vasculitis, fibromuscular dysplasia or significant stenosis. IMA: Patent without evidence of aneurysm, dissection, vasculitis or significant stenosis. Inflow: Patent without evidence of aneurysm, dissection, vasculitis or significant stenosis. Veins: No obvious venous  abnormality within the limitations of this arterial phase study. Review of the MIP images confirms the above findings. NON-VASCULAR Hepatobiliary: No focal liver abnormality. No gallstones, gallbladder wall thickening, or pericholecystic fluid. No biliary dilatation. Pancreas: No focal lesion. Normal pancreatic contour. No surrounding inflammatory changes. No main pancreatic ductal dilatation. Spleen: Normal in size without focal abnormality. Adrenals/Urinary Tract: No adrenal nodule bilaterally. Bilateral kidneys enhance symmetrically. No hydronephrosis. No hydroureter. The urinary bladder is unremarkable. Stomach/Bowel: Stomach is within normal limits. No evidence of bowel wall thickening or dilatation. Colonic diverticulosis. Appendix appears normal. Lymphatic: No lymphadenopathy. Reproductive: Prostate is unremarkable. Other: No intraperitoneal free fluid. No intraperitoneal free gas. No organized fluid collection. Musculoskeletal: No abdominal wall hernia or abnormality. No suspicious lytic or blastic osseous lesions. No acute displaced fracture. Please see separately dictated CT lumbar spine 06/07/2024. Review of the MIP images confirms the above findings. IMPRESSION: 1. Aneurysmal ascending thoracic aorta (4.3 cm). Recommend annual imaging followup by CTA or MRA. This recommendation follows 2010 ACCF/AHA/AATS/ACR/ASA/SCA/SCAI/SIR/STS/SVM Guidelines for the Diagnosis and Management of Patients with Thoracic Aortic Disease. Circulation. 2010; 121: Z733-z630. Aortic aneurysm NOS (ICD10-I71.9). 2. Aortic valve leaflet calcifications-correlate for aortic stenosis. 3. No central or segmental pulmonary embolus. Limited evaluation more distally. 4. Enlarged main pulmonary artery-correlate for pulmonary hypertension. 5. Colonic diverticulosis with no acute diverticulitis. Electronically Signed   By: Morgane  Naveau M.D.   On: 06/07/2024 21:51    _______________________________________________________________________________________________________ Latest  Blood pressure 120/74, pulse 69, temperature 98.1 F (36.7 C), temperature source Oral, resp. rate 15, SpO2 99%.   Vitals  labs and radiology finding personally reviewed  Review of Systems:    Pertinent positives include: ***  Constitutional:  No weight loss, night sweats, Fevers, chills, fatigue, weight loss  HEENT:  No headaches, Difficulty swallowing,Tooth/dental problems,Sore throat,  No sneezing, itching, ear ache, nasal congestion, post nasal drip,  Cardio-vascular:  No chest pain, Orthopnea, PND, anasarca, dizziness, palpitations.no Bilateral lower extremity swelling  GI:  No heartburn, indigestion, abdominal pain, nausea, vomiting, diarrhea, change in bowel habits, loss of appetite, melena, blood in stool, hematemesis Resp:  no shortness of breath at rest. No dyspnea on exertion, No excess mucus, no productive cough, No non-productive cough, No coughing up of blood.No change in color of mucus.No wheezing. Skin:  no rash or lesions. No jaundice GU:  no dysuria, change in color of urine, no urgency or frequency. No straining to urinate.  No flank pain.  Musculoskeletal:  No joint pain or no joint swelling. No decreased range of motion. No back pain.  Psych:  No change in mood or affect. No depression or anxiety. No memory loss.  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  Iron normal   Ruptured plantar fascia    left, 2022      Past Surgical History:  Procedure Laterality Date   CORONARY STENT INTERVENTION N/A 09/14/2023   Procedure: CORONARY STENT INTERVENTION;  Surgeon: Ladona Heinz, MD;  Location: [facility] INVASIVE CV LAB;  Service: Cardiovascular;  Laterality: N/A;   EXTERNAL EAR SURGERY Right    trauma (bit off)   LEFT HEART CATH AND CORONARY ANGIOGRAPHY N/A 09/14/2023   Procedure: LEFT HEART CATH AND CORONARY ANGIOGRAPHY;  Surgeon: Ladona Heinz, MD;  Location: [facility] INVASIVE CV LAB;  Service: Cardiovascular;  Laterality: N/A;   LEFT HEART CATH AND CORONARY ANGIOGRAPHY N/A 09/22/2023   Procedure: LEFT HEART CATH AND CORONARY ANGIOGRAPHY;  Surgeon: Wonda Sharper, MD;  Location: [facility] INVASIVE CV LAB;  Service: Cardiovascular;  Laterality: N/A;    Social History:  Ambulatory   independently      reports that he has never smoked. He has never used smokeless tobacco. He reports that he does not currently use alcohol. He reports that he does not use drugs.   Family History:   Family History  Problem Relation Age of Onset   Rectal cancer Mother    Cancer Mother 66       Cholangiocarcinoma   Heart disease Father 71       CABG   Thyroid  disease Father    Heart disease Maternal Grandmother    Heart attack Maternal Grandfather    Crohn's disease Paternal Grandmother    Stroke Paternal Grandfather    Heart failure Paternal Grandfather    Diabetes Paternal Aunt    Diabetes Paternal Uncle    Lactose intolerance Daughter    ______________________________________________________________________________________________ Allergies: No Known Allergies   Prior to Admission medications  Medication Sig Start Date End  Date Taking? Authorizing Provider  ASPIRIN  LOW DOSE 81 MG chewable tablet chew ONE TABLET BY MOUTH ONCE DAILY 04/13/24   Nahser, Aleene PARAS, MD  atomoxetine  (STRATTERA ) 80 MG capsule Take 80 mg by mouth daily. 09/01/23   [provider]  CAPLYTA  42 MG capsule Take 42 mg by mouth at bedtime. 09/01/23   [provider]  clopidogrel  (PLAVIX ) 75 MG tablet TAKE ONE TABLET BY MOUTH EVERY DAY WITH BREAKFAST 03/01/24   Nahser, Aleene PARAS, MD  cyclobenzaprine  (FLEXERIL ) 10 MG tablet Take 1 tablet (10 mg total) by mouth 2 (two) times daily as needed for muscle spasms. 06/06/24   Robinson, John K, [name]  HYDROcodone -acetaminophen  (NORCO/VICODIN) 5-325 MG tablet Take 1 tablet by mouth every 6 (six) hours as needed. 06/06/24   Robinson, John K, [name]  lamoTRIgine  (LAMICTAL ) 200 MG tablet Take 200 mg by mouth daily. 11/23/23   [provider]  metoprolol  tartrate (LOPRESSOR ) 25 MG tablet Take 1 tablet (25 mg total) by mouth 2 (two) times daily. 01/04/24   Nahser, Aleene PARAS, MD  Misc Natural Products (AIRBORNE ELDERBERRY) 100-50 MG CHEW Chew 2 tablets by mouth daily.    [provider]  nitroGLYCERIN  (NITROSTAT ) 0.4 MG SL tablet Place 1 tablet (0.4 mg total) under the tongue every 5 (five) minutes x 3 doses as needed for chest pain. 09/14/23   Ladona Heinz, MD  Omega Fatty Acids-Vitamins (OMEGA-3 GUMMIES) CHEW Chew 2 tablets by mouth daily. [employer]    [provider]  predniSONE  (DELTASONE ) 10 MG tablet Take 2 tablets (20 mg total) by mouth daily for 5 days. 06/06/24 06/11/24  Lang Norleen POUR, [name]  Resmetirom  (REZDIFFRA ) 80 MG TABS Take 1 tablet by mouth daily. Sent to [facility] Patient Support fax [phone]; phone [phone] Patient not taking: Reported on 05/17/2024 05/05/24   Mollie Nestor HERO, [name]  rosuvastatin  (CRESTOR ) 40 MG tablet TAKE ONE TABLET BY MOUTH EVERY DAY 11/03/23   Lelon Hamilton T, [name]     ___________________________________________________________________________________________________ Physical Exam:    06/07/2024   10:33 PM 06/07/2024    8:30 PM 06/07/2024    8:15 PM  Vitals with BMI  Systolic  120 123  Diastolic  74 71  Pulse 69 64 68     1. General:  in No ***Acute distress***increased work of breathing ***complaining of severe pain****agitated * Chronically ill *well *cachectic *toxic acutely ill -appearing 2. Psychological: Alert and *** Oriented 3. Head/ENT:   Moist *** Dry Mucous Membranes                          Head Non traumatic, neck supple                          Normal *** Poor Dentition 4. SKIN: normal *** decreased Skin turgor,  Skin clean Dry and intact no rash    5. Heart: Regular rate and rhythm no*** Murmur, no Rub or gallop 6. Lungs: ***Clear to auscultation bilaterally, no wheezes or crackles   7. Abdomen: Soft, ***non-tender, Non distended *** obese ***bowel sounds present 8. Lower extremities: no clubbing, cyanosis, no ***edema 9. Neurologically Grossly intact, moving all 4 extremities equally *** strength 5 out of 5 in all 4 extremities cranial nerves II through XII intact 10. MSK: Normal range of motion    Chart has been reviewed  ______________________________________________________________________________________________  Assessment/Plan 48 y.o. male with medical history significant of  CAD, NSTEMI, ascending aortic dilation.   Admitted for  acute back pain    Present on Admission: **None**     No problem-specific Assessment & Plan notes found for this encounter.    Other plan as per orders.  DVT prophylaxis:  SCD     Code Status:    Code Status: Prior FULL CODE *** DNR/DNI ***comfort care as per patient ***family  I had personally discussed CODE STATUS with patient and family*  ACP *** none has been reviewed ***   Family Communication:   Family not at  Bedside  plan of care was discussed on the phone with ***  Son, Daughter, Wife, Husband, Sister, Brother , father, mother  Diet    Disposition Plan:   *** likely will need placement for rehabilitation                          Back to current facility when stable                            To home once workup is complete and patient is stable  ***Following barriers for discharge:                             Chest pain *** Stroke *** Syncope ***work up is complete                            Electrolytes corrected                               Anemia corrected h/H stable                             Pain controlled with PO medications                               Afebrile, white count improving able to transition to PO antibiotics                             Will need to be able to tolerate PO                            Will likely need home health, home O2, set up                           Will need consultants to evaluate patient prior to discharge                           Work of breathing improves       Consult Orders  (From admission, onward)           Start     Ordered   06/07/24 2319  Consult to hospitalist  Once       Provider:  (Not yet assigned)  Question Answer Comment  Place call to: [facility]   Reason for Consult Admit      06/07/24 2318                              ***  Would benefit from PT/OT eval prior to DC  Ordered                   Swallow eval - SLP ordered                   Diabetes care coordinator                   Transition of care consulted                   Nutrition    consulted                  Wound care  consulted                   Palliative care    consulted                   Behavioral health  consulted                    Consults called: ***  NONE   Admission status:  ED Disposition     ED Disposition  Admit   Condition  --   Comment  The patient appears reasonably stabilized for admission considering the current resources, flow, and capabilities available in the ED at  this time, and I doubt any other [facility] requiring further screening and/or treatment in the ED prior to admission is  present.           Obs***  ***  inpatient     I Expect 2 midnight stay secondary to severity of patient's current illness need for inpatient interventions justified by the following: ***hemodynamic instability despite optimal treatment (tachycardia *hypotension * tachypnea *hypoxia, hypercapnia) *** Severe lab/radiological/exam abnormalities including:    There are no diagnoses linked to this encounter. and extensive comorbidities including: *substance abuse  *Chronic pain *DM2  * CHF * CAD  * COPD/asthma *Morbid Obesity * CKD *dementia *liver disease *history of stroke with residual deficits *  malignancy, * sickle cell disease  History of amputation Chronic anticoagulation  That are currently affecting medical management.   I expect  patient to be hospitalized for 2 midnights requiring inpatient medical care.  Patient is at high risk for adverse outcome (such as loss of life or disability) if not treated.  Indication for inpatient stay as follows:  Severe change from baseline regarding mental status Hemodynamic instability despite maximal medical therapy,  severe pain requiring acute inpatient management,  inability to maintain oral hydration   persistent chest pain despite medical management Need for operative/procedural  intervention New or worsening hypoxia ongoing suicidal ideations   Need for IV antibiotics, IV fluids,, IV pain medications, IV anticoagulation,  IV rate controling medications, IV antihypertensives need for biPAP Frequent labs    Level of care   *** tele  For 12H 24H     medical floor       progressive     stepdown   tele indefinitely please discontinue once patient no longer qualifies COVID-19 Labs    Critical***  Patient is critically ill due to  hemodynamic instability * respiratory failure *severe sepsis* ongoing chest  pain*  They are at high risk for life/limb threatening clinical deterioration requiring frequent reassessment and modifications of care.  Services provided include examination of the patient, review of relevant ancillary tests, prescription of lifesaving therapies, review of medications and prophylactic therapy.  Total critical care  time excluding separately billable procedures: 60*  Minutes.    Dionne Rossa 06/07/2024, 11:27 PM ***  [facility]     after 2 AM please page floor coverage   If 7AM-7PM, please contact the day team taking care of the patient using [email or web address]

## 2024-06-07 NOTE — Progress Notes (Signed)
 Billy Andersen   Unable to stand, walk, or move- needs ED  Advised to have someone drive him or call EMS given his lack of ability to move.   Patient acknowledged agreement and understanding of the plan.

## 2024-06-07 NOTE — ED Triage Notes (Signed)
 Per EMS from home. Excruciating back pain after bending over to pick something up.   BP 138/86 HR 85 RR 18 96 on RA
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
## 2024-06-07 NOTE — ED Provider Notes (Signed)
  EMERGENCY DEPARTMENT AT [facility] Provider Note  CSN: [id number] Arrival date & time: 06/07/24 1801  Chief Complaint(s) Back Pain  HPI Billy Andersen is a 48 y.o. male who is here today with back pain.  Patient reports that he initially injured his back few days ago when he bent down to pick something up [place] the [place].  He says that since then, he has been having multiple episodes where he would suddenly have severe pain in his back.  Says that the pain also radiates into his abdomen.  No bowel or bladder incontinence/retention.   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  Iron normal   Ruptured plantar fascia    left, 2022   Patient Active Problem List   Diagnosis Date Noted   History of multiple concussions 04/03/2024   Cognitive and behavioral changes 04/03/2024   Ascending aorta dilation (HCC) 10/02/2023   Pure hypercholesterolemia 09/22/2023   Chest pain 09/21/2023   Dyspnea 09/21/2023   Non-ST elevation (NSTEMI) myocardial infarction [facility]) 09/15/2023   CAD (coronary artery disease) 09/15/2023   Home Medication(s) Prior to Admission medications   Medication Sig Start Date End Date Taking? Authorizing Provider  ASPIRIN  LOW DOSE 81 MG chewable tablet chew ONE TABLET BY MOUTH ONCE DAILY 04/13/24   Nahser, Aleene PARAS, MD  atomoxetine  (STRATTERA ) 80 MG capsule Take 80 mg by mouth daily. 09/01/23   [provider]  CAPLYTA  42 MG capsule Take 42 mg by mouth at bedtime. 09/01/23   [provider]  clopidogrel  (PLAVIX ) 75 MG tablet TAKE ONE TABLET BY MOUTH EVERY DAY WITH BREAKFAST 03/01/24   Nahser, Aleene PARAS, MD  cyclobenzaprine  (FLEXERIL ) 10 MG tablet Take 1 tablet (10 mg total) by mouth 2 (two) times daily as needed for muscle spasms. 06/06/24   Robinson, John K, [name]  HYDROcodone -acetaminophen  (NORCO/VICODIN) 5-325 MG tablet Take 1 tablet by mouth every 6 (six) hours as needed. 06/06/24   Robinson, John K, [name]  lamoTRIgine  (LAMICTAL ) 200 MG tablet Take 200 mg by mouth daily. 11/23/23   [provider]  metoprolol  tartrate (LOPRESSOR ) 25 MG tablet Take 1 tablet (25 mg total) by mouth 2 (two) times daily. 01/04/24   Nahser, Aleene PARAS, MD  Misc Natural Products (AIRBORNE ELDERBERRY) 100-50 MG CHEW Chew 2 tablets by mouth daily.    [provider]  nitroGLYCERIN  (NITROSTAT ) 0.4 MG SL tablet Place 1 tablet (0.4 mg total) under the tongue every 5 (five) minutes x 3 doses as needed for chest pain. 09/14/23   Ladona Heinz, MD  Omega Fatty Acids-Vitamins (OMEGA-3 GUMMIES) CHEW Chew 2 tablets by mouth daily. [employer]    [provider]  predniSONE  (DELTASONE ) 10 MG tablet Take 2 tablets (20 mg total) by mouth daily for 5 days. 06/06/24 06/11/24  Robinson, John K, [name]  Resmetirom  (REZDIFFRA ) 80 MG TABS Take 1 tablet by mouth daily. Sent to [facility] Patient Support fax [phone]; phone [phone] Patient not taking: Reported on 05/17/2024 05/05/24   Mollie Pfeiffer  M, [name]  rosuvastatin  (CRESTOR ) 40 MG tablet TAKE ONE TABLET BY MOUTH EVERY DAY 11/03/23   Lelon Glendia DASEN, [name]                                                                                                                                    Past Surgical History Past Surgical History:  Procedure Laterality Date   CORONARY STENT INTERVENTION N/A 09/14/2023   Procedure: CORONARY STENT INTERVENTION;  Surgeon: Ladona Heinz, MD;  Location: [facility] INVASIVE CV LAB;  Service: Cardiovascular;  Laterality: N/A;    EXTERNAL EAR SURGERY Right    trauma (bit off)   LEFT HEART CATH AND CORONARY ANGIOGRAPHY N/A 09/14/2023   Procedure: LEFT HEART CATH AND CORONARY ANGIOGRAPHY;  Surgeon: Ladona Heinz, MD;  Location: [facility] INVASIVE CV LAB;  Service: Cardiovascular;  Laterality: N/A;   LEFT HEART CATH AND CORONARY ANGIOGRAPHY N/A 09/22/2023   Procedure: LEFT HEART CATH AND CORONARY ANGIOGRAPHY;  Surgeon: Wonda Sharper, MD;  Location: [facility] INVASIVE CV LAB;  Service: Cardiovascular;  Laterality: N/A;   Family History Family History  Problem Relation Age of Onset   Rectal cancer Mother    Cancer Mother 71       Cholangiocarcinoma   Heart disease Father 70       CABG   Thyroid  disease Father    Heart disease Maternal Grandmother    Heart attack Maternal Grandfather    Crohn's disease Paternal Grandmother    Stroke Paternal Grandfather    Heart failure Paternal Grandfather    Diabetes Paternal Aunt    Diabetes Paternal Uncle    Lactose intolerance Daughter     Social History Social History   Tobacco Use   Smoking status: Never   Smokeless tobacco: Never  Vaping Use   Vaping status: Never Used  Substance Use Topics   Alcohol use: Not Currently   Drug use: Never   Allergies Patient has no known allergies.  Review of Systems Review of Systems  Physical Exam Vital Signs  I have reviewed the triage vital signs BP 113/78   Pulse 61   Temp 98.1 F (36.7 C) (Oral)   Resp 13   SpO2 99%   Physical Exam Vitals and nursing note reviewed.  Constitutional:      Comments: Extremely uncomfortable.  Pulmonary:     Effort: Pulmonary effort is normal.  Abdominal:     General: Abdomen is flat. There is no distension.     Palpations: Abdomen is soft.     Tenderness: There is no abdominal tenderness. There is no guarding.  Neurological:     Mental Status: He is alert.     Comments: Patient with 5/5 strength with plantarflexion and dorsiflexion.  He is able lift both legs off the bed without any  weakness.  Is a little bit limited secondary to pain in his lower back.  He has no numbness or tingling in his  bilateral lower extremities, no saddle anesthesia.     ED Results and Treatments Labs (all labs ordered are listed, but only abnormal results are displayed) Labs Reviewed  CBC WITH DIFFERENTIAL/PLATELET - Abnormal; Notable for the following components:      Result Value   WBC 13.0 (*)    Neutro Abs 10.4 (*)    All other components within normal limits  COMPREHENSIVE METABOLIC PANEL WITH GFR - Abnormal; Notable for the following components:   Glucose, Bld 117 (*)    All other components within normal limits                                                                                                                          Radiology CT Lumbar Spine Wo Contrast Result Date: 06/07/2024 CLINICAL DATA:  Insert stray EXAM: CT LUMBAR SPINE WITHOUT CONTRAST TECHNIQUE: Multidetector CT imaging of the lumbar spine was performed without intravenous contrast administration. Multiplanar CT image reconstructions were also generated. RADIATION DOSE REDUCTION: This exam was performed according to the departmental dose-optimization program which includes automated exposure control, adjustment of the mA and/or kV according to patient size and/or use of iterative reconstruction technique. COMPARISON:  None Available. FINDINGS: Segmentation: 5 lumbar type vertebrae. Alignment: Normal. Vertebrae: Mild osteophyte formation and moderate facet arthropathy. No associated severe osseous neural foraminal or central canal stenosis. No acute fracture or focal pathologic process. Paraspinal and other soft tissues: Negative. Disc levels: Contained. IMPRESSION: No acute displaced fracture or traumatic listhesis of the lumbar spine. Electronically Signed   By: Morgane  Naveau M.D.   On: 06/07/2024 21:52   CT Angio Chest/Abd/Pel for Dissection W and/or W/WO Result Date: 06/07/2024 CLINICAL DATA:  Acute aortic  syndrome (AAS) suspected EXAM: CT ANGIOGRAPHY CHEST, ABDOMEN AND PELVIS TECHNIQUE: Non-contrast CT of the chest was initially obtained. Multidetector CT imaging through the chest, abdomen and pelvis was performed using the standard protocol during bolus administration of intravenous contrast. Multiplanar reconstructed images and MIPs were obtained and reviewed to evaluate the vascular anatomy. RADIATION DOSE REDUCTION: This exam was performed according to the departmental dose-optimization program which includes automated exposure control, adjustment of the mA and/or kV according to patient size and/or use of iterative reconstruction technique. CONTRAST:  OMNIPAQUE  IOHEXOL  350 MG/ML SOLN COMPARISON:  None Available. FINDINGS: CTA CHEST FINDINGS Cardiovascular: Satisfactory opacification of the pulmonary arteries to the segmental level as well as the thoracic aorta. No evidence of pulmonary embolism. Normal heart size. No significant pericardial effusion. Aneurysmal ascending thoracic aorta measuring up to 4.3 cm. No aortic dissection. No atherosclerotic plaque of the thoracic aorta. No coronary artery calcifications. Aortic valve leaflet calcifications. The main pulmonary artery is enlarged in caliber measuring up to 3.3 cm. No central or segmental pulmonary embolus. Limited evaluation more distally due to timing of contrast. Mediastinum/Nodes: No enlarged mediastinal, hilar, or axillary lymph nodes. Thyroid  gland, trachea, and esophagus demonstrate no significant findings. Lungs/Pleura: Azygous fissure noted. No focal consolidation. No pulmonary nodule. No pulmonary  mass. No pleural effusion. No pneumothorax. Musculoskeletal: No chest wall abnormality. No suspicious lytic or blastic osseous lesions. No acute displaced fracture. Review of the MIP images confirms the above findings. CTA ABDOMEN AND PELVIS FINDINGS VASCULAR Aorta: Normal caliber aorta without aneurysm, dissection, vasculitis or significant  stenosis. Celiac: Patent without evidence of aneurysm, dissection, vasculitis or significant stenosis. SMA: Patent without evidence of aneurysm, dissection, vasculitis or significant stenosis. Renals: Both renal arteries are patent without evidence of aneurysm, dissection, vasculitis, fibromuscular dysplasia or significant stenosis. IMA: Patent without evidence of aneurysm, dissection, vasculitis or significant stenosis. Inflow: Patent without evidence of aneurysm, dissection, vasculitis or significant stenosis. Veins: No obvious venous abnormality within the limitations of this arterial phase study. Review of the MIP images confirms the above findings. NON-VASCULAR Hepatobiliary: No focal liver abnormality. No gallstones, gallbladder wall thickening, or pericholecystic fluid. No biliary dilatation. Pancreas: No focal lesion. Normal pancreatic contour. No surrounding inflammatory changes. No main pancreatic ductal dilatation. Spleen: Normal in size without focal abnormality. Adrenals/Urinary Tract: No adrenal nodule bilaterally. Bilateral kidneys enhance symmetrically. No hydronephrosis. No hydroureter. The urinary bladder is unremarkable. Stomach/Bowel: Stomach is within normal limits. No evidence of bowel wall thickening or dilatation. Colonic diverticulosis. Appendix appears normal. Lymphatic: No lymphadenopathy. Reproductive: Prostate is unremarkable. Other: No intraperitoneal free fluid. No intraperitoneal free gas. No organized fluid collection. Musculoskeletal: No abdominal wall hernia or abnormality. No suspicious lytic or blastic osseous lesions. No acute displaced fracture. Please see separately dictated CT lumbar spine 06/07/2024. Review of the MIP images confirms the above findings. IMPRESSION: 1. Aneurysmal ascending thoracic aorta (4.3 cm). Recommend annual imaging followup by CTA or MRA. This recommendation follows 2010 ACCF/AHA/AATS/ACR/ASA/SCA/SCAI/SIR/STS/SVM Guidelines for the Diagnosis and  Management of Patients with Thoracic Aortic Disease. Circulation. 2010; 121: Z733-z630. Aortic aneurysm NOS (ICD10-I71.9). 2. Aortic valve leaflet calcifications-correlate for aortic stenosis. 3. No central or segmental pulmonary embolus. Limited evaluation more distally. 4. Enlarged main pulmonary artery-correlate for pulmonary hypertension. 5. Colonic diverticulosis with no acute diverticulitis. Electronically Signed   By: Morgane  Naveau M.D.   On: 06/07/2024 21:51    Pertinent labs & imaging results that were available during my care of the patient were reviewed by me and considered in my medical decision making (see MDM for details).  Medications Ordered in ED Medications  fentaNYL  (SUBLIMAZE ) injection 100 mcg (has no administration in time range)  acetaminophen  (TYLENOL ) tablet 1,000 mg (1,000 mg Oral Given 06/07/24 2003)  diazepam  (VALIUM ) injection 2.5 mg (2.5 mg Intravenous Given 06/07/24 2003)  fentaNYL  (SUBLIMAZE ) injection 100 mcg (100 mcg Intravenous Given 06/07/24 2002)  iohexol  (OMNIPAQUE ) 350 MG/ML injection 100 mL (100 mLs Intravenous Contrast Given 06/07/24 2136)  fentaNYL  (SUBLIMAZE ) injection 100 mcg (100 mcg Intravenous Given 06/07/24 2226)  ketorolac  (TORADOL ) 15 MG/ML injection 15 mg (15 mg Intravenous Given 06/07/24 2226)  Procedures Procedures  (including critical care time)  Medical Decision Making / ED Course   This patient presents to the ED for concern of back pain, this involves an extensive number of treatment options, and is a complaint that carries with it a high risk of complications and morbidity.  The differential diagnosis includes musculoskeletal back pain, aortic injury, disc hernia, less likely cauda equina, less likely spinous infection.  MDM: Patient does have a history of aortic aneurysm.  Will obtain CTA.  Will also obtain  noncontrasted CT of the patient's lumbar spine.  Patient without any neurological deficits.  Will provide analgesia.  Basic labs ordered.  Reassessment 11:30 PM-patient's CTA negative aside from known thoracic aneurysm.  His CT lumbar spine negative.  Reevaluated patient's lower extremities, continues to have intact strength and sensation.  Unfortunately, was unable to improve patient's pain to the point where he was able to go home.  Will admit patient to hospitalist for pain management.   Additional history obtained: -Additional history obtained from wife at bedside -External records from outside source obtained and reviewed including: Chart review including previous notes, labs, imaging, consultation notes   Lab Tests: -I ordered, reviewed, and interpreted labs.   The pertinent results include:   Labs Reviewed  CBC WITH DIFFERENTIAL/PLATELET - Abnormal; Notable for the following components:      Result Value   WBC 13.0 (*)    Neutro Abs 10.4 (*)    All other components within normal limits  COMPREHENSIVE METABOLIC PANEL WITH GFR - Abnormal; Notable for the following components:   Glucose, Bld 117 (*)    All other components within normal limits      Imaging Studies ordered: I ordered imaging studies including CTA, CT lumbar spine I independently visualized and interpreted imaging. I agree with the radiologist interpretation   Medicines ordered and prescription drug management: Meds ordered this encounter  Medications   DISCONTD: HYDROmorphone  (DILAUDID ) injection 1 mg   DISCONTD: ketorolac  (TORADOL ) 30 MG/ML injection 15 mg   acetaminophen  (TYLENOL ) tablet 1,000 mg   diazepam  (VALIUM ) injection 2.5 mg   fentaNYL  (SUBLIMAZE ) injection 100 mcg   iohexol  (OMNIPAQUE ) 350 MG/ML injection 100 mL   fentaNYL  (SUBLIMAZE ) injection 100 mcg   ketorolac  (TORADOL ) 15 MG/ML injection 15 mg   fentaNYL  (SUBLIMAZE ) injection 100 mcg    -I have reviewed the patients home medicines and  have made adjustments as needed   Cardiac Monitoring: The patient was maintained on a cardiac monitor.  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  Iron normal   Ruptured plantar fascia    left, 2022       Final Clinical Impression(s) / ED Diagnoses Final diagnoses:  Acute bilateral low back pain with bilateral sciatica     @PCDICTATION @    Mannie Pac T, DO 06/07/24 2335

## 2024-06-07 NOTE — ED Notes (Signed)
 Nurse remained at patient bedside for monitoring at this time and for patient comfort. Wife at bedside.

## 2024-06-07 NOTE — ED Notes (Signed)
 Nurse remained at patient bedside for patient comfort at this time until he felt ok with nurse leaving out the room after medication administration. All vitals remained stable.

## 2024-06-07 NOTE — ED Notes (Signed)
 Provided patient wife with warm blankets x 2

## 2024-06-07 NOTE — H&P (Incomplete)
 Billy Andersen FMW:[id number] DOB: 1976-10-11 DOA: 06/07/2024     PCP: [name], MD   Outpatient Specialists:  CARDS:   Dr. Redell Shallow, MD  Neurology Dr. Ines [facility]   [facility]    Patient arrived to ER on 06/07/24 at 1801 Referred by Attending Mannie Fairy DASEN, DO   Patient coming from:    home Lives  With family     Chief Complaint:   Chief Complaint  Patient presents with  . Back Pain    HPI: Billy Andersen is a 48 y.o. male with medical history significant of CAD, NSTEMI 09/2023, ascending aortic dilation, NASH, hx of concussion with mild cognitive decline, bipolar, ADHD    Presented with  back pain for the past 4 days Severe back pain Unable to stand or walk, severe back pain after bending over to pick up something bP 138/86 HR 85   Pain started past Friday [place] the [place] and [place] a shell when he stood up he had imidiate severe back pain  No trauma, no neurological complaints,  Pain burning down the front of left leg No urinary or bowel changes  No fever or chills , no saddle anesthesia Was seen and prescribed prednisone , flexeril    Denies significant ETOH intake   Does not smoke       Regarding pertinent Chronic problems:    Hyperlipidemia -  on statins crestor  Lipid Panel     Component Value Date/Time   CHOL 110 12/08/2023 0924   TRIG 164 (H) 12/08/2023 0924   HDL 41 12/08/2023 0924   CHOLHDL 2.7 12/08/2023 0924   CHOLHDL 3.7 09/14/2023 0020   VLDL 28 09/14/2023 0020   LDLCALC 42 12/08/2023 0924   LABVLDL 27 12/08/2023 0924     HTN on metoprolol     last echo  Recent Results (from the past 56199 hours)  ECHOCARDIOGRAM COMPLETE   Collection Time: 02/04/24  3:02 PM  Result Value   Area-P 1/2 3.27   S' Lateral 3.20   P 1/2 time 751   Est EF 55 - 60%   Narrative      ECHOCARDIOGRAM REPORT       IMPRESSIONS    1. Left ventricular ejection fraction, by estimation, is 55 to 60%. Left ventricular  ejection fraction by 3D volume is 57 %. The left ventricle has normal function. The left ventricle has no regional wall motion abnormalities. Left ventricular diastolic  parameters were normal. The average left ventricular global longitudinal strain is -18.9 %. The global longitudinal strain is normal.  2. Right ventricular systolic function is normal. The right ventricular size is normal. There is normal pulmonary artery systolic pressure. The estimated right ventricular systolic pressure is 19.5 mmHg.  3. The mitral valve is normal in structure. Mild mitral valve regurgitation. No evidence of mitral stenosis.  4. The aortic valve is tricuspid. Aortic valve regurgitation is trivial. No aortic stenosis is present.  5. Aortic dilatation noted. There is mild dilatation of the ascending aorta, measuring 43 mm.  6. The inferior vena cava is normal in size with greater than 50% respiratory variability, suggesting right atrial pressure of 3 mmHg.           CAD  - On Aspirin , statin  betablocker, Plavix                  -  followed by cardiology  While in ER:    CTA - no dissection   CT lumbar spine non acute   Lab Orders         CBC with Differential         Comprehensive metabolic panel      CTa chest abd/pelvis - Aneurysmal ascending thoracic aorta  No central or segmental pulmonary embolus.  Enlarged main pulmonary artery-correlate for pulmonary hypertension. Following Medications were ordered in ER: Medications  fentaNYL  (SUBLIMAZE ) injection 100 mcg (has no administration in time range)  acetaminophen  (TYLENOL ) tablet 1,000 mg (1,000 mg Oral Given 06/07/24 2003)  diazepam  (VALIUM ) injection 2.5 mg (2.5 mg Intravenous Given 06/07/24 2003)  fentaNYL  (SUBLIMAZE ) injection 100 mcg (100 mcg Intravenous Given 06/07/24 2002)  iohexol  (OMNIPAQUE ) 350 MG/ML injection 100 mL (100 mLs Intravenous Contrast Given 06/07/24 2136)  fentaNYL  (SUBLIMAZE ) injection 100 mcg (100 mcg  Intravenous Given 06/07/24 2226)  ketorolac  (TORADOL ) 15 MG/ML injection 15 mg (15 mg Intravenous Given 06/07/24 2226)       ED Triage Vitals  Encounter Vitals Group     BP 06/07/24 1827 121/78     Girls Systolic BP Percentile --      Girls Diastolic BP Percentile --      Boys Systolic BP Percentile --      Boys Diastolic BP Percentile --      Pulse Rate 06/07/24 1827 76     Resp 06/07/24 1827 16     Temp 06/07/24 1827 98.6 F (37 C)     Temp Source 06/07/24 1827 Oral     SpO2 06/07/24 1827 100 %     Weight --      Height --      Head Circumference --      Peak Flow --      Pain Score 06/07/24 1829 10     Pain Loc --      Pain Education --      Exclude from Growth Chart --   UFJK(75)@     _________________________________________ Significant initial  Findings: Abnormal Labs Reviewed  CBC WITH DIFFERENTIAL/PLATELET - Abnormal; Notable for the following components:      Result Value   WBC 13.0 (*)    Neutro Abs 10.4 (*)    All other components within normal limits  COMPREHENSIVE METABOLIC PANEL WITH GFR - Abnormal; Notable for the following components:   Glucose, Bld 117 (*)    All other components within normal limits    ECG: Ordered Personally reviewed and interpreted by me showing: HR : 57 Rhythm: Sinus rhythm Abnormal R-wave progression, early transition Inferior infarct, age indeterminate QTC 410  BNP (last 3 results) Recent Labs    09/22/23 0518  BNP 9.7    The recent clinical data is shown below. Vitals:   06/07/24 1827 06/07/24 2015 06/07/24 2030 06/07/24 2233  BP: 121/78 123/71 120/74   Pulse: 76 68 64 69  Resp: 16 14 14 15   Temp: 98.6 F (37 C)   98.1 F (36.7 C)  TempSrc: Oral   Oral  SpO2: 100% 98% 98% 99%      WBC     Component Value Date/Time   WBC 13.0 (H) 06/07/2024 2037   LYMPHSABS 1.6 06/07/2024 2037   MONOABS 0.9 06/07/2024 2037   EOSABS 0.0 06/07/2024 2037   BASOSABS 0.0 06/07/2024 2037     UA not ordered       __________________________________________________________ Recent Labs  Lab 06/07/24 2037  NA 140  K 3.7  CO2  29  GLUCOSE 117*  BUN 13  CREATININE 0.87  CALCIUM  9.3    Cr  stable,    Lab Results  Component Value Date   CREATININE 0.87 06/07/2024   CREATININE 1.03 12/08/2023   CREATININE 1.02 11/17/2023    Recent Labs  Lab 06/07/24 2037  AST 22  ALT 27  ALKPHOS 45  BILITOT 0.5  PROT 6.8  ALBUMIN 4.2   Lab Results  Component Value Date   CALCIUM  9.3 06/07/2024    Plt: Lab Results  Component Value Date   PLT 184 06/07/2024       Recent Labs  Lab 06/07/24 2037  WBC 13.0*  NEUTROABS 10.4*  HGB 15.9  HCT 46.2  MCV 93.1  PLT 184    HG/HCT   stable,     Component Value Date/Time   HGB 15.9 06/07/2024 2037   HGB 16.2 09/14/2020 1535   HCT 46.2 06/07/2024 2037   MCV 93.1 06/07/2024 2037       _______________________________________________ Hospitalist was called for admission for  acute back pain    The following Work up has been ordered so far:  Orders Placed This Encounter  Procedures  . CT Lumbar Spine Wo Contrast  . CT Angio Chest/Abd/Pel for Dissection W and/or W/WO  . CBC with Differential  . Comprehensive metabolic panel  . Consult to hospitalist     OTHER Significant initial  Findings:  labs showing:     DM  labs:  HbA1C: Recent Labs    09/13/23 1516 12/08/23 0924  HGBA1C 5.1 5.4       CBG (last 3)  No results for input(s): GLUCAP in the last 72 hours.        Cultures: No results found for: SDES, SPECREQUEST, CULT, REPTSTATUS   Radiological Exams on Admission: CT Lumbar Spine Wo Contrast Result Date: 06/07/2024 CLINICAL DATA:  Insert stray EXAM: CT LUMBAR SPINE WITHOUT CONTRAST TECHNIQUE: Multidetector CT imaging of the lumbar spine was performed without intravenous contrast administration. Multiplanar CT image reconstructions were also generated. RADIATION DOSE REDUCTION: This exam was performed according to  the departmental dose-optimization program which includes automated exposure control, adjustment of the mA and/or kV according to patient size and/or use of iterative reconstruction technique. COMPARISON:  None Available. FINDINGS: Segmentation: 5 lumbar type vertebrae. Alignment: Normal. Vertebrae: Mild osteophyte formation and moderate facet arthropathy. No associated severe osseous neural foraminal or central canal stenosis. No acute fracture or focal pathologic process. Paraspinal and other soft tissues: Negative. Disc levels: Contained. IMPRESSION: No acute displaced fracture or traumatic listhesis of the lumbar spine. Electronically Signed   By: Morgane  Naveau M.D.   On: 06/07/2024 21:52   CT Angio Chest/Abd/Pel for Dissection W and/or W/WO Result Date: 06/07/2024 CLINICAL DATA:  Acute aortic syndrome (AAS) suspected EXAM: CT ANGIOGRAPHY CHEST, ABDOMEN AND PELVIS TECHNIQUE: Non-contrast CT of the chest was initially obtained. Multidetector CT imaging through the chest, abdomen and pelvis was performed using the standard protocol during bolus administration of intravenous contrast. Multiplanar reconstructed images and MIPs were obtained and reviewed to evaluate the vascular anatomy. RADIATION DOSE REDUCTION: This exam was performed according to the departmental dose-optimization program which includes automated exposure control, adjustment of the mA and/or kV according to patient size and/or use of iterative reconstruction technique. CONTRAST:  OMNIPAQUE  IOHEXOL  350 MG/ML SOLN COMPARISON:  None Available. FINDINGS: CTA CHEST FINDINGS Cardiovascular: Satisfactory opacification of the pulmonary arteries to the segmental level as well as the thoracic aorta. No evidence of pulmonary  embolism. Normal heart size. No significant pericardial effusion. Aneurysmal ascending thoracic aorta measuring up to 4.3 cm. No aortic dissection. No atherosclerotic plaque of the thoracic aorta. No coronary artery  calcifications. Aortic valve leaflet calcifications. The main pulmonary artery is enlarged in caliber measuring up to 3.3 cm. No central or segmental pulmonary embolus. Limited evaluation more distally due to timing of contrast. Mediastinum/Nodes: No enlarged mediastinal, hilar, or axillary lymph nodes. Thyroid  gland, trachea, and esophagus demonstrate no significant findings. Lungs/Pleura: Azygous fissure noted. No focal consolidation. No pulmonary nodule. No pulmonary mass. No pleural effusion. No pneumothorax. Musculoskeletal: No chest wall abnormality. No suspicious lytic or blastic osseous lesions. No acute displaced fracture. Review of the MIP images confirms the above findings. CTA ABDOMEN AND PELVIS FINDINGS VASCULAR Aorta: Normal caliber aorta without aneurysm, dissection, vasculitis or significant stenosis. Celiac: Patent without evidence of aneurysm, dissection, vasculitis or significant stenosis. SMA: Patent without evidence of aneurysm, dissection, vasculitis or significant stenosis. Renals: Both renal arteries are patent without evidence of aneurysm, dissection, vasculitis, fibromuscular dysplasia or significant stenosis. IMA: Patent without evidence of aneurysm, dissection, vasculitis or significant stenosis. Inflow: Patent without evidence of aneurysm, dissection, vasculitis or significant stenosis. Veins: No obvious venous abnormality within the limitations of this arterial phase study. Review of the MIP images confirms the above findings. NON-VASCULAR Hepatobiliary: No focal liver abnormality. No gallstones, gallbladder wall thickening, or pericholecystic fluid. No biliary dilatation. Pancreas: No focal lesion. Normal pancreatic contour. No surrounding inflammatory changes. No main pancreatic ductal dilatation. Spleen: Normal in size without focal abnormality. Adrenals/Urinary Tract: No adrenal nodule bilaterally. Bilateral kidneys enhance symmetrically. No hydronephrosis. No hydroureter. The  urinary bladder is unremarkable. Stomach/Bowel: Stomach is within normal limits. No evidence of bowel wall thickening or dilatation. Colonic diverticulosis. Appendix appears normal. Lymphatic: No lymphadenopathy. Reproductive: Prostate is unremarkable. Other: No intraperitoneal free fluid. No intraperitoneal free gas. No organized fluid collection. Musculoskeletal: No abdominal wall hernia or abnormality. No suspicious lytic or blastic osseous lesions. No acute displaced fracture. Please see separately dictated CT lumbar spine 06/07/2024. Review of the MIP images confirms the above findings. IMPRESSION: 1. Aneurysmal ascending thoracic aorta (4.3 cm). Recommend annual imaging followup by CTA or MRA. This recommendation follows 2010 ACCF/AHA/AATS/ACR/ASA/SCA/SCAI/SIR/STS/SVM Guidelines for the Diagnosis and Management of Patients with Thoracic Aortic Disease. Circulation. 2010; 121: Z733-z630. Aortic aneurysm NOS (ICD10-I71.9). 2. Aortic valve leaflet calcifications-correlate for aortic stenosis. 3. No central or segmental pulmonary embolus. Limited evaluation more distally. 4. Enlarged main pulmonary artery-correlate for pulmonary hypertension. 5. Colonic diverticulosis with no acute diverticulitis. Electronically Signed   By: Morgane  Naveau M.D.   On: 06/07/2024 21:51   _______________________________________________________________________________________________________ Latest  Blood pressure 120/74, pulse 69, temperature 98.1 F (36.7 C), temperature source Oral, resp. rate 15, SpO2 99%.   Vitals  labs and radiology finding personally reviewed  Review of Systems:    Pertinent positives include: ***  Constitutional:  No weight loss, night sweats, Fevers, chills, fatigue, weight loss  HEENT:  No headaches, Difficulty swallowing,Tooth/dental problems,Sore throat,  No sneezing, itching, ear ache, nasal congestion, post nasal drip,  Cardio-vascular:  No chest pain, Orthopnea, PND, anasarca,  dizziness, palpitations.no Bilateral lower extremity swelling  GI:  No heartburn, indigestion, abdominal pain, nausea, vomiting, diarrhea, change in bowel habits, loss of appetite, melena, blood in stool, hematemesis Resp:  no shortness of breath at rest. No dyspnea on exertion, No excess mucus, no productive cough, No non-productive cough, No coughing up of blood.No change in color of mucus.No wheezing. Skin:  no rash  or lesions. No jaundice GU:  no dysuria, change in color of urine, no urgency or frequency. No straining to urinate.  No flank pain.  Musculoskeletal:  No joint pain or no joint swelling. No decreased range of motion. No back pain.  Psych:  No change in mood or affect. No depression or anxiety. No memory loss.  Neuro: no localizing neurological complaints, no tingling, no weakness, no double vision, no gait abnormality, no slurred speech, no confusion  All systems reviewed and apart from HOPI all are negative _______________________________________________________________________________________________ Past Medical History:   Past Medical History:  Diagnosis Date  . ADHD   . Bipolar 1 disorder (HCC)   . CAD (coronary artery disease)   . 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 Fatty liver    stage 2 fibrosis  . Metabolic dysfunction-associated steatohepatitis (MASH)    2021 severe fibrosis (hepatologist)  . NSTEMI (non-ST elevated myocardial infarction) (HCC) 09/2023   DES RCA  . Polycythemia    2021 hem/onc eval-->?spurius/?transient.  Iron normal  . Ruptured plantar fascia    left, 2022      Past Surgical History:  Procedure Laterality Date  . CORONARY STENT INTERVENTION N/A 09/14/2023   Procedure: CORONARY STENT INTERVENTION;  Surgeon: Ladona Heinz, MD;  Location: [facility] INVASIVE CV LAB;  Service: Cardiovascular;  Laterality: N/A;  . EXTERNAL EAR SURGERY Right    trauma (bit off)  . LEFT HEART CATH AND CORONARY  ANGIOGRAPHY N/A 09/14/2023   Procedure: LEFT HEART CATH AND CORONARY ANGIOGRAPHY;  Surgeon: Ladona Heinz, MD;  Location: [facility] INVASIVE CV LAB;  Service: Cardiovascular;  Laterality: N/A;  . LEFT HEART CATH AND CORONARY ANGIOGRAPHY N/A 09/22/2023   Procedure: LEFT HEART CATH AND CORONARY ANGIOGRAPHY;  Surgeon: Wonda Sharper, MD;  Location: [facility] INVASIVE CV LAB;  Service: Cardiovascular;  Laterality: N/A;    Social History:  Ambulatory   independently      reports that he has never smoked. He has never used smokeless tobacco. He reports that he does not currently use alcohol. He reports that he does not use drugs.   Family History:   Family History  Problem Relation Age of Onset  . Rectal cancer Mother   . Cancer Mother 4       Cholangiocarcinoma  . Heart disease Father 57       CABG  . Thyroid  disease Father   . Heart disease Maternal Grandmother   . Heart attack Maternal Grandfather   . Crohn's disease Paternal Grandmother   . Stroke Paternal Grandfather   . Heart failure Paternal Grandfather   . Diabetes Paternal Aunt   . Diabetes Paternal Uncle   . Lactose intolerance Daughter    ______________________________________________________________________________________________ Allergies: No Known Allergies   Prior to Admission medications   Medication Sig Start Date End Date Taking? Authorizing Provider  ASPIRIN  LOW DOSE 81 MG chewable tablet chew ONE TABLET BY MOUTH ONCE DAILY 04/13/24   Nahser, Aleene PARAS, MD  atomoxetine  (STRATTERA ) 80 MG capsule Take 80 mg by mouth daily. 09/01/23   [provider]  CAPLYTA  42 MG capsule Take 42 mg by mouth at bedtime. 09/01/23   [provider]  clopidogrel  (PLAVIX ) 75 MG tablet TAKE ONE TABLET BY MOUTH EVERY DAY WITH BREAKFAST 03/01/24   Nahser, Aleene PARAS, MD  cyclobenzaprine  (FLEXERIL ) 10 MG tablet Take 1 tablet (10 mg total) by mouth 2 (two) times daily as needed for muscle spasms. 06/06/24   Lang,  John K, [name]   HYDROcodone -acetaminophen  (NORCO/VICODIN) 5-325 MG tablet Take 1 tablet by mouth every 6 (six) hours as needed. 06/06/24   Robinson, John K, [name]  lamoTRIgine  (LAMICTAL ) 200 MG tablet Take 200 mg by mouth daily. 11/23/23   [provider]  metoprolol  tartrate (LOPRESSOR ) 25 MG tablet Take 1 tablet (25 mg total) by mouth 2 (two) times daily. 01/04/24   Nahser, Aleene PARAS, MD  Misc Natural Products (AIRBORNE ELDERBERRY) 100-50 MG CHEW Chew 2 tablets by mouth daily.    [provider]  nitroGLYCERIN  (NITROSTAT ) 0.4 MG SL tablet Place 1 tablet (0.4 mg total) under the tongue every 5 (five) minutes x 3 doses as needed for chest pain. 09/14/23   Ladona Heinz, MD  Omega Fatty Acids-Vitamins (OMEGA-3 GUMMIES) CHEW Chew 2 tablets by mouth daily. [employer]    [provider]  predniSONE  (DELTASONE ) 10 MG tablet Take 2 tablets (20 mg total) by mouth daily for 5 days. 06/06/24 06/11/24  Lang Norleen POUR, [name]  Resmetirom  (REZDIFFRA ) 80 MG TABS Take 1 tablet by mouth daily. Sent to [facility] Patient Support fax [phone]; phone [phone] Patient not taking: Reported on 05/17/2024 05/05/24   Mollie Nestor HERO, [name]  rosuvastatin  (CRESTOR ) 40 MG tablet TAKE ONE TABLET BY MOUTH EVERY DAY 11/03/23   Lelon Hamilton T, [name]    ___________________________________________________________________________________________________ Physical Exam:    06/07/2024   10:33 PM 06/07/2024    8:30 PM 06/07/2024    8:15 PM  Vitals with BMI  Systolic  120 123  Diastolic  74 71  Pulse 69 64 68     1. General:  in No ***Acute distress***increased work of breathing ***complaining of severe pain****agitated * Chronically ill *well *cachectic *toxic acutely ill -appearing 2. Psychological: Alert and *** Oriented 3. Head/ENT:   Moist *** Dry Mucous Membranes                          Head Non traumatic, neck supple                          Normal *** Poor Dentition 4. SKIN: normal *** decreased Skin turgor,   Skin clean Dry and intact no rash    5. Heart: Regular rate and rhythm no*** Murmur, no Rub or gallop 6. Lungs: ***Clear to auscultation bilaterally, no wheezes or crackles   7. Abdomen: Soft, ***non-tender, Non distended *** obese ***bowel sounds present 8. Lower extremities: no clubbing, cyanosis, no ***edema 9. Neurologically Grossly intact, moving all 4 extremities equally *** strength 5 out of 5 in all 4 extremities cranial nerves II through XII intact 10. MSK: Normal range of motion    Chart has been reviewed  ______________________________________________________________________________________________  Assessment/Plan 48 y.o. male with medical history significant of CAD, NSTEMI, ascending aortic dilation.   Admitted for  acute back pain    Present on Admission: **None**     No problem-specific Assessment & Plan notes found for this encounter.    Other plan as per orders.  DVT prophylaxis:  SCD     Code Status:    Code Status: Prior FULL CODE *** DNR/DNI ***comfort care as per patient ***family  I had personally discussed CODE STATUS with patient and family*  ACP *** none has been reviewed ***   Family Communication:   Family not at  Bedside  plan of care was discussed on the phone with *** Son, Daughter, Wife, Husband, Sister, Brother ,  father, mother  Diet    Disposition Plan:   *** likely will need placement for rehabilitation                          Back to current facility when stable                            To home once workup is complete and patient is stable  ***Following barriers for discharge:                             Chest pain *** Stroke *** Syncope ***work up is complete                            Electrolytes corrected                               Anemia corrected h/H stable                             Pain controlled with PO medications                               Afebrile, white count improving able to transition to PO antibiotics                              Will need to be able to tolerate PO                            Will likely need home health, home O2, set up                           Will need consultants to evaluate patient prior to discharge                           Work of breathing improves       Consult Orders  (From admission, onward)           Start     Ordered   06/07/24 2319  Consult to hospitalist  Once       Provider:  (Not yet assigned)  Question Answer Comment  Place call to: [facility]   Reason for Consult Admit      06/07/24 2318                              ***Would benefit from PT/OT eval prior to DC  Ordered                   Swallow eval - SLP ordered                   Diabetes care coordinator                   Transition of care consulted                   Nutrition  consulted                  Wound care  consulted                   Palliative care    consulted                   Behavioral health  consulted                    Consults called: ***  NONE   Admission status:  ED Disposition     ED Disposition  Admit   Condition  --   Comment  The patient appears reasonably stabilized for admission considering the current resources, flow, and capabilities available in the ED at this time, and I doubt any other [facility] requiring further screening and/or treatment in the ED prior to admission is  present.           Obs***  ***  inpatient     I Expect 2 midnight stay secondary to severity of patient's current illness need for inpatient interventions justified by the following: ***hemodynamic instability despite optimal treatment (tachycardia *hypotension * tachypnea *hypoxia, hypercapnia) *** Severe lab/radiological/exam abnormalities including:    There are no diagnoses linked to this encounter. and extensive comorbidities including: *substance abuse  *Chronic pain *DM2  * CHF * CAD  * COPD/asthma *Morbid Obesity * CKD *dementia *liver  disease *history of stroke with residual deficits *  malignancy, * sickle cell disease  History of amputation Chronic anticoagulation  That are currently affecting medical management.   I expect  patient to be hospitalized for 2 midnights requiring inpatient medical care.  Patient is at high risk for adverse outcome (such as loss of life or disability) if not treated.  Indication for inpatient stay as follows:  Severe change from baseline regarding mental status Hemodynamic instability despite maximal medical therapy,  severe pain requiring acute inpatient management,  inability to maintain oral hydration   persistent chest pain despite medical management Need for operative/procedural  intervention New or worsening hypoxia ongoing suicidal ideations   Need for IV antibiotics, IV fluids,, IV pain medications, IV anticoagulation,  IV rate controling medications, IV antihypertensives need for biPAP Frequent labs    Level of care   *** tele  For 12H 24H     medical floor       progressive     stepdown   tele indefinitely please discontinue once patient no longer qualifies COVID-19 Labs    Critical***  Patient is critically ill due to  hemodynamic instability * respiratory failure *severe sepsis* ongoing chest pain*  They are at high risk for life/limb threatening clinical deterioration requiring frequent reassessment and modifications of care.  Services provided include examination of the patient, review of relevant ancillary tests, prescription of lifesaving therapies, review of medications and prophylactic therapy.  Total critical care time excluding separately billable procedures: 60*  Minutes.    Hervey Wedig 06/07/2024, 11:27 PM ***  [facility]     after 2 AM please page floor coverage   If 7AM-7PM, please contact the day team taking care of the patient using [email or web address]

## 2024-06-07 NOTE — Subjective & Objective (Addendum)
 Severe back pain Unable to stand or walk, severe back pain after bending over to pick up something bP 138/86 HR 85

## 2024-06-08 ENCOUNTER — Encounter (HOSPITAL_COMMUNITY): Payer: Self-pay | Admitting: Internal Medicine

## 2024-06-08 ENCOUNTER — Observation Stay (HOSPITAL_COMMUNITY)

## 2024-06-08 ENCOUNTER — Other Ambulatory Visit: Payer: Self-pay

## 2024-06-08 DIAGNOSIS — F909 Attention-deficit hyperactivity disorder, unspecified type: Secondary | ICD-10-CM | POA: Diagnosis present

## 2024-06-08 DIAGNOSIS — I272 Pulmonary hypertension, unspecified: Secondary | ICD-10-CM | POA: Diagnosis not present

## 2024-06-08 DIAGNOSIS — M5441 Lumbago with sciatica, right side: Secondary | ICD-10-CM | POA: Diagnosis not present

## 2024-06-08 DIAGNOSIS — F319 Bipolar disorder, unspecified: Secondary | ICD-10-CM | POA: Diagnosis present

## 2024-06-08 LAB — ECHOCARDIOGRAM COMPLETE
AR max vel: 3.62 cm2
AV Area VTI: 3.75 cm2
AV Area mean vel: 3.58 cm2
AV Mean grad: 6 mmHg
AV Peak grad: 11.6 mmHg
Ao pk vel: 1.7 m/s
Area-P 1/2: 3.77 cm2
Calc EF: 66 %
Height: 74.016 in
MV M vel: 4.79 m/s
MV Peak grad: 91.8 mmHg
S' Lateral: 3.3 cm
Single Plane A2C EF: 67 %
Single Plane A4C EF: 61.9 %
Weight: 3418.01 [oz_av]

## 2024-06-08 LAB — COMPREHENSIVE METABOLIC PANEL WITH GFR
ALT: 23 U/L (ref 0–44)
AST: 17 U/L (ref 15–41)
Albumin: 3.8 g/dL (ref 3.5–5.0)
Alkaline Phosphatase: 42 U/L (ref 38–126)
Anion gap: 10 (ref 5–15)
BUN: 13 mg/dL (ref 6–20)
CO2: 27 mmol/L (ref 22–32)
Calcium: 8.8 mg/dL — ABNORMAL LOW (ref 8.9–10.3)
Chloride: 102 mmol/L (ref 98–111)
Creatinine, Ser: 0.83 mg/dL (ref 0.61–1.24)
GFR, Estimated: >60 mL/min (ref >60)
Glucose, Bld: 120 mg/dL — ABNORMAL HIGH (ref 70–99)
Potassium: 3.5 mmol/L (ref 3.5–5.1)
Sodium: 139 mmol/L (ref 135–145)
Total Bilirubin: 0.4 mg/dL (ref 0.0–1.2)
Total Protein: 6.2 g/dL — ABNORMAL LOW (ref 6.5–8.1)

## 2024-06-08 LAB — CBC
HCT: 45.7 % (ref 39.0–52.0)
Hemoglobin: 15.6 g/dL (ref 13.0–17.0)
MCH: 32.6 pg (ref 26.0–34.0)
MCHC: 34.1 g/dL (ref 30.0–36.0)
MCV: 95.4 fL (ref 80.0–100.0)
Platelets: 161 K/uL (ref 150–400)
RBC: 4.79 MIL/uL (ref 4.22–5.81)
RDW: 12.4 % (ref 11.5–15.5)
WBC: 8.8 K/uL (ref 4.0–10.5)
nRBC: 0 % (ref 0.0–0.2)

## 2024-06-08 LAB — MAGNESIUM: Magnesium: 2.1 mg/dL (ref 1.7–2.4)

## 2024-06-08 LAB — PHOSPHORUS: Phosphorus: 4.1 mg/dL (ref 2.5–4.6)

## 2024-06-08 MED ORDER — PREDNISONE 20 MG PO TABS
20.0000 mg | ORAL_TABLET | Freq: Every day | ORAL | Status: DC
  Administered 2024-06-08 – 2024-06-10 (×3): 20 mg via ORAL
  Filled 2024-06-08 (×3): qty 1

## 2024-06-08 MED ORDER — LAMOTRIGINE 100 MG PO TABS
200.0000 mg | ORAL_TABLET | Freq: Two times a day (BID) | ORAL | Status: DC
  Administered 2024-06-08 – 2024-06-10 (×5): 200 mg via ORAL
  Filled 2024-06-08 (×6): qty 2

## 2024-06-08 MED ORDER — SENNA 8.6 MG PO TABS
1.0000 | ORAL_TABLET | Freq: Every day | ORAL | Status: DC
  Administered 2024-06-08 – 2024-06-10 (×3): 8.6 mg via ORAL
  Filled 2024-06-08 (×4): qty 1

## 2024-06-08 MED ORDER — SODIUM CHLORIDE 0.9 % IV SOLN: INTRAVENOUS | Status: AC

## 2024-06-08 MED ORDER — HYDROCODONE-ACETAMINOPHEN 5-325 MG PO TABS
1.0000 | ORAL_TABLET | ORAL | Status: DC | PRN
  Administered 2024-06-08: 2 via ORAL
  Administered 2024-06-08: 1 via ORAL
  Administered 2024-06-08 – 2024-06-09 (×5): 2 via ORAL
  Filled 2024-06-08 (×3): qty 2
  Filled 2024-06-08: qty 1
  Filled 2024-06-08 (×3): qty 2

## 2024-06-08 MED ORDER — LUMATEPERONE TOSYLATE 42 MG PO CAPS
42.0000 mg | ORAL_CAPSULE | Freq: Every day | ORAL | Status: DC
  Administered 2024-06-08 – 2024-06-09 (×3): 42 mg via ORAL
  Filled 2024-06-08 (×3): qty 1

## 2024-06-08 MED ORDER — LACTULOSE 10 GM/15ML PO SOLN: 10.0000 g | Freq: Two times a day (BID) | ORAL | Status: DC | PRN

## 2024-06-08 MED ORDER — METOPROLOL TARTRATE 12.5 MG HALF TABLET
12.5000 mg | ORAL_TABLET | Freq: Two times a day (BID) | ORAL | Status: DC
  Administered 2024-06-08 – 2024-06-10 (×5): 12.5 mg via ORAL
  Filled 2024-06-08 (×5): qty 1

## 2024-06-08 MED ORDER — ACETAMINOPHEN 650 MG RE SUPP: 650.0000 mg | Freq: Four times a day (QID) | RECTAL | Status: DC | PRN

## 2024-06-08 MED ORDER — ROSUVASTATIN CALCIUM 20 MG PO TABS
40.0000 mg | ORAL_TABLET | Freq: Every day | ORAL | Status: DC
  Administered 2024-06-08 – 2024-06-10 (×3): 40 mg via ORAL
  Filled 2024-06-08 (×3): qty 2

## 2024-06-08 MED ORDER — POLYETHYLENE GLYCOL 3350 17 G PO PACK
17.0000 g | PACK | Freq: Every day | ORAL | Status: DC | PRN
  Administered 2024-06-08 – 2024-06-09 (×2): 17 g via ORAL
  Filled 2024-06-08 (×2): qty 1

## 2024-06-08 MED ORDER — ASPIRIN 81 MG PO CHEW
81.0000 mg | CHEWABLE_TABLET | Freq: Once | ORAL | Status: AC
  Administered 2024-06-08: 81 mg via ORAL
  Filled 2024-06-08: qty 1

## 2024-06-08 MED ORDER — DOCUSATE SODIUM 100 MG PO CAPS
100.0000 mg | ORAL_CAPSULE | Freq: Two times a day (BID) | ORAL | Status: DC
  Administered 2024-06-08 – 2024-06-10 (×6): 100 mg via ORAL
  Filled 2024-06-08 (×6): qty 1

## 2024-06-08 MED ORDER — ONDANSETRON HCL 4 MG/2ML IJ SOLN: 4.0000 mg | Freq: Four times a day (QID) | INTRAMUSCULAR | Status: DC | PRN

## 2024-06-08 MED ORDER — ONDANSETRON HCL 4 MG PO TABS: 4.0000 mg | ORAL_TABLET | Freq: Four times a day (QID) | ORAL | Status: DC | PRN

## 2024-06-08 MED ORDER — CLOPIDOGREL BISULFATE 75 MG PO TABS
75.0000 mg | ORAL_TABLET | Freq: Every day | ORAL | Status: DC
  Administered 2024-06-08 – 2024-06-10 (×3): 75 mg via ORAL
  Filled 2024-06-08 (×3): qty 1

## 2024-06-08 MED ORDER — HYDROMORPHONE HCL 1 MG/ML IJ SOLN: 0.5000 mg | INTRAMUSCULAR | Status: DC | PRN

## 2024-06-08 MED ORDER — CYCLOBENZAPRINE HCL 10 MG PO TABS
10.0000 mg | ORAL_TABLET | Freq: Two times a day (BID) | ORAL | Status: DC | PRN
  Administered 2024-06-08 – 2024-06-10 (×5): 10 mg via ORAL
  Filled 2024-06-08 (×6): qty 1

## 2024-06-08 MED ORDER — ATOMOXETINE HCL 40 MG PO CAPS
80.0000 mg | ORAL_CAPSULE | Freq: Every day | ORAL | Status: DC
  Administered 2024-06-08 – 2024-06-10 (×3): 80 mg via ORAL
  Filled 2024-06-08 (×3): qty 2

## 2024-06-08 MED ORDER — ACETAMINOPHEN 325 MG PO TABS
650.0000 mg | ORAL_TABLET | Freq: Four times a day (QID) | ORAL | Status: DC | PRN
  Administered 2024-06-10: 650 mg via ORAL
  Filled 2024-06-08: qty 2

## 2024-06-08 MED ORDER — GADOBUTROL 1 MMOL/ML IV SOLN
10.0000 mL | Freq: Once | INTRAVENOUS | Status: AC | PRN
  Administered 2024-06-08: 10 mL via INTRAVENOUS

## 2024-06-08 MED ORDER — KETOROLAC TROMETHAMINE 15 MG/ML IJ SOLN
15.0000 mg | Freq: Four times a day (QID) | INTRAMUSCULAR | Status: AC | PRN
  Administered 2024-06-08 – 2024-06-09 (×3): 15 mg via INTRAVENOUS
  Filled 2024-06-08 (×4): qty 1

## 2024-06-08 NOTE — Assessment & Plan Note (Signed)
 Continue aspirin  81 mg daily continue Plavix  75 mg daily continue Lopressor  crease down to 12.5 mg twice daily given bradycardia continue Crestor  40 mg daily

## 2024-06-08 NOTE — TOC Initial Note (Signed)
 Transition of Care [facility]) - Initial/Assessment Note    Patient Details  Name: Billy Andersen MRN: [id number] Date of Birth: 23-Mar-1976  Transition of [facility]) CM/SW Contact:    Alfonse JONELLE Rex, RN Phone Number: 06/08/2024, 12:22 PM  Clinical Narrative:   Met with patient at bedside to introduce role of TOC/NCM and review for dc planning, pt confirmed he has an established PCP, no current home care services or home DME, pt reports he feels safe returning home with support from his spouse. TOC will continue to follow.                 Expected Discharge Plan: Home/Self Care Barriers to Discharge: Continued Medical Work up   Patient Goals and CMS Choice Patient states their goals for this hospitalization and ongoing recovery are:: return home          Expected Discharge Plan and Services       Living arrangements for the past 2 months: Single Family Home                                      Prior Living Arrangements/Services Living arrangements for the past 2 months: Single Family Home Lives with:: Spouse Patient language and need for interpreter reviewed:: Yes Do you feel safe going back to the place where you live?: Yes      Need for Family Participation in Patient Care: Yes (Comment) Care giver support system in place?: Yes (comment)   Criminal Activity/Legal Involvement Pertinent to Current Situation/Hospitalization: No - Comment as needed  Activities of Daily Living   ADL Screening (condition at time of admission) Independently performs ADLs?: No Does the patient have a NEW difficulty with bathing/dressing/toileting/self-feeding that is expected to last >3 days?: Yes (Initiates electronic notice to provider for possible OT consult) Does the patient have a NEW difficulty with getting in/out of bed, walking, or climbing stairs that is expected to last >3 days?: Yes (Initiates electronic notice to provider for possible PT consult) Does the patient have a  NEW difficulty with communication that is expected to last >3 days?: No Is the patient deaf or have difficulty hearing?: No Does the patient have difficulty seeing, even when wearing glasses/contacts?: No Does the patient have difficulty concentrating, remembering, or making decisions?: No  Permission Sought/Granted                  Emotional Assessment Appearance:: Appears stated age Attitude/Demeanor/Rapport: Gracious Affect (typically observed): Accepting Orientation: : Oriented to Self, Oriented to Place, Oriented to  Time, Oriented to Situation Alcohol / Substance Use: Not Applicable Psych Involvement: No (comment)  Admission diagnosis:  Acute back pain [M54.9] Acute bilateral low back pain with bilateral sciatica [M54.42, M54.41] Patient Active Problem List   Diagnosis Date Noted   ADHD 06/08/2024   Bipolar disorder (HCC) 06/08/2024   Acute back pain 06/07/2024   History of multiple concussions 04/03/2024   Cognitive and behavioral changes 04/03/2024   Ascending aorta dilation (HCC) 10/02/2023   Pure hypercholesterolemia 09/22/2023   Chest pain 09/21/2023   Dyspnea 09/21/2023   Non-ST elevation (NSTEMI) myocardial infarction [facility]) 09/15/2023   CAD (coronary artery disease) 09/15/2023   PCP:  [name], MD Pharmacy:   [facility] [place], [place] - [address] [place] [address] Phone: [phone] Fax: [phone]     Social  Drivers of Health (SDOH) Social History: SDOH Screenings   Food Insecurity: No Food Insecurity (06/08/2024)  Housing: Low Risk  (06/08/2024)  Transportation Needs: No Transportation Needs (06/08/2024)  Utilities: Not At Risk (06/08/2024)  Depression (PHQ2-9): Medium Risk (12/29/2023)  Financial Resource Strain: Low Risk  (12/29/2023)  Physical Activity: Insufficiently Active (12/29/2023)  Social Connections: Moderately Integrated (12/29/2023)  Stress: No Stress Concern Present (12/29/2023)  Tobacco  Use: Low Risk  (06/06/2024)   SDOH Interventions:     Readmission Risk Interventions     No data to display

## 2024-06-08 NOTE — Assessment & Plan Note (Signed)
Chronic stable follow-up as an outpatient 

## 2024-06-08 NOTE — ED Notes (Signed)
Patient resting in bed at this time with wife at bedside.

## 2024-06-08 NOTE — ED Notes (Signed)
 Patient ok to go to floor at this time per Rupinder, Charity fundraiser.

## 2024-06-08 NOTE — Plan of Care (Signed)
  Problem: Elimination: Goal: Will not experience complications related to bowel motility Outcome: Progressing Goal: Will not experience complications related to urinary retention Outcome: Progressing   Problem: Pain Managment: Goal: General experience of comfort will improve and/or be controlled Outcome: Progressing

## 2024-06-08 NOTE — ED Notes (Signed)
 Patient resting in bed at this time.

## 2024-06-08 NOTE — Hospital Course (Addendum)
 48 year old male/PMH of CAD, mild cognitive decline, bipolar, ADHD admitted for severe acute back pain.  Workup with CT scan and MRI of the back did not show any fracture or severe stenosis.  Patient has new pulmonary hypertension and echocardiogram has been done and waiting for the result.  Patient to continue pain medications, muscle relaxant and PT OT. Due to persistent pain I did discuss with Dr. Colon from neurosurgery who came and evaluated the patient and offered him possible steroid injection.  But patient felt better today and expressed wishes not to do anything else at this point and go home.  Today his pain is rated around 3-4/10 and was able to walk around okay no problems.  He will be discharged home on his home medication.  He will be given Tylenol  and Flexeril  prescription.  He has prescription for steroid at home.  He should not take more than 5 days of steroid.  That was explained to the patient and he understood.

## 2024-06-08 NOTE — Assessment & Plan Note (Signed)
 Hold Concerta  for today

## 2024-06-08 NOTE — Assessment & Plan Note (Signed)
 Supportive measures, suspect possible radiculopathy given burning sensation Possible disc herniation. Obtain MRI lumbar spine pain management

## 2024-06-08 NOTE — Progress Notes (Signed)
   06/08/24 1026  TOC Brief Assessment  Insurance and Status Reviewed  Patient has primary care physician Yes  Home environment has been reviewed resides in private residence  Prior level of function: Independent  Prior/Current Home Services No current home services  Social Drivers of Health Review SDOH reviewed no interventions necessary  Readmission risk has been reviewed Yes  Transition of care needs no transition of care needs at this time

## 2024-06-08 NOTE — Progress Notes (Signed)
  Progress Note   Patient: Billy Andersen FMW:[id number] DOB: Sep 20, 1976 DOA: 06/07/2024     0 DOS: the patient was seen and examined on 06/08/2024   Brief hospital course: 48 year old male/PMH of CAD, mild cognitive decline, bipolar, ADHD admitted for severe acute back pain.  Workup with CT scan and MRI of the back did not show any fracture or severe stenosis.  Patient has new pulmonary hypertension and echocardiogram has been done and waiting for the result.  Patient to continue pain medications, muscle relaxant and PT OT.  Assessment and Plan:  Acute back pain Supportive measures, suspect possible radiculopathy given burning sensation Possible disc herniation. CT and MRI lumbar spine with no acute finding, Continue pain management, PT/OT   Ascending aorta dilation (HCC) Chronic stable follow-up as an outpatient   CAD (coronary artery disease) Continue aspirin  81 mg daily continue Plavix  75 mg daily continue Lopressor  crease down to 12.5 mg twice daily given bradycardia continue Crestor  40 mg daily   Pure hypercholesterolemia Continue Crestor  40 mg daily   ADHD Hold Concerta  for today   Bipolar disorder (HCC) Chronic stable continue Lamictal  and Caplyta   Pulmonary hypertension Echocardiogram      Subjective: Back pain  Physical Exam: Vitals:   06/08/24 0931 06/08/24 1100 06/08/24 1102 06/08/24 1336  BP: 117/63 110/64  117/71  Pulse: 60   82  Resp: 17 20  17   Temp: 98.1 F (36.7 C)   97.7 F (36.5 C)  TempSrc: Oral     SpO2: 100% (S) (!) 84% 95% 100%  Weight:      Height:       Constitutional: Alert, awake, calm, comfortable HEENT: Neck supple Respiratory: clear to auscultation bilaterally, no wheezing, no crackles. Normal respiratory effort. No accessory muscle use.  Cardiovascular: Regular rate and rhythm, no murmurs / rubs / gallops. No extremity edema. 2+ pedal pulses. No carotid bruits.  Abdomen: no tenderness, no masses palpated. No  hepatosplenomegaly. Bowel sounds positive.  Musculoskeletal: no clubbing / cyanosis. No joint deformity upper and lower extremities. Good ROM, no contractures. Normal muscle tone.  Skin: no rashes, lesions, ulcers. No induration Neurologic: CN 2-12 grossly intact. Sensation intact, DTR normal. Strength 5/5 x all 4 extremities.  Psychiatric: Normal judgment and insight. Alert and oriented x 3. Normal mood.   Data Reviewed:  White count was normal at 8.8 and potassium was 3.5  Family Communication: None available  Disposition: Status is: Observation The patient remains OBS appropriate and will d/c before 2 midnights.  Planned Discharge Destination: Home    Time spent: 35 minutes  Author: Nena Rebel, MD 06/08/2024 4:56 PM  For on call review [email or web address].

## 2024-06-08 NOTE — Assessment & Plan Note (Signed)
 Chronic stable continue Lamictal  and Caplyta 

## 2024-06-08 NOTE — Progress Notes (Signed)
 Ambulated patient 100 feet in the hallway with RW. Needed RW for steadying assist, pt O2 dropped to 84% with associated shortness of breath. We stopped walking and he took deep breaths and O2 came up to 90% after about 1 minute. He did say he felt discomfort taking deep breaths and felt winded while walking. This is not normal for him. Notified MD Paudel. Will continue to monitor. Pt is 95% on room air now sitting up in bed.

## 2024-06-08 NOTE — Assessment & Plan Note (Signed)
 Continue Crestor 40 mg daily

## 2024-06-08 NOTE — Plan of Care (Signed)

## 2024-06-08 NOTE — Progress Notes (Signed)
*  PRELIMINARY RESULTS* Echocardiogram 2D Echocardiogram has been performed.  Billy Andersen Stallion 06/08/2024, 3:54 PM

## 2024-06-09 DIAGNOSIS — M5442 Lumbago with sciatica, left side: Secondary | ICD-10-CM | POA: Diagnosis present

## 2024-06-09 DIAGNOSIS — F909 Attention-deficit hyperactivity disorder, unspecified type: Secondary | ICD-10-CM | POA: Diagnosis present

## 2024-06-09 DIAGNOSIS — F319 Bipolar disorder, unspecified: Secondary | ICD-10-CM | POA: Diagnosis present

## 2024-06-09 DIAGNOSIS — Z823 Family history of stroke: Secondary | ICD-10-CM | POA: Diagnosis not present

## 2024-06-09 DIAGNOSIS — E7800 Pure hypercholesterolemia, unspecified: Secondary | ICD-10-CM | POA: Diagnosis present

## 2024-06-09 DIAGNOSIS — Z955 Presence of coronary angioplasty implant and graft: Secondary | ICD-10-CM | POA: Diagnosis not present

## 2024-06-09 DIAGNOSIS — I252 Old myocardial infarction: Secondary | ICD-10-CM | POA: Diagnosis not present

## 2024-06-09 DIAGNOSIS — M549 Dorsalgia, unspecified: Secondary | ICD-10-CM | POA: Diagnosis present

## 2024-06-09 DIAGNOSIS — M5440 Lumbago with sciatica, unspecified side: Secondary | ICD-10-CM

## 2024-06-09 DIAGNOSIS — Z8249 Family history of ischemic heart disease and other diseases of the circulatory system: Secondary | ICD-10-CM | POA: Diagnosis not present

## 2024-06-09 DIAGNOSIS — S39012A Strain of muscle, fascia and tendon of lower back, initial encounter: Secondary | ICD-10-CM | POA: Diagnosis present

## 2024-06-09 DIAGNOSIS — I7121 Aneurysm of the ascending aorta, without rupture: Secondary | ICD-10-CM | POA: Diagnosis present

## 2024-06-09 DIAGNOSIS — K7581 Nonalcoholic steatohepatitis (NASH): Secondary | ICD-10-CM | POA: Diagnosis present

## 2024-06-09 DIAGNOSIS — X58XXXA Exposure to other specified factors, initial encounter: Secondary | ICD-10-CM | POA: Diagnosis present

## 2024-06-09 DIAGNOSIS — I2510 Atherosclerotic heart disease of native coronary artery without angina pectoris: Secondary | ICD-10-CM | POA: Diagnosis present

## 2024-06-09 DIAGNOSIS — I10 Essential (primary) hypertension: Secondary | ICD-10-CM | POA: Diagnosis present

## 2024-06-09 DIAGNOSIS — M5441 Lumbago with sciatica, right side: Secondary | ICD-10-CM | POA: Diagnosis present

## 2024-06-09 DIAGNOSIS — K5900 Constipation, unspecified: Secondary | ICD-10-CM | POA: Diagnosis present

## 2024-06-09 DIAGNOSIS — Z7902 Long term (current) use of antithrombotics/antiplatelets: Secondary | ICD-10-CM | POA: Diagnosis not present

## 2024-06-09 DIAGNOSIS — Z79899 Other long term (current) drug therapy: Secondary | ICD-10-CM | POA: Diagnosis not present

## 2024-06-09 DIAGNOSIS — Z7982 Long term (current) use of aspirin: Secondary | ICD-10-CM | POA: Diagnosis not present

## 2024-06-09 NOTE — Evaluation (Signed)
 Physical Therapy Evaluation Patient Details Name: Billy Andersen MRN: [id number] DOB: [birth year] Today's Date: 06/09/2024  History of Present Illness  Shayne Diguglielmo is a 48 yo male admitted for severe acute back pain.  Workup with CT scan and MRI of the back did not show any fracture or severe stenosis.  Patient has new pulmonary hypertension and echocardiogram has been done and waiting for the result.  Patient to continue pain medications, muscle relaxant.  PMH: CAD, mild cognitive decline, bipolar, ADHD  Clinical Impression  Pt admitted with above diagnosis.  Pt currently with functional limitations due to the deficits listed below (see PT Problem List). Pt will benefit from acute skilled PT to increase their independence and safety with mobility to allow discharge.  Pt and spouse educated on back precautions and pillow positioning (in chair and bed) to assist with acute pain.  Pt able to ambulate into hallway however distance limited by onset of low back muscle spasms.  Pt returned to room in recliner.  Pt and spouse request PT return tomorrow to review education and assist with ambulation again as pt anticipates d/c soon.  Recommend OPPT and RW upon d/c          If plan is discharge home, recommend the following: Assistance with cooking/housework;Assist for transportation;Help with stairs or ramp for entrance   Can travel by private vehicle        Equipment Recommendations Rolling walker (2 wheels)  Recommendations for Other Services       Functional Status Assessment Patient has had a recent decline in their functional status and demonstrates the ability to make significant improvements in function in a reasonable and predictable amount of time.     Precautions / Restrictions Precautions Precautions: Fall;Back Precaution/Restrictions Comments: back for comfort      Mobility  Bed Mobility Overal bed mobility: Needs Assistance Bed Mobility: Rolling, Sidelying to  Sit Rolling: Supervision Sidelying to sit: Supervision       General bed mobility comments: cues for log rolling and body mechanics/spinal alignment    Transfers Overall transfer level: Needs assistance Equipment used: Rolling walker (2 wheels) Transfers: Sit to/from Stand Sit to Stand: Supervision, From elevated surface           General transfer comment: cues for hand placement and body mechanics for amb in room and short hallway distance with heavy reliance with UB on RW for offloading spinal pain and pressure    Ambulation/Gait Ambulation/Gait assistance: Contact guard assist Gait Distance (Feet): 45 Feet Assistive device: Rolling walker (2 wheels) Gait Pattern/deviations: Step-through pattern, Decreased stride length Gait velocity: decr     General Gait Details: verbal cues for RW positioning and use of upper body through RW for pain control; limited distance due to acute onset of low back muscle spasms and recliner provided due to increased pain (RN also notified)  Stairs            Wheelchair Mobility     Tilt Bed    Modified Rankin (Stroke Patients Only)       Balance                                             Pertinent Vitals/Pain Pain Assessment Pain Assessment: 0-10 Pain Score: 7  Pain Location: R lumbar spine region with radiation Pain Descriptors / Indicators: Sharp, Shooting, Throbbing, Spasm Pain Intervention(s): Repositioned,  Monitored during session, Premedicated before session, Ice applied (requested muscle relaxer from RN due to pt's reported spasm)    Home Living Family/patient expects to be discharged to:: Private residence Living Arrangements: Spouse/significant other Available Help at Discharge: Family Type of Home: House Home Access: Stairs to enter Entrance Stairs-Rails: None Secretary/administrator of Steps: 4   Home Layout: One level Home Equipment: None Additional Comments: wife and family bedside  for education and assist at home as needed    Prior Function Prior Level of Function : Independent/Modified Independent;Driving;Working/employed                     Extremity/Trunk Assessment   Upper Extremity Assessment Upper Extremity Assessment: Right hand dominant;Overall WFL for tasks assessed    Lower Extremity Assessment Lower Extremity Assessment: Overall WFL for tasks assessed (pt reports radicular symptoms down R LE)    Cervical / Trunk Assessment Cervical / Trunk Assessment: Normal  Communication   Communication Communication: No apparent difficulties    Cognition Arousal: Alert Behavior During Therapy: WFL for tasks assessed/performed   PT - Cognitive impairments: History of cognitive impairments                       PT - Cognition Comments: hx of cognitive decline Following commands: Intact       Cueing Cueing Techniques: Verbal cues     General Comments General comments (skin integrity, edema, etc.): pain management with pillow support, ice applied and intervals of amb rec with supervision and RW use    Exercises     Assessment/Plan    PT Assessment Patient needs continued PT services  PT Problem List Pain;Decreased activity tolerance;Decreased mobility;Decreased knowledge of use of DME;Decreased knowledge of precautions       PT Treatment Interventions Gait training;DME instruction;Balance training;Functional mobility training;Therapeutic activities;Therapeutic exercise;Patient/family education;Stair training    PT Goals (Current goals can be found in the Care Plan section)  Acute Rehab PT Goals PT Goal Formulation: With patient/family Time For Goal Achievement: 06/16/24 Potential to Achieve Goals: Good    Frequency Min 3X/week     Co-evaluation PT/OT/SLP Co-Evaluation/Treatment: Yes Reason for Co-Treatment: Other (comment) (poor pain tolerance) PT goals addressed during session: Mobility/safety with mobility;Proper use  of DME OT goals addressed during session: ADL's and self-care;Proper use of Adaptive equipment and DME       AM-PAC PT 6 Clicks Mobility  Outcome Measure Help needed turning from your back to your side while in a flat bed without using bedrails?: A Little Help needed moving from lying on your back to sitting on the side of a flat bed without using bedrails?: A Little Help needed moving to and from a bed to a chair (including a wheelchair)?: A Little Help needed standing up from a chair using your arms (e.g., wheelchair or bedside chair)?: A Little Help needed to walk in hospital room?: A Little Help needed climbing 3-5 steps with a railing? : A Little 6 Click Score: 18    End of Session Equipment Utilized During Treatment: Gait belt Activity Tolerance: Patient limited by pain Patient left: in chair;with call bell/phone within reach;with family/visitor present Nurse Communication: Mobility status PT Visit Diagnosis: Difficulty in walking, not elsewhere classified (R26.2)    Time: 1451-1520 PT Time Calculation (min) (ACUTE ONLY): 29 min   Charges:   PT Evaluation $PT Eval Low Complexity: 1 Low   PT General Charges $$ ACUTE PT VISIT: 1 Visit  Tari KLEIN, DPT Physical Therapist Acute Rehabilitation Services Office: [phone]   Tari LITTIE Farm 06/09/2024, 4:57 PM

## 2024-06-09 NOTE — Progress Notes (Deleted)
Discharge package printed and instructions given to pt. Pt verbalizes understanding. 

## 2024-06-09 NOTE — Evaluation (Signed)
 Occupational Therapy Evaluation Patient Details Name: Billy Andersen MRN: [id number] DOB: [birth year] Today's Date: 06/09/2024   History of Present Illness   Billy Andersen is a 48 yo male with PMH: CAD, mild cognitive decline, bipolar, ADHD admitted for severe acute back pain.  Workup with CT scan and MRI of the back did not show any fracture or severe stenosis.  Patient has new pulmonary hypertension and echocardiogram has been done and waiting for the result.  Patient to continue pain medications, muscle relaxant     Clinical Impressions Patient evaluated by Occupational Therapy with no further acute OT needs identified. All education has been completed and the patient has no further questions. Wife present bedside for education with patient open to all education related to pain management, body mechanics, positioning, spinal alignment and adapted strategies/methods for LB ADL and functional mobility on and off toilet.  Patient and wife with + teach back and understanding. OT recommending family support to ensure patient able to follow spinal protection for pain management. See below for any follow-up Occupational Therapy or equipment needs. OT is signing off. Thank you for this referral.     If plan is discharge home, recommend the following:   A little help with walking and/or transfers;A little help with bathing/dressing/bathroom;Assistance with cooking/housework;Assist for transportation;Help with stairs or ramp for entrance     Functional Status Assessment   Patient has had a recent decline in their functional status and demonstrates the ability to make significant improvements in function in a reasonable and predictable amount of time.     Equipment Recommendations   Other (comment) (rolling walker and family to obtain shower seat and reacher as needed)      Precautions/Restrictions   Precautions Precautions: Fall Restrictions Weight Bearing Restrictions Per  Provider Order: No     Mobility Bed Mobility Overal bed mobility: Needs Assistance Bed Mobility: Rolling, Supine to Sit Rolling: Supervision   Supine to sit: Supervision     General bed mobility comments: cues for log rolling and body mechanics/spinal alignment    Transfers Overall transfer level: Needs assistance Equipment used: Rolling walker (2 wheels) Transfers: Sit to/from Stand, Bed to chair/wheelchair/BSC Sit to Stand: Supervision, From elevated surface     Step pivot transfers: Supervision, From elevated surface     General transfer comment: cues for hand placement and body mechanics for amb in room and short hallway distance with heavy reliance with UB on RW for offloading spinal pain and pressure      Balance Overall balance assessment: No apparent balance deficits (not formally assessed)                                         ADL either performed or assessed with clinical judgement   ADL Overall ADL's : Needs assistance/impaired Eating/Feeding: Independent   Grooming: Independent;Sitting   Upper Body Bathing: Independent;Sitting   Lower Body Bathing: Minimal assistance;Sit to/from stand   Upper Body Dressing : Independent;Sitting   Lower Body Dressing: Minimal assistance;Sit to/from stand Lower Body Dressing Details (indicate cue type and reason): unable to figure four with OT rec assist from family Toilet Transfer: Contact guard assist Toilet Transfer Details (indicate cue type and reason): demonstrated use of RW over toilet frame and provided BSC over toilet with instruction Toileting- Clothing Manipulation and Hygiene: Modified independent     Tub/Shower Transfer Details (indicate cue type and reason): rec  use of chair instide stall shower if needed Functional mobility during ADLs: Contact guard assist;Rolling walker (2 wheels) General ADL Comments: educated on body mechanics and use of reacher to assist     Vision Baseline  Vision/History: 0 No visual deficits Ability to See in Adequate Light: 0 Adequate Vision Assessment?: No apparent visual deficits            Pertinent Vitals/Pain Pain Assessment Pain Assessment: 0-10 Pain Score: 7  Pain Location: R lumbar spine region with radiation Pain Descriptors / Indicators: Sharp, Shooting, Throbbing, Spasm Pain Intervention(s): Limited activity within patient's tolerance, Monitored during session, Premedicated before session, Repositioned, Relaxation, Ice applied     Extremity/Trunk Assessment Upper Extremity Assessment Upper Extremity Assessment: Right hand dominant;Overall [facility] for tasks assessed   Lower Extremity Assessment Lower Extremity Assessment: Defer to PT evaluation   Cervical / Trunk Assessment Cervical / Trunk Assessment: Normal   Communication Communication Communication: No apparent difficulties   Cognition Arousal: Alert Behavior During Therapy: WFL for tasks assessed/performed Cognition: No apparent impairments                               Following commands: Intact       Cueing  General Comments   Cueing Techniques: Verbal cues  pain management with pillow support, ice applied and intervals of amb rec with supervision and RW use           Home Living Family/patient expects to be discharged to:: Private residence Living Arrangements: Spouse/significant other Available Help at Discharge: Family Type of Home: House Home Access: Stairs to enter Secretary/administrator of Steps: 4 Entrance Stairs-Rails: None Home Layout: One level     Bathroom Shower/Tub: Walk-in shower;Door   [employer] Toilet: Handicapped height Bathroom Accessibility: Yes How Accessible: Accessible via walker Home Equipment: None   Additional Comments: wife and family bedside for education and assist at home as needed      Prior Functioning/Environment Prior Level of Function : Independent/Modified  Independent;Driving;Working/employed                    OT Problem List: Decreased activity tolerance;Pain             Co-evaluation PT/OT/SLP Co-Evaluation/Treatment: Yes Reason for Co-Treatment: Other (comment) (patient's pain tolerance poor) PT goals addressed during session: Mobility/safety with mobility;Proper use of DME OT goals addressed during session: ADL's and self-care;Proper use of Adaptive equipment and DME      AM-PAC OT 6 Clicks Daily Activity     Outcome Measure Help from another person eating meals?: None Help from another person taking care of personal grooming?: None Help from another person toileting, which includes using toliet, bedpan, or urinal?: A Little Help from another person bathing (including washing, rinsing, drying)?: None Help from another person to put on and taking off regular upper body clothing?: None Help from another person to put on and taking off regular lower body clothing?: A Little 6 Click Score: 22   End of Session Equipment Utilized During Treatment: Gait belt;Rolling walker (2 wheels) Nurse Communication: Mobility status  Activity Tolerance: Patient limited by pain Patient left: in chair;with call bell/phone within reach;with family/visitor present  OT Visit Diagnosis: Pain                Time: 1450-1515 OT Time Calculation (min): 25 min Charges:  OT General Charges $OT Visit: 1 Visit OT Evaluation $OT Eval Low Complexity: 1 Low  Gladies Sofranko OT/L Acute Rehabilitation Department  [phone]  06/09/2024, 3:45 PM

## 2024-06-09 NOTE — Plan of Care (Signed)

## 2024-06-09 NOTE — Plan of Care (Signed)
  Problem: Pain Managment: Goal: General experience of comfort will improve and/or be controlled Outcome: Progressing   Problem: Safety: Goal: Ability to remain free from injury will improve Outcome: Progressing   Problem: Activity: Goal: Risk for activity intolerance will decrease Outcome: Progressing

## 2024-06-09 NOTE — Progress Notes (Signed)
 OT Cancellation Note  Patient Details Name: Billy Andersen MRN: [id number] DOB: Oct 20, 1976   Cancelled Treatment:    Reason Eval/Treat Not Completed: Medical issues which prohibited therapy OT attempted initial eval and patient requesting to come back due to pain control. Will re-attempt visit later day.   Gwyneth Fernandez OT/L Acute Rehabilitation Department  [phone]    06/09/2024, 9:19 AM

## 2024-06-09 NOTE — Progress Notes (Signed)
  Progress Note   Patient: Billy Andersen FMW:[id number] DOB: Jun 13, 1976 DOA: 06/07/2024     0 DOS: the patient was seen and examined on 06/09/2024   Brief hospital course: 48 year old male/PMH of CAD, mild cognitive decline, bipolar, ADHD admitted for severe acute back pain.  Workup with CT scan and MRI of the back did not show any fracture or severe stenosis.  Patient has new pulmonary hypertension and echocardiogram has been done and waiting for the result.  Patient to continue pain medications, muscle relaxant and PT OT. Due to persistent pain I did discuss with Dr. Colon from neurosurgery who will come and see the patient.  Assessment and Plan: Acute back pain Supportive measures, suspect possible radiculopathy given burning sensation Possible disc herniation. CT and MRI lumbar spine with no acute finding, Continue pain management, PT/OT Patient has a persistent and intractable back pain on IV pain medications.  Reviewed images with the patient and his wife Delon.  Patient has a persistent pain.  I have discussed with neurosurgeon on-call Dr Colon who will come and see the patient today and give his recommendations.   Ascending aorta dilation (HCC) Chronic stable follow-up as an outpatient   CAD (coronary artery disease) Continue aspirin  81 mg daily continue Plavix  75 mg daily continue Lopressor  crease down to 12.5 mg twice daily given bradycardia continue Crestor  40 mg daily   Pure hypercholesterolemia Continue Crestor  40 mg daily   ADHD Hold Concerta  for today   Bipolar disorder (HCC) Chronic stable continue Lamictal  and Caplyta    Pulmonary hypertension Echocardiogram        Subjective: Persistent intractable back pain  Physical Exam: Vitals:   06/08/24 1746 06/08/24 2136 06/09/24 0652 06/09/24 1353  BP: (!) 101/53 114/60 102/67 115/73  Pulse: 69 66 (!) 54 72  Resp: 16 18 15 17   Temp: 98.2 F (36.8 C) 97.9 F (36.6 C) 97.8 F (36.6 C) 98 F (36.7 C)   TempSrc:   Oral   SpO2: 100% 100% 100% 100%  Weight:      Height:       Constitutional: Alert, awake, calm, comfortable HEENT: Neck supple Respiratory: clear to auscultation bilaterally, no wheezing, no crackles. Normal respiratory effort. No accessory muscle use.  Cardiovascular: Regular rate and rhythm, no murmurs / rubs / gallops. No extremity edema. 2+ pedal pulses. No carotid bruits.  Abdomen: no tenderness, no masses palpated. No hepatosplenomegaly. Bowel sounds positive.  Musculoskeletal: no clubbing / cyanosis. No joint deformity upper and lower extremities. Good ROM, no contractures. Normal muscle tone.  SLR T+ on right lower extremity up to 30 degree Skin: no rashes, lesions, ulcers. No induration Neurologic: CN 2-12 grossly intact. Sensation intact, DTR normal. Strength 5/5 x all 4 extremities.  Psychiatric: Normal judgment and insight. Alert and oriented x 3. Normal mood.   Data Reviewed:  MRI result with Dr. Colon, white count 8.8, potassium 3.5 BUN 13, creatinine 0.83  Family Communication: Discussed with patient and wife together on the phone with the wife  Disposition: Status is: Inpatient Remains inpatient appropriate because: Ongoing back pain  Planned Discharge Destination: Rehab versus home    Time spent: 40 minutes  Author: Nena Rebel, MD 06/09/2024 1:54 PM  For on call review [email or web address].

## 2024-06-09 NOTE — Plan of Care (Signed)
  Problem: Activity: Goal: Risk for activity intolerance will decrease Outcome: Progressing   Problem: Elimination: Goal: Will not experience complications related to urinary retention Outcome: Progressing   Problem: Pain Managment: Goal: General experience of comfort will improve and/or be controlled Outcome: Progressing   Problem: Safety: Goal: Ability to remain free from injury will improve Outcome: Progressing

## 2024-06-10 DIAGNOSIS — M5440 Lumbago with sciatica, unspecified side: Secondary | ICD-10-CM | POA: Diagnosis not present

## 2024-06-10 MED ORDER — CYCLOBENZAPRINE HCL 10 MG PO TABS: 10.0000 mg | ORAL_TABLET | Freq: Two times a day (BID) | ORAL | 0 refills | Status: AC | PRN

## 2024-06-10 MED ORDER — ACETAMINOPHEN 325 MG PO TABS
650.0000 mg | ORAL_TABLET | Freq: Four times a day (QID) | ORAL | 0 refills | Status: AC | PRN
Start: 2024-06-10 — End: ?

## 2024-06-10 NOTE — Progress Notes (Signed)
 Physical Therapy Treatment Patient Details Name: Billy Andersen MRN: [id number] DOB: Sep 13, 1976 Today's Date: 06/10/2024   History of Present Illness Billy Andersen is a 48 yo male admitted for severe acute back pain.  Workup with CT scan and MRI of the back did not show any fracture or severe stenosis.  Patient has new pulmonary hypertension and echocardiogram has been done and waiting for the result.  Patient to continue pain medications, muscle relaxant.  PMH: CAD, mild cognitive decline, bipolar, ADHD    PT Comments  Pt laying in window seat area on arrival to room.  Pt reports pain improved today and he has been up ad lib with RW in room this morning.  He declined need to mobilize at this time since he will be discharging home today.  Spouse and pt had questions which were answered within scope of practice.  Also reviewed back precautions and positioning for pain control/comfort.  Discussed treatment options and modalities at [facility] as pt anticipates f/u with OPPT (including McKenzie method which was mentioned by Dr. Colon).  Pt appears ready for d/c home today.    If plan is discharge home, recommend the following: Assistance with cooking/housework;Assist for transportation;Help with stairs or ramp for entrance   Can travel by private vehicle        Equipment Recommendations  Rolling walker (2 wheels)    Recommendations for Other Services       Precautions / Restrictions Precautions Precautions: Fall;Back Precaution/Restrictions Comments: back for comfort     Mobility  Bed Mobility Overal bed mobility: Needs Assistance Bed Mobility: Rolling, Sidelying to Sit Rolling: Supervision Sidelying to sit: Supervision       General bed mobility comments: cues for log rolling and body mechanics/spinal alignment    Transfers                        Ambulation/Gait                   Stairs             Wheelchair Mobility     Tilt Bed     Modified Rankin (Stroke Patients Only)       Balance                                            Communication Communication Communication: No apparent difficulties  Cognition Arousal: Alert Behavior During Therapy: WFL for tasks assessed/performed   PT - Cognitive impairments: History of cognitive impairments                       PT - Cognition Comments: hx of cognitive decline Following commands: Intact      Cueing Cueing Techniques: Verbal cues  Exercises      General Comments        Pertinent Vitals/Pain Pain Assessment Pain Assessment: 0-10 Pain Score: 3  Pain Location: R lumbar spine region with radiation Pain Descriptors / Indicators: Sharp, Shooting, Throbbing, Spasm Pain Intervention(s): Premedicated before session, Monitored during session, Repositioned    Home Living                          Prior Function            PT Goals (current goals can now be found in  the care plan section) Progress towards PT goals: Progressing toward goals    Frequency    Min 3X/week      PT Plan      Co-evaluation              AM-PAC PT 6 Clicks Mobility   Outcome Measure  Help needed turning from your back to your side while in a flat bed without using bedrails?: A Little Help needed moving from lying on your back to sitting on the side of a flat bed without using bedrails?: A Little Help needed moving to and from a bed to a chair (including a wheelchair)?: A Little Help needed standing up from a chair using your arms (e.g., wheelchair or bedside chair)?: A Little Help needed to walk in hospital room?: A Little Help needed climbing 3-5 steps with a railing? : A Little 6 Click Score: 18    End of Session   Activity Tolerance: Patient tolerated treatment well Patient left: with family/visitor present;with nursing/sitter in room (window seat with RN and spouse to review d/c instructions)   PT Visit  Diagnosis: Difficulty in walking, not elsewhere classified (R26.2)     Time: 8868-8852 PT Time Calculation (min) (ACUTE ONLY): 16 min  Charges:    $Therapeutic Activity: 8-22 mins PT General Charges $$ ACUTE PT VISIT: 1 Visit                    Tari KLEIN, DPT Physical Therapist Acute Rehabilitation Services Office: [phone]    Tari CROME [place] 06/10/2024, 12:45 PM

## 2024-06-10 NOTE — Discharge Summary (Signed)
 Physician Discharge Summary   Patient: Billy Andersen MRN: [id number] DOB: 04-17-1976  Admit date:     06/07/2024  Discharge date: 06/10/24  Discharge Physician: Nena Rebel   PCP: [name], MD   Recommendations at discharge:   Continue taking his pain medication Tylenol  and Flexeril  up to 5 days. Patient is on 20 mg of steroid I have explained to him that he cannot take more than 5 days of that even he could taper it. He needs to follow-up with primary care provider and his specialist for his back pain as outpatient.  Discharge Diagnoses: Principal Problem:   Acute back pain Active Problems:   CAD (coronary artery disease)   Pure hypercholesterolemia   Ascending aorta dilation (HCC)   ADHD   Bipolar disorder (HCC)   Back pain  Resolved Problems:   * No resolved hospital problems. *  Hospital Course: 48 year old male/PMH of CAD, mild cognitive decline, bipolar, ADHD admitted for severe acute back pain.  Workup with CT scan and MRI of the back did not show any fracture or severe stenosis.  Patient has new pulmonary hypertension and echocardiogram has been done and waiting for the result.  Patient to continue pain medications, muscle relaxant and PT OT. Due to persistent pain I did discuss with Dr. Colon from neurosurgery who came and evaluated the patient and offered him possible steroid injection.  But patient felt better today and expressed wishes not to do anything else at this point and go home.  Today his pain is rated around 3-4/10 and was able to walk around okay no problems.  He will be discharged home on his home medication.  He will be given Tylenol  and Flexeril  prescription.  He has prescription for steroid at home.  He should not take more than 5 days of steroid.  That was explained to the patient and he understood.  Assessment and Plan: Acute back pain Supportive measures, suspect possible radiculopathy given burning sensation Possible disc  herniation. CT and MRI lumbar spine with no acute finding, It appears that his problem is muscle strain.  Discussed with neurosurgeon Dr. Colon who came and evaluated the patient.  He advised epidural injection if needed.  However patient felt better and decided not to pursue anything at this point.  He was able to walk and he wanted to go home and follow-up with his provider as outpatient.  Ascending aorta dilation (HCC) Chronic stable follow-up as an outpatient   CAD (coronary artery disease) Continue aspirin  81 mg daily continue Plavix  75 mg daily continue Lopressor  crease down to 12.5 mg twice daily given bradycardia continue Crestor  40 mg daily   Pure hypercholesterolemia Continue Crestor  40 mg daily   ADHD Hold Concerta  for today   Bipolar disorder (HCC) Chronic stable continue Lamictal  and Caplyta    Pulmonary hypertension on CT scan Echo showed no pulmonary hypertension.       Consultants: Neurosurgery Dr. Colon Procedures performed: None Disposition: Home Diet recommendation:  Regular diet DISCHARGE MEDICATION: Allergies as of 06/10/2024   No Known Allergies      Medication List     STOP taking these medications    HYDROcodone -acetaminophen  5-325 MG tablet Commonly known as: NORCO/VICODIN       TAKE these medications    acetaminophen  325 MG tablet Commonly known as: TYLENOL  Take 2 tablets (650 mg total) by mouth every 6 (six) hours as needed for mild pain (pain score 1-3) (or Fever >/= 101).   Airborne Elderberry 100-50 MG  Chew Chew 2 tablets by mouth daily.   Pumpkin Seed Oil Caps Take 3 capsules by mouth in the morning.   ASHWAGANDHA GUMMIES PO Take 2 tablets by mouth See admin instructions. Chew 2 gummies by mouth in the morning   Aspirin  Low Dose 81 MG chewable tablet Generic drug: aspirin  chew ONE TABLET BY MOUTH ONCE DAILY What changed: See the new instructions.   atomoxetine  80 MG capsule Commonly known as: STRATTERA  Take 80 mg by  mouth in the morning.   Caplyta  42 MG capsule Generic drug: lumateperone  tosylate Take 42 mg by mouth at bedtime.   clopidogrel  75 MG tablet Commonly known as: PLAVIX  TAKE ONE TABLET BY MOUTH EVERY DAY WITH BREAKFAST What changed: See the new instructions.   cyclobenzaprine  10 MG tablet Commonly known as: FLEXERIL  Take 1 tablet (10 mg total) by mouth 2 (two) times daily as needed for muscle spasms.   lamoTRIgine  200 MG tablet Commonly known as: LAMICTAL  Take 200 mg by mouth See admin instructions. Take 200 mg by mouth in the morning and at 4 PM   metoprolol  tartrate 25 MG tablet Commonly known as: LOPRESSOR  Take 1 tablet (25 mg total) by mouth 2 (two) times daily. What changed:  when to take this additional instructions   nitroGLYCERIN  0.4 MG SL tablet Commonly known as: NITROSTAT  Place 1 tablet (0.4 mg total) under the tongue every 5 (five) minutes x 3 doses as needed for chest pain.   Omega-3 Gummies Chew Chew 2 tablets by mouth daily. VitaFusion   predniSONE  10 MG tablet Commonly known as: DELTASONE  Take 2 tablets (20 mg total) by mouth daily for 5 days.   Rezdiffra  80 MG Tabs Generic drug: Resmetirom  Take 1 tablet by mouth daily. Sent to [facility] fax [phone]; phone [phone]   rosuvastatin  40 MG tablet Commonly known as: CRESTOR  TAKE ONE TABLET BY MOUTH EVERY DAY        Discharge Exam: Filed Weights   06/08/24 0140  Weight: 96.9 kg   Constitutional: Alert, awake, calm, comfortable HEENT: Neck supple Respiratory: clear to auscultation bilaterally, no wheezing, no crackles. Normal respiratory effort. No accessory muscle use.  Cardiovascular: Regular rate and rhythm, no murmurs / rubs / gallops. No extremity edema. 2+ pedal pulses. No carotid bruits.  Abdomen: no tenderness, no masses palpated. No hepatosplenomegaly. Bowel sounds positive.  Musculoskeletal: no clubbing / cyanosis. No joint deformity upper and lower extremities. Good  ROM, no contractures. Normal muscle tone.  Skin: no rashes, lesions, ulcers. No induration Neurologic: CN 2-12 grossly intact. Sensation intact, DTR normal. Strength 5/5 x all 4 extremities.  Psychiatric: Normal judgment and insight. Alert and oriented x 3. Normal mood.    Condition at discharge: stable  The results of significant diagnostics from this hospitalization (including imaging, microbiology, ancillary and laboratory) are listed below for reference.   Imaging Studies: ECHOCARDIOGRAM COMPLETE Result Date: 06/08/2024    ECHOCARDIOGRAM REPORT   Patient Name:   Billy Andersen Date of Exam: 06/08/2024 Medical Rec #:  [id number]             Height:       74.0 in Accession #:    [id number]            Weight:       213.6 lb Date of Birth:  Apr 03, 1976             BSA:          2.235 m Patient Age:    6  years              BP:           110/64 mmHg Patient Gender: M                     HR:           67 bpm. Exam Location:  Inpatient Procedure: 2D Echo, Cardiac Doppler and Color Doppler (Both Spectral and Color            Flow Doppler were utilized during procedure). Indications:    Pulmonary hypertension  History:        Patient has prior history of Echocardiogram examinations, most                 recent 02/04/2024. CAD.  Sonographer:    Benard Stallion Referring Phys: [id number] Kashawn Manzano IMPRESSIONS  1. Left ventricular ejection fraction, by estimation, is 60 to 65%. The left ventricle has normal function. The left ventricle has no regional wall motion abnormalities. Left ventricular diastolic parameters were normal.  2. Right ventricular systolic function is normal. The right ventricular size is normal. Tricuspid regurgitation signal is inadequate for assessing PA pressure.  3. The mitral valve is normal in structure. Trivial mitral valve regurgitation. No evidence of mitral stenosis.  4. The aortic valve is normal in structure. Aortic valve regurgitation is trivial. No aortic stenosis is present.   5. Aortic dilatation noted. There is mild dilatation of the ascending aorta, measuring 44 mm. There is mild dilatation of the aortic root, measuring 39 mm.  6. The inferior vena cava is normal in size with greater than 50% respiratory variability, suggesting right atrial pressure of 3 mmHg. FINDINGS  Left Ventricle: Left ventricular ejection fraction, by estimation, is 60 to 65%. The left ventricle has normal function. The left ventricle has no regional wall motion abnormalities. The left ventricular internal cavity size was normal in size. There is  no left ventricular hypertrophy. Left ventricular diastolic parameters were normal. Right Ventricle: The right ventricular size is normal. Right ventricular systolic function is normal. Tricuspid regurgitation signal is inadequate for assessing PA pressure. The tricuspid regurgitant velocity is 1.75 m/s, and with an assumed right atrial  pressure of 3 mmHg, the estimated right ventricular systolic pressure is 15.2 mmHg. Left Atrium: Left atrial size was normal in size. Right Atrium: Right atrial size was normal in size. Pericardium: There is no evidence of pericardial effusion. Mitral Valve: The mitral valve is normal in structure. Trivial mitral valve regurgitation. No evidence of mitral valve stenosis. Tricuspid Valve: The tricuspid valve is normal in structure. Tricuspid valve regurgitation is trivial. No evidence of tricuspid stenosis. Aortic Valve: The aortic valve is normal in structure. Aortic valve regurgitation is trivial. No aortic stenosis is present. Aortic valve mean gradient measures 6.0 mmHg. Aortic valve peak gradient measures 11.6 mmHg. Aortic valve area, by VTI measures 3.75 cm. Pulmonic Valve: The pulmonic valve was normal in structure. Pulmonic valve regurgitation is trivial. No evidence of pulmonic stenosis. Aorta: Aortic dilatation noted. There is mild dilatation of the ascending aorta, measuring 44 mm. There is mild dilatation of the aortic  root, measuring 39 mm. Venous: The inferior vena cava is normal in size with greater than 50% respiratory variability, suggesting right atrial pressure of 3 mmHg. IAS/Shunts: No atrial level shunt detected by color flow Doppler.  LEFT VENTRICLE PLAX 2D LVIDd:         5.10 cm  Diastology LVIDs:         3.30 cm      LV e' medial:    9.57 cm/s LV PW:         1.10 cm      LV E/e' medial:  11.3 LV IVS:        1.10 cm      LV e' lateral:   9.68 cm/s LVOT diam:     2.60 cm      LV E/e' lateral: 11.2 LV SV:         131 LV SV Index:   58 LVOT Area:     5.31 cm  LV Volumes (MOD) LV vol d, MOD A2C: 141.0 ml LV vol d, MOD A4C: 118.0 ml LV vol s, MOD A2C: 46.6 ml LV vol s, MOD A4C: 44.9 ml LV SV MOD A2C:     94.4 ml LV SV MOD A4C:     118.0 ml LV SV MOD BP:      89.4 ml RIGHT VENTRICLE RV Basal diam:  3.10 cm RV Mid diam:    3.30 cm RV S prime:     15.00 cm/s TAPSE (M-mode): 3.3 cm LEFT ATRIUM             Index        RIGHT ATRIUM           Index LA diam:        3.70 cm 1.66 cm/m   RA Area:     13.60 cm LA Vol (A2C):   48.8 ml 21.83 ml/m  RA Volume:   33.40 ml  14.94 ml/m LA Vol (A4C):   28.2 ml 12.62 ml/m LA Biplane Vol: 40.8 ml 18.25 ml/m  AORTIC VALVE AV Area (Vmax):    3.62 cm AV Area (Vmean):   3.58 cm AV Area (VTI):     3.75 cm AV Vmax:           170.00 cm/s AV Vmean:          116.000 cm/s AV VTI:            0.348 m AV Peak Grad:      11.6 mmHg AV Mean Grad:      6.0 mmHg LVOT Vmax:         116.00 cm/s LVOT Vmean:        78.300 cm/s LVOT VTI:          0.246 m LVOT/AV VTI ratio: 0.71  AORTA Ao Root diam: 3.90 cm Ao Asc diam:  4.35 cm MITRAL VALVE                TRICUSPID VALVE MV Area (PHT): 3.77 cm     TR Peak grad:   12.2 mmHg MV Decel Time: 201 msec     TR Vmax:        175.00 cm/s MR Peak grad: 91.8 mmHg MR Vmax:      479.00 cm/s   SHUNTS MV E velocity: 108.00 cm/s  Systemic VTI:  0.25 m MV A velocity: 69.40 cm/s   Systemic Diam: 2.60 cm MV E/A ratio:  1.56 Redell Shallow MD Electronically signed by Redell Shallow MD Signature Date/Time: 06/08/2024/4:35:58 PM    Final    MR Lumbar Spine W Wo Contrast Result Date: 06/08/2024 CLINICAL DATA:  Provided history: Severe back pain radiating to left leg. EXAM: MRI LUMBAR SPINE WITHOUT AND WITH CONTRAST TECHNIQUE: Multiplanar and multiecho pulse sequences of the lumbar spine were obtained  without and with intravenous contrast. CONTRAST:  10mL GADAVIST  GADOBUTROL  1 MMOL/ML IV SOLN COMPARISON:  Lumbar spine CT 06/07/2024. FINDINGS: Segmentation: 5 lumbar vertebrae. The caudal most well-formed intervertebral disc space is designated L5-S1 Alignment: 2 mm L4-L5 grade 1 anterolisthesis. Vertebrae: No lumbar vertebral compression fracture. Marrow edema and enhancement within the bilateral L5 pars interarticulari/pedicles. Conus medullaris and cauda equina: Conus extends to the L1 level. No signal abnormality identified within the visualized distal spinal cord. Paraspinal and other soft tissues: No acute finding within included portions of the abdomen/retroperitoneum. No paraspinal mass or collection. Disc levels: Mild disc degeneration at L5-S1. Intervertebral disc height and hydration are largely preserved at the remaining lumbar levels. T12-L1: This level is imaged in the sagittal plane only. No significant disc herniation or stenosis. L1-L2: No significant disc herniation or stenosis. L2-L3: Facet hypertrophy. No significant disc herniation or stenosis. L3-L4: Facet hypertrophy. No significant disc herniation or stenosis. L4-L5: 2 mm grade 1 anterolisthesis. Moderate facet arthropathy with ligamentum flavum hypertrophy. Small bilateral facet joint effusions. Small bilateral posteriorly projecting synovial facet cysts. Moderate right subarticular stenosis with potential to affect the descending right L5 nerve root. Mild left subarticular stenosis. No significant central canal stenosis. Mild left neural foraminal narrowing. L5-S1: Small posterior annular fissure. Small disc bulge.  No significant spinal canal stenosis. Mild left neural foraminal narrowing. IMPRESSION: 1. Lumbar spondylosis as outlined within the body of the report. 2. At L4-L5, there is 2 mm grade 1 anterolisthesis. Moderate facet arthropathy with ligamentum flavum hypertrophy. Small bilateral facet joint effusions. Small bilateral posteriorly projecting synovial facet cyst. Moderate right subarticular stenosis with potential to affect the descending right L5 nerve root. Mild left subarticular stenosis. Mild left neural foraminal narrowing. 3. At L5-S1, there is mild disc degeneration. Disc bulge. No significant spinal canal stenosis. Mild left neural foraminal narrowing. 4. No significant disc herniation, spinal canal stenosis or neural foraminal narrowing at the remaining lumbar levels. 5. Marrow edema and enhancement within the bilateral L5 pars interarticulari/pedicles. This may be degenerative and related to facet arthropathy, or may reflect stress reaction. Electronically Signed   By: Rockey Childs D.O.   On: 06/08/2024 09:49   CT Lumbar Spine Wo Contrast Result Date: 06/07/2024 CLINICAL DATA:  Insert stray EXAM: CT LUMBAR SPINE WITHOUT CONTRAST TECHNIQUE: Multidetector CT imaging of the lumbar spine was performed without intravenous contrast administration. Multiplanar CT image reconstructions were also generated. RADIATION DOSE REDUCTION: This exam was performed according to the departmental dose-optimization program which includes automated exposure control, adjustment of the mA and/or kV according to patient size and/or use of iterative reconstruction technique. COMPARISON:  None Available. FINDINGS: Segmentation: 5 lumbar type vertebrae. Alignment: Normal. Vertebrae: Mild osteophyte formation and moderate facet arthropathy. No associated severe osseous neural foraminal or central canal stenosis. No acute fracture or focal pathologic process. Paraspinal and other soft tissues: Negative. Disc levels: Contained.  IMPRESSION: No acute displaced fracture or traumatic listhesis of the lumbar spine. Electronically Signed   By: Morgane  Naveau M.D.   On: 06/07/2024 21:52   CT Angio Chest/Abd/Pel for Dissection W and/or W/WO Result Date: 06/07/2024 CLINICAL DATA:  Acute aortic syndrome (AAS) suspected EXAM: CT ANGIOGRAPHY CHEST, ABDOMEN AND PELVIS TECHNIQUE: Non-contrast CT of the chest was initially obtained. Multidetector CT imaging through the chest, abdomen and pelvis was performed using the standard protocol during bolus administration of intravenous contrast. Multiplanar reconstructed images and MIPs were obtained and reviewed to evaluate the vascular anatomy. RADIATION DOSE REDUCTION: This exam was performed according  to the departmental dose-optimization program which includes automated exposure control, adjustment of the mA and/or kV according to patient size and/or use of iterative reconstruction technique. CONTRAST:  100mL OMNIPAQUE  IOHEXOL  350 MG/ML SOLN COMPARISON:  None Available. FINDINGS: CTA CHEST FINDINGS Cardiovascular: Satisfactory opacification of the pulmonary arteries to the segmental level as well as the thoracic aorta. No evidence of pulmonary embolism. Normal heart size. No significant pericardial effusion. Aneurysmal ascending thoracic aorta measuring up to 4.3 cm. No aortic dissection. No atherosclerotic plaque of the thoracic aorta. No coronary artery calcifications. Aortic valve leaflet calcifications. The main pulmonary artery is enlarged in caliber measuring up to 3.3 cm. No central or segmental pulmonary embolus. Limited evaluation more distally due to timing of contrast. Mediastinum/Nodes: No enlarged mediastinal, hilar, or axillary lymph nodes. Thyroid  gland, trachea, and esophagus demonstrate no significant findings. Lungs/Pleura: Azygous fissure noted. No focal consolidation. No pulmonary nodule. No pulmonary mass. No pleural effusion. No pneumothorax. Musculoskeletal: No chest wall  abnormality. No suspicious lytic or blastic osseous lesions. No acute displaced fracture. Review of the MIP images confirms the above findings. CTA ABDOMEN AND PELVIS FINDINGS VASCULAR Aorta: Normal caliber aorta without aneurysm, dissection, vasculitis or significant stenosis. Celiac: Patent without evidence of aneurysm, dissection, vasculitis or significant stenosis. SMA: Patent without evidence of aneurysm, dissection, vasculitis or significant stenosis. Renals: Both renal arteries are patent without evidence of aneurysm, dissection, vasculitis, fibromuscular dysplasia or significant stenosis. IMA: Patent without evidence of aneurysm, dissection, vasculitis or significant stenosis. Inflow: Patent without evidence of aneurysm, dissection, vasculitis or significant stenosis. Veins: No obvious venous abnormality within the limitations of this arterial phase study. Review of the MIP images confirms the above findings. NON-VASCULAR Hepatobiliary: No focal liver abnormality. No gallstones, gallbladder wall thickening, or pericholecystic fluid. No biliary dilatation. Pancreas: No focal lesion. Normal pancreatic contour. No surrounding inflammatory changes. No main pancreatic ductal dilatation. Spleen: Normal in size without focal abnormality. Adrenals/Urinary Tract: No adrenal nodule bilaterally. Bilateral kidneys enhance symmetrically. No hydronephrosis. No hydroureter. The urinary bladder is unremarkable. Stomach/Bowel: Stomach is within normal limits. No evidence of bowel wall thickening or dilatation. Colonic diverticulosis. Appendix appears normal. Lymphatic: No lymphadenopathy. Reproductive: Prostate is unremarkable. Other: No intraperitoneal free fluid. No intraperitoneal free gas. No organized fluid collection. Musculoskeletal: No abdominal wall hernia or abnormality. No suspicious lytic or blastic osseous lesions. No acute displaced fracture. Please see separately dictated CT lumbar spine 06/07/2024. Review of  the MIP images confirms the above findings. IMPRESSION: 1. Aneurysmal ascending thoracic aorta (4.3 cm). Recommend annual imaging followup by CTA or MRA. This recommendation follows 2010 ACCF/AHA/AATS/ACR/ASA/SCA/SCAI/SIR/STS/SVM Guidelines for the Diagnosis and Management of Patients with Thoracic Aortic Disease. Circulation. 2010; 121: Z733-z630. Aortic aneurysm NOS (ICD10-I71.9). 2. Aortic valve leaflet calcifications-correlate for aortic stenosis. 3. No central or segmental pulmonary embolus. Limited evaluation more distally. 4. Enlarged main pulmonary artery-correlate for pulmonary hypertension. 5. Colonic diverticulosis with no acute diverticulitis. Electronically Signed   By: Morgane  Naveau M.D.   On: 06/07/2024 21:51    Microbiology: No results found for this or any previous visit.  Labs: CBC: Recent Labs  Lab 06/07/24 2037 06/08/24 0335  WBC 13.0* 8.8  NEUTROABS 10.4*  --   HGB 15.9 15.6  HCT 46.2 45.7  MCV 93.1 95.4  PLT 184 161   Basic Metabolic Panel: Recent Labs  Lab 06/07/24 2037 06/08/24 0335  NA 140 139  K 3.7 3.5  CL 101 102  CO2 29 27  GLUCOSE 117* 120*  BUN 13 13  CREATININE  0.87 0.83  CALCIUM  9.3 8.8*  MG  --  2.1  PHOS  --  4.1   Liver Function Tests: Recent Labs  Lab 06/07/24 2037 06/08/24 0335  AST 22 17  ALT 27 23  ALKPHOS 45 42  BILITOT 0.5 0.4  PROT 6.8 6.2*  ALBUMIN 4.2 3.8    Discharge time spent: greater than 30 minutes.  Signed: Nena Rebel, MD [facility] 06/10/2024

## 2024-06-10 NOTE — TOC Progression Note (Signed)
 Transition of Care [facility]) - Progression Note    Patient Details  Name: Billy Andersen MRN: [id number] Date of Birth: 1976/03/14  Transition of Care [facility] CM/SW Contact  Alfonse JONELLE Rex, RN Phone Number: 06/10/2024, 10:00 AM  Clinical Narrative:   PT eval completed, recommendation for OPPT, RW, pt agreeable. Ambulatory referral to [facility], RW provided at bedside by [employer]. No further TOC needs identified at this time.     Expected Discharge Plan: Home/Self Care Barriers to Discharge: Barriers Resolved  Expected Discharge Plan and Services       Living arrangements for the past 2 months: Single Family Home Expected Discharge Date: 06/10/24               DME Arranged: Vannie rolling DME Agency: [employer] Date DME Agency Contacted: 06/10/24 Time DME Agency Contacted: 1000 Representative spoke with at DME Agency: Kaitlyn             Social Determinants of Health (SDOH) Interventions SDOH Screenings   Food Insecurity: No Food Insecurity (06/08/2024)  Housing: Low Risk  (06/08/2024)  Transportation Needs: No Transportation Needs (06/08/2024)  Utilities: Not At Risk (06/08/2024)  Depression (PHQ2-9): Medium Risk (12/29/2023)  Financial Resource Strain: Low Risk  (12/29/2023)  Physical Activity: Insufficiently Active (12/29/2023)  Social Connections: Moderately Integrated (12/29/2023)  Stress: No Stress Concern Present (12/29/2023)  Tobacco Use: Low Risk  (06/08/2024)    Readmission Risk Interventions    06/09/2024    2:26 PM  Readmission Risk Prevention Plan  Post Dischage Appt Complete  Medication Screening Complete  Transportation Screening Complete

## 2024-06-10 NOTE — Plan of Care (Signed)
 Patient discharged home via private vehicle with wife. AVS and discharge instruction provided. Patient and wife verbalized understanding. Jon LULLA Reins, RN 06/10/24 2:00 PM

## 2024-06-10 NOTE — Plan of Care (Signed)
 Problem: Education: Goal: Knowledge of General Education information will improve Description: Including pain rating scale, medication(s)/side effects and non-pharmacologic comfort measures Outcome: Progressing   Problem: Clinical Measurements: Goal: Ability to maintain clinical measurements within normal limits will improve Outcome: Progressing   Problem: Activity: Goal: Risk for activity intolerance will decrease Outcome: Progressing   Problem: Pain Managment: Goal: General experience of comfort will improve and/or be controlled Outcome: Progressing   Jon LULLA Reins, RN 06/10/24 9:47 AM

## 2024-06-10 NOTE — Consult Note (Signed)
 Reason for Consult: Back pain Referring Physician: Jairon Ripberger is an 48 y.o. male.  HPI: Patient is a 48 year old white male who notes that he has first onset of severe and debilitating back pain will over a week ago he [place] and [place] pick up a shell when his back locked up suddenly and unexpectedly the pain was across the mid and lower lumbar spine and he could not move.  Gradually he was able to make it back to the house and the following day he was able to drive home however once he arrived home he notes that he had a second episode of severe debilitating back pain while working around the pool at his house.  He had to be dragged on the sheet to get into the house and ultimately he ended up in the emergency department unable to move or stand.  His motor strength is always been intact and he has no numbness in his legs save for some modest section of pain into the left buttock and left hip.  Radiographs demonstrate that the patient has essentially normal-looking spine and MRI was performed which demonstrates the most minimal of the early is degenerative changes in the facets at L4-L5.  The radiologist notes a 2 mm anterolisthesis of L4 and L5.  There is no evidence of any neural entrapment nerve encroachment.  Patient has been at bedrest.  He notes that he has not had a bowel movement today for yesterday where his bowels barely moved he has been receiving some opioid pain medication but he would prefer to stay away from this.  I discussed the findings of the MRI with the patient and I noted that his back is essentially pristine from an anatomic standpoint I have suggested conservative measures for him including nonsteroidal anti-inflammatory and the mild muscle relaxer such as Flexeril  or Robaxin.  Beyond this I believe that up program of McKenzie back exercises would be of benefit to him and I have given him information regarding to basic extensor postures  that can be started in a nonweightbearing fashion also advised physical therapy regarding postural exercises.  Over time I believe that his back pain will subside as anti-inflammatory changes that may be aggravating will settle nonetheless I would caution against any significant invasive interventions.  I do believe that 1 could try a translaminar epidural injection at the level of L4-L5 to see if this gives him significant relief though he did have a course of oral prednisone  which did not seem to have much effect.  This can be arranged either by the interventionalists at the hospital or as an outpatient.  From a surgical standpoint however I believe there is little to offer this individual.  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  Iron normal   Ruptured plantar fascia    left, 2022    Past Surgical History:  Procedure Laterality Date   CORONARY STENT INTERVENTION N/A 09/14/2023   Procedure: CORONARY STENT INTERVENTION;  Surgeon: Ladona Heinz, MD;  Location: [facility] INVASIVE CV LAB;  Service: Cardiovascular;  Laterality: N/A;   EXTERNAL EAR SURGERY Right    trauma (bit off)   LEFT HEART CATH AND CORONARY ANGIOGRAPHY N/A 09/14/2023   Procedure: LEFT HEART CATH AND CORONARY ANGIOGRAPHY;  Surgeon: Ladona Heinz, MD;  Location: [facility] INVASIVE CV LAB;  Service: Cardiovascular;  Laterality: N/A;   LEFT HEART CATH AND CORONARY ANGIOGRAPHY N/A 09/22/2023   Procedure: LEFT HEART CATH AND CORONARY ANGIOGRAPHY;  Surgeon: Wonda Sharper, MD;  Location: [facility] INVASIVE CV LAB;  Service: Cardiovascular;   Laterality: N/A;    Family History  Problem Relation Age of Onset   Rectal cancer Mother    Cancer Mother 8       Cholangiocarcinoma   Heart disease Father 50       CABG   Thyroid  disease Father    Heart disease Maternal Grandmother    Heart attack Maternal Grandfather    Crohn's disease Paternal Grandmother    Stroke Paternal Grandfather    Heart failure Paternal Grandfather    Diabetes Paternal Aunt    Diabetes Paternal Uncle    Lactose intolerance Daughter     Social History:  reports that he has never smoked. He has never used smokeless tobacco. He reports that he does not currently use alcohol. He reports that he does not use drugs.  Allergies: No Known Allergies  Medications: I have reviewed the patient's current medications.  No results found for this or any previous visit (from the past 48 hours).  ECHOCARDIOGRAM COMPLETE Result Date: 06/08/2024    ECHOCARDIOGRAM REPORT   Patient Name:   Billy Andersen Date of Exam: 06/08/2024 Medical Rec #:  [id number]             Height:       74.0 in Accession #:    [id number]            Weight:       213.6 lb Date of Birth:  04-Nov-1976             BSA:          2.235 m Patient Age:    48 years              BP:           110/64 mmHg Patient Gender: M                     HR:           67 bpm. Exam Location:  Inpatient Procedure: 2D Echo, Cardiac Doppler and Color Doppler (Both Spectral and Color            Flow Doppler were utilized during procedure). Indications:    Pulmonary hypertension  History:        Patient has prior history of Echocardiogram examinations, most                 recent 02/04/2024. CAD.  Sonographer:    Benard Stallion Referring Phys: [id number] KESHAB PAUDEL IMPRESSIONS  1. Left ventricular ejection fraction, by estimation, is 60 to 65%. The left ventricle has normal function. The left ventricle has no regional wall motion abnormalities. Left ventricular diastolic parameters were normal.  2. Right ventricular systolic  function is normal. The right ventricular size is normal. Tricuspid regurgitation signal is inadequate for assessing PA pressure.  3. The mitral valve is normal in structure. Trivial mitral valve regurgitation. No evidence of mitral stenosis.  4. The  aortic valve is normal in structure. Aortic valve regurgitation is trivial. No aortic stenosis is present.  5. Aortic dilatation noted. There is mild dilatation of the ascending aorta, measuring 44 mm. There is mild dilatation of the aortic root, measuring 39 mm.  6. The inferior vena cava is normal in size with greater than 50% respiratory variability, suggesting right atrial pressure of 3 mmHg. FINDINGS  Left Ventricle: Left ventricular ejection fraction, by estimation, is 60 to 65%. The left ventricle has normal function. The left ventricle has no regional wall motion abnormalities. The left ventricular internal cavity size was normal in size. There is  no left ventricular hypertrophy. Left ventricular diastolic parameters were normal. Right Ventricle: The right ventricular size is normal. Right ventricular systolic function is normal. Tricuspid regurgitation signal is inadequate for assessing PA pressure. The tricuspid regurgitant velocity is 1.75 m/s, and with an assumed right atrial  pressure of 3 mmHg, the estimated right ventricular systolic pressure is 15.2 mmHg. Left Atrium: Left atrial size was normal in size. Right Atrium: Right atrial size was normal in size. Pericardium: There is no evidence of pericardial effusion. Mitral Valve: The mitral valve is normal in structure. Trivial mitral valve regurgitation. No evidence of mitral valve stenosis. Tricuspid Valve: The tricuspid valve is normal in structure. Tricuspid valve regurgitation is trivial. No evidence of tricuspid stenosis. Aortic Valve: The aortic valve is normal in structure. Aortic valve regurgitation is trivial. No aortic stenosis is present. Aortic valve mean gradient measures 6.0 mmHg. Aortic  valve peak gradient measures 11.6 mmHg. Aortic valve area, by VTI measures 3.75 cm. Pulmonic Valve: The pulmonic valve was normal in structure. Pulmonic valve regurgitation is trivial. No evidence of pulmonic stenosis. Aorta: Aortic dilatation noted. There is mild dilatation of the ascending aorta, measuring 44 mm. There is mild dilatation of the aortic root, measuring 39 mm. Venous: The inferior vena cava is normal in size with greater than 50% respiratory variability, suggesting right atrial pressure of 3 mmHg. IAS/Shunts: No atrial level shunt detected by color flow Doppler.  LEFT VENTRICLE PLAX 2D LVIDd:         5.10 cm      Diastology LVIDs:         3.30 cm      LV e' medial:    9.57 cm/s LV PW:         1.10 cm      LV E/e' medial:  11.3 LV IVS:        1.10 cm      LV e' lateral:   9.68 cm/s LVOT diam:     2.60 cm      LV E/e' lateral: 11.2 LV SV:         131 LV SV Index:   58 LVOT Area:     5.31 cm  LV Volumes (MOD) LV vol d, MOD A2C: 141.0 ml LV vol d, MOD A4C: 118.0 ml LV vol s, MOD A2C: 46.6 ml LV vol s, MOD A4C: 44.9 ml LV SV MOD A2C:     94.4 ml LV SV MOD A4C:     118.0 ml LV SV MOD BP:      89.4 ml RIGHT VENTRICLE RV Basal diam:  3.10 cm RV Mid diam:    3.30 cm RV S prime:     15.00 cm/s TAPSE (M-mode): 3.3 cm LEFT ATRIUM             Index        RIGHT ATRIUM  Index LA diam:        3.70 cm 1.66 cm/m   RA Area:     13.60 cm LA Vol (A2C):   48.8 ml 21.83 ml/m  RA Volume:   33.40 ml  14.94 ml/m LA Vol (A4C):   28.2 ml 12.62 ml/m LA Biplane Vol: 40.8 ml 18.25 ml/m  AORTIC VALVE AV Area (Vmax):    3.62 cm AV Area (Vmean):   3.58 cm AV Area (VTI):     3.75 cm AV Vmax:           170.00 cm/s AV Vmean:          116.000 cm/s AV VTI:            0.348 m AV Peak Grad:      11.6 mmHg AV Mean Grad:      6.0 mmHg LVOT Vmax:         116.00 cm/s LVOT Vmean:        78.300 cm/s LVOT VTI:          0.246 m LVOT/AV VTI ratio: 0.71  AORTA Ao Root diam: 3.90 cm Ao Asc diam:  4.35 cm MITRAL VALVE                 TRICUSPID VALVE MV Area (PHT): 3.77 cm     TR Peak grad:   12.2 mmHg MV Decel Time: 201 msec     TR Vmax:        175.00 cm/s MR Peak grad: 91.8 mmHg MR Vmax:      479.00 cm/s   SHUNTS MV E velocity: 108.00 cm/s  Systemic VTI:  0.25 m MV A velocity: 69.40 cm/s   Systemic Diam: 2.60 cm MV E/A ratio:  1.56 Redell Shallow MD Electronically signed by Redell Shallow MD Signature Date/Time: 06/08/2024/4:35:58 PM    Final    MR Lumbar Spine W Wo Contrast Result Date: 06/08/2024 CLINICAL DATA:  Provided history: Severe back pain radiating to left leg. EXAM: MRI LUMBAR SPINE WITHOUT AND WITH CONTRAST TECHNIQUE: Multiplanar and multiecho pulse sequences of the lumbar spine were obtained without and with intravenous contrast. CONTRAST:  10mL GADAVIST  GADOBUTROL  1 MMOL/ML IV SOLN COMPARISON:  Lumbar spine CT 06/07/2024. FINDINGS: Segmentation: 5 lumbar vertebrae. The caudal most well-formed intervertebral disc space is designated L5-S1 Alignment: 2 mm L4-L5 grade 1 anterolisthesis. Vertebrae: No lumbar vertebral compression fracture. Marrow edema and enhancement within the bilateral L5 pars interarticulari/pedicles. Conus medullaris and cauda equina: Conus extends to the L1 level. No signal abnormality identified within the visualized distal spinal cord. Paraspinal and other soft tissues: No acute finding within included portions of the abdomen/retroperitoneum. No paraspinal mass or collection. Disc levels: Mild disc degeneration at L5-S1. Intervertebral disc height and hydration are largely preserved at the remaining lumbar levels. T12-L1: This level is imaged in the sagittal plane only. No significant disc herniation or stenosis. L1-L2: No significant disc herniation or stenosis. L2-L3: Facet hypertrophy. No significant disc herniation or stenosis. L3-L4: Facet hypertrophy. No significant disc herniation or stenosis. L4-L5: 2 mm grade 1 anterolisthesis. Moderate facet arthropathy with ligamentum flavum hypertrophy. Small  bilateral facet joint effusions. Small bilateral posteriorly projecting synovial facet cysts. Moderate right subarticular stenosis with potential to affect the descending right L5 nerve root. Mild left subarticular stenosis. No significant central canal stenosis. Mild left neural foraminal narrowing. L5-S1: Small posterior annular fissure. Small disc bulge. No significant spinal canal stenosis. Mild left neural foraminal narrowing. IMPRESSION: 1. Lumbar spondylosis as outlined within  the body of the report. 2. At L4-L5, there is 2 mm grade 1 anterolisthesis. Moderate facet arthropathy with ligamentum flavum hypertrophy. Small bilateral facet joint effusions. Small bilateral posteriorly projecting synovial facet cyst. Moderate right subarticular stenosis with potential to affect the descending right L5 nerve root. Mild left subarticular stenosis. Mild left neural foraminal narrowing. 3. At L5-S1, there is mild disc degeneration. Disc bulge. No significant spinal canal stenosis. Mild left neural foraminal narrowing. 4. No significant disc herniation, spinal canal stenosis or neural foraminal narrowing at the remaining lumbar levels. 5. Marrow edema and enhancement within the bilateral L5 pars interarticulari/pedicles. This may be degenerative and related to facet arthropathy, or may reflect stress reaction. Electronically Signed   By: Rockey Childs D.O.   On: 06/08/2024 09:49    Review of Systems  Musculoskeletal:  Positive for back pain and gait problem.  All other systems reviewed and are negative.  Blood pressure 102/61, pulse 65, temperature 98 F (36.7 C), temperature source Oral, resp. rate 16, height 6' 2.02 (1.88 m), weight 96.9 kg, SpO2 98%. Physical Exam Constitutional:      Appearance: Normal appearance.  HENT:     Head: Normocephalic and atraumatic.     Right Ear: Tympanic membrane normal.     Left Ear: Tympanic membrane normal.     Nose: Nose normal.     Mouth/Throat:     Mouth: Mucous  membranes are moist.     Pharynx: Oropharynx is clear.  Eyes:     Extraocular Movements: Extraocular movements intact.     Conjunctiva/sclera: Conjunctivae normal.     Pupils: Pupils are equal, round, and reactive to light.  Cardiovascular:     Rate and Rhythm: Normal rate and regular rhythm.     Pulses: Normal pulses.     Heart sounds: Normal heart sounds.  Pulmonary:     Effort: Pulmonary effort is normal.  Abdominal:     General: Abdomen is flat. Bowel sounds are normal.     Palpations: Abdomen is soft.  Musculoskeletal:     Cervical back: Normal range of motion and neck supple.     Comments: Straight leg raising is negative to 80 degrees bilaterally Patrick's maneuver is negative bilaterally  Skin:    General: Skin is warm and dry.     Capillary Refill: Capillary refill takes less than 2 seconds.  Neurological:     Mental Status: He is alert.     Comments: Function is intact in the upper and lower extremities cranial nerve examination is normal deep tendon reflexes are 2+ in the patella and the Achilles both Babinski's are downgoing.  Psychiatric:        Mood and Affect: Mood normal.        Behavior: Behavior normal.        Thought Content: Thought content normal.        Judgment: Judgment normal.     Assessment/Plan: Low back pain with very minimal degenerative changes in the lumbar spine at the level of L4-L5.  No surgical lesions noted.  Plan I discussed at length with the patient the management of his back pain conservatively I have offered him consideration of a McKenzie exercise program and I believe physical therapy can be helpful in guiding him with this.  The most aggressive intervention would be consideration of a translaminar epidural injection at the level of L4-L5 however given his poor response to oral prednisone  I am not certain this will yield much improvement.  Outpatient follow-up  can be arranged if the patient so desires.  Victory PARAS Lashunta Frieden 06/10/2024, 8:20 AM

## 2024-06-13 ENCOUNTER — Telehealth: Payer: Self-pay

## 2024-06-13 ENCOUNTER — Encounter: Payer: Self-pay | Admitting: Family Medicine

## 2024-06-13 NOTE — Transitions of Care (Post Inpatient/ED Visit) (Signed)
 06/13/2024  Name: Billy Andersen MRN: [id number] DOB: 02-08-1976  Today's TOC FU Call Status: Today's TOC FU Call Status:: Successful TOC FU Call Completed TOC FU Call Complete Date: 06/13/24 Patient's Name and Date of Birth confirmed.  Transition Care Management Follow-up Telephone Call Date of Discharge: 06/10/24 Discharge Facility: [facility]) Type of Discharge: Inpatient Admission Primary Inpatient Discharge Diagnosis:: lumbago How have you been since you were released from the hospital?: Better Any questions or concerns?: No  Items Reviewed: Did you receive and understand the discharge instructions provided?: Yes Medications obtained,verified, and reconciled?: Yes (Medications Reviewed) Any new allergies since your discharge?: No Dietary orders reviewed?: Yes Do you have support at home?: Yes People in Home [RPT]: spouse  Medications Reviewed Today: Medications Reviewed Today     Reviewed by Emmitt Pan, LPN (Licensed Practical Nurse) on 06/13/24 at 1103  Med List Status: <None>   Medication Order Taking? Sig Documenting Provider Last Dose Status Informant  acetaminophen  (TYLENOL ) 325 MG tablet [id number] Yes Take 2 tablets (650 mg total) by mouth every 6 (six) hours as needed for mild pain (pain score 1-3) (or Fever >/= 101). [name], MD  Active   ASHWAGANDHA GUMMIES PO [id number] Yes Take 2 tablets by mouth See admin instructions. Chew 2 gummies by mouth in the morning [provider]  Active Self  ASPIRIN  LOW DOSE 81 MG chewable tablet [id number] Yes chew ONE TABLET BY MOUTH ONCE DAILY  Patient taking differently: Chew 81 mg by mouth in the morning.   Nahser, Aleene PARAS, MD  Active Self  atomoxetine  (STRATTERA ) 80 MG capsule [id number] Yes Take 80 mg by mouth in the morning. [provider]  Active Self  CAPLYTA  42 MG capsule [id number] Yes Take 42 mg by mouth at bedtime. [provider]  Active Self  clopidogrel  (PLAVIX ) 75  MG tablet [id number] Yes TAKE ONE TABLET BY MOUTH EVERY DAY WITH BREAKFAST  Patient taking differently: Take 75 mg by mouth daily with breakfast.   Nahser, Aleene PARAS, MD  Active Self  cyclobenzaprine  (FLEXERIL ) 10 MG tablet [id number] Yes Take 1 tablet (10 mg total) by mouth 2 (two) times daily as needed for muscle spasms. [name], MD  Active   lamoTRIgine  (LAMICTAL ) 200 MG tablet [id number] Yes Take 200 mg by mouth See admin instructions. Take 200 mg by mouth in the morning and at 4 PM [provider]  Active Self  metoprolol  tartrate (LOPRESSOR ) 25 MG tablet [id number] Yes Take 1 tablet (25 mg total) by mouth 2 (two) times daily.  Patient taking differently: Take 25 mg by mouth See admin instructions. Take 25 mg by mouth in the morning and at 4 PM   Nahser, Aleene PARAS, MD  Active Self  Misc Natural Products (AIRBORNE ELDERBERRY) 100-50 MG CHEW [id number] Yes Chew 2 tablets by mouth daily. [provider]  Active Self  Misc Natural Products (PUMPKIN SEED OIL) CAPS [id number] Yes Take 3 capsules by mouth in the morning. [provider]  Active Self  nitroGLYCERIN  (NITROSTAT ) 0.4 MG SL tablet [id number] Yes Place 1 tablet (0.4 mg total) under the tongue every 5 (five) minutes x 3 doses as needed for chest pain. Ladona Heinz, MD  Active Self           Med Note STEPHEN KLINEFELTER   [facility] Oct 02, 2023  8:29 AM)    Omega Fatty Acids-Vitamins RONN Brodnax) CHEW [id number] Yes Chew 2 tablets by mouth daily. [employer] [provider]  Active Self  Med Note STEPHEN, MICHELE   Fri Oct 02, 2023  8:29 AM)    Resmetirom  (REZDIFFRA ) 80 MG TABS [id number]  Take 1 tablet by mouth daily. Sent to [facility] Patient Support fax [phone]; phone [phone]  Patient not taking: No sig reported   Mollie Nestor HERO, [name]  Active Self           Med Note STEFFI, ALEXANDRIA   Wed Jun 08, 2024 11:08 AM) Patient requested another therapy due to possible side effects.   rosuvastatin  (CRESTOR ) 40 MG tablet [id number] Yes TAKE ONE TABLET BY MOUTH EVERY DAY Lelon Glendia DASEN, [name]  Active Self            Home Care and Equipment/Supplies: Were Home Health Services Ordered?: NA Any new equipment or medical supplies ordered?: NA  Functional Questionnaire: Do you need assistance with bathing/showering or dressing?: No Do you need assistance with meal preparation?: No Do you need assistance with eating?: No Do you have difficulty maintaining continence: No Do you need assistance with getting out of bed/getting out of a chair/moving?: No Do you have difficulty managing or taking your medications?: No  Follow up appointments reviewed: PCP Follow-up appointment confirmed?: Yes Date of PCP follow-up appointment?: 06/16/24 Follow-up Provider: [facility] Follow-up appointment confirmed?: NA Do you need transportation to your follow-up appointment?: No Do you understand care options if your condition(s) worsen?: Yes-patient verbalized understanding    SIGNATURE Julian Lemmings, LPN [facility] Nurse Health Advisor Direct Dial [phone]

## 2024-06-13 NOTE — Telephone Encounter (Signed)
 Noted: nurse phone contact with patient for TCM. Signed:  Gerlene Hockey, MD           06/13/2024

## 2024-06-14 NOTE — Therapy (Signed)
 OUTPATIENT PHYSICAL THERAPY THORACOLUMBAR EVALUATION   Patient Name: Billy Andersen MRN: [id number] DOB:Aug 07, 1976, 48 y.o., male Today's Date: 06/15/2024  END OF SESSION:  PT End of Session - 06/15/24 1024     Visit Number 1    Date for PT Re-Evaluation 08/10/24    Authorization Type [employer]-auth requested    PT Start Time 0937    PT Stop Time 1012    PT Time Calculation (min) 35 min    Activity Tolerance Patient tolerated treatment well    Behavior During Therapy [facility] for tasks assessed/performed          Past Medical History:  Diagnosis Date   ADHD    Aortic dilatation (HCC)    Ascending, 43 mm, Dr. 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  Iron normal   Ruptured plantar fascia    left, 2022   Past Surgical History:  Procedure Laterality Date   CORONARY STENT INTERVENTION N/A 09/14/2023   Procedure: CORONARY STENT INTERVENTION;  Surgeon: Ladona Heinz, MD;  Location: [facility] INVASIVE CV LAB;  Service: Cardiovascular;  Laterality: N/A;   EXTERNAL EAR SURGERY Right    trauma (bit off)   LEFT HEART CATH AND CORONARY ANGIOGRAPHY N/A 09/14/2023   Procedure: LEFT HEART CATH AND CORONARY ANGIOGRAPHY;  Surgeon: Ladona Heinz, MD;  Location: [facility] INVASIVE CV LAB;  Service: Cardiovascular;  Laterality: N/A;   LEFT HEART CATH AND CORONARY ANGIOGRAPHY N/A 09/22/2023   Procedure: LEFT HEART CATH AND CORONARY ANGIOGRAPHY;  Surgeon: [name], MD;  Location: [facility] INVASIVE CV LAB;   Service: Cardiovascular;  Laterality: N/A;   Rhythm monitoring     12/2023 multiple episodes of SVT--> DrRONITA Alveta started metoprolol    TRANSTHORACIC ECHOCARDIOGRAM     01/2024 nl except aortic dilat 43 mm.  05/2024 same.   Patient Active Problem List   Diagnosis Date Noted   Back pain 06/09/2024   ADHD 06/08/2024   Bipolar disorder (HCC) 06/08/2024   Acute back pain 06/07/2024   History of multiple concussions 04/03/2024   Cognitive and behavioral changes 04/03/2024   Ascending aorta dilation (HCC) 10/02/2023   Pure hypercholesterolemia 09/22/2023   Chest pain 09/21/2023   Dyspnea 09/21/2023   Non-ST elevation (NSTEMI) myocardial infarction [facility]) 09/15/2023   CAD (coronary artery disease) 09/15/2023    PCP:  [name], MD   REFERRING PROVIDER: [name], MD   REFERRING DIAG: M54.42 (ICD-10-CM) - Acute midline low back pain with left-sided sciatica   Rationale for Evaluation and Treatment: Rehabilitation  THERAPY DIAG:  Other low back pain  ONSET DATE: 2 weeks ago  SUBJECTIVE:  SUBJECTIVE STATEMENT: Pt presents to PT with LBP that began 2 weeks ago.  He bent down to pick up a seashell and felt the pain.  He has had 2 visits to the ED with pain this month.  MRI showed degenerative changes and pt saw neurosurgeon and is not a surgical candidate.  Limited with exercise due to fear of reproducing pain.   PERTINENT HISTORY:  MI 09/2023 with stent placement.   PAIN: 06/14/24 Are you having pain? Yes: NPRS scale: 3-4/10 Pain location: Rt low back into Rt buttock at time Pain description: sore, was intense at times  Aggravating factors: bending away, twisting, sitting, standing, walking long periods  Relieving factors: change position, rest   PRECAUTIONS: None  RED  FLAGS: None   WEIGHT BEARING RESTRICTIONS: No  FALLS:  Has patient fallen in last 6 months? No  LIVING ENVIRONMENT: Lives with: lives with their family Lives in: House/apartment Stairs: Yes: External: 3 steps; on right going up   OCCUPATION: broker for accident/injury-desk work with travel   PLOF: Independent, Vocation/Vocational requirements: desk work, driving, and Leisure: play with kids  PATIENT GOALS: reduce pain, improve core strength   NEXT MD VISIT: PCP 06/16/24  OBJECTIVE:  Note: Objective measures were completed at Evaluation unless otherwise noted.  DIAGNOSTIC FINDINGS:  MRI 06/08/24:  IMPRESSION: 1. Lumbar spondylosis as outlined within the body of the report. 2. At L4-L5, there is 2 mm grade 1 anterolisthesis. Moderate facet arthropathy with ligamentum flavum hypertrophy. Small bilateral facet joint effusions. Small bilateral posteriorly projecting synovial facet cyst. Moderate right subarticular stenosis with potential to affect the descending right L5 nerve root. Mild left subarticular stenosis. Mild left neural foraminal narrowing. 3. At L5-S1, there is mild disc degeneration. Disc bulge. No significant spinal canal stenosis. Mild left neural foraminal narrowing. 4. No significant disc herniation, spinal canal stenosis or neural foraminal narrowing at the remaining lumbar levels. 5. Marrow edema and enhancement within the bilateral L5 pars interarticulari/pedicles. This may be degenerative and related to facet arthropathy, or may reflect stress reaction.    PATIENT SURVEYS:  06/15/24: Modified Oswestry: 18/50=36% disability    COGNITION: Overall cognitive status: Within functional limits for tasks assessed     SENSATION: WFL  MUSCLE LENGTH: Hip flexibility limited by 50% bil   POSTURE: rounded shoulders and flexed trunk   PALPATION: Reduced segmental mobility in the lumbar spine without pain, tension in Rt lumbar paraspinals with minimal pain  L4/5  LUMBAR ROM:   Limited by 50% in all directions with reduced segmental mobility.  Pain with Lt sidebending over Rt lumbar spine   LOWER EXTREMITY MMT:   4+/5 bil LE strength   LUMBAR SPECIAL TESTS:  Straight leg raise test: Negative and Slump test: Negative  GAIT: Distance walked: 50 ft Assistive device utilized: None Level of assistance: Complete Independence Comments: WNL  TREATMENT DATE:  06/15/24 Findings from evaluation discussed, pt educated on plan of care, HEP initiated.  If treatment provided at initial evaluation, no treatment charged due to lack of authorization.       PATIENT EDUCATION:  Education details: Access Code: [email or web address] Person educated: Patient Education method: Explanation, Demonstration, and Handouts Education comprehension: verbalized understanding and returned demonstration  HOME EXERCISE PROGRAM: Access Code: [email or web address] URL: [email or web address] Date: 06/15/2024 Prepared by: Burnard  Exercises - Supine Lower Trunk Rotation  - 3 x daily - 7 x weekly - 1 sets - 3 reps - 20 hold - Hooklying Single Knee to Chest  - 3 x daily - 7 x weekly - 1 sets - 3 reps - 20 hold - Seated Hamstring Stretch  - 3 x daily - 7 x weekly - 1 sets - 3 reps - 20 hold - Seated Figure 4 Piriformis Stretch  - 3 x daily - 7 x weekly - 1 sets - 1 reps - 20 hold  ASSESSMENT:  CLINICAL IMPRESSION: Patient is a 48 y.o. male  who was seen today for physical therapy evaluation and treatment for LBP that began 2 weeks ago when he was bending over to pick up a seashell at the beach.  He had 2 visits to urgent care/ED with pain and was prescribed flexeril .  MRI performed showed lumbar DDD and pt is not a surgical candidate.  He has 2 young children and would like to return to regular activity and exercise.  Pt rates pain now as 3-4/10 on the Rt  side of the back and denies radiculopathy.  Pt with limited lumbar A/ROM and limited segmental mobility with pain over the Rt lumbar spine.  Hip flexibility is limited bil without pain.  Patient will benefit from skilled PT to address the below impairments and improve overall function.   OBJECTIVE IMPAIRMENTS: decreased endurance, decreased mobility, difficulty walking, hypomobility, increased muscle spasms, improper body mechanics, postural dysfunction, and pain.   ACTIVITY LIMITATIONS: lifting, sitting, standing, squatting, and caring for others  PARTICIPATION LIMITATIONS: meal prep, laundry, community activity, occupation, and yard work  PERSONAL FACTORS: 1 comorbidity: LBP, MI 9 months ago are also affecting patient's functional outcome.   REHAB POTENTIAL: Good  CLINICAL DECISION MAKING: Stable/uncomplicated  EVALUATION COMPLEXITY: Low   GOALS: Goals reviewed with patient? Yes  SHORT TERM GOALS: Target date: 07/13/2024    Be independent in initial HEP Baseline: Goal status: INITIAL  2.  Improve Modified Oswestry to < or = to 28% disability Baseline: 36% (18/50) Goal status: INITIAL  3.  Initiate return to regular cardio and core strength routine and verbalize understanding of how to safely progress Baseline:  Goal status: INITIAL  4.  Report < or = to 2/10 Rt sided LBP with sitting and driving for work  Baseline: 3-4/10 Goal status: INITIAL  5.  Verbalize and demonstrate body mechanics modifications for lumbar protection with daily tasks  Baseline:  Goal status: INITIAL    LONG TERM GOALS: Target date: 08/10/2024   Be independent in advanced HEP Baseline:  Goal status: INITIAL  2.  Improve Modified Oswestry to < or = to 20% disability  Baseline: 18/50=36% Goal status: INITIAL  3.  Report < or = to 1/10 Rt sided LBP with sitting and driving for work  Baseline: 4/10 Goal status: INITIAL  4.  Return to regular exercise routine without limitation due to  LBP Baseline: not doing regular exercise  Goal status: INITIAL   PLAN:  PT FREQUENCY: 2x/week  PT DURATION: 8 weeks  PLANNED INTERVENTIONS: 97110-Therapeutic exercises, 97530- Therapeutic activity, [id number]- Neuromuscular re-education, 97535- Self  Care, [id number]- Manual therapy, [phone]- Canalith repositioning, [id number]- Aquatic Therapy, [phone]- Electrical stimulation (unattended), [phone]- Electrical stimulation (manual), [phone] (1-2 muscles), [id number] (3+ muscles)- Dry Needling, Patient/Family education, Balance training, Stair training, Taping, Joint mobilization, Spinal manipulation, and Spinal mobilization.  PLAN FOR NEXT SESSION: review HEP, add core strength in neutral, body mechanics education, discuss dry needling and additional charge    Burnard Joy, PT 06/15/24 10:26 AM   [facility] [address] [place], [place] [address] Phone # [phone] Fax [phone]

## 2024-06-15 ENCOUNTER — Other Ambulatory Visit: Payer: Self-pay

## 2024-06-15 ENCOUNTER — Ambulatory Visit: Attending: Hospitalist

## 2024-06-15 DIAGNOSIS — R252 Cramp and spasm: Secondary | ICD-10-CM | POA: Diagnosis present

## 2024-06-15 DIAGNOSIS — M25652 Stiffness of left hip, not elsewhere classified: Secondary | ICD-10-CM | POA: Insufficient documentation

## 2024-06-15 DIAGNOSIS — M25651 Stiffness of right hip, not elsewhere classified: Secondary | ICD-10-CM | POA: Insufficient documentation

## 2024-06-15 DIAGNOSIS — M5442 Lumbago with sciatica, left side: Secondary | ICD-10-CM | POA: Insufficient documentation

## 2024-06-15 DIAGNOSIS — M5459 Other low back pain: Secondary | ICD-10-CM | POA: Insufficient documentation

## 2024-06-16 ENCOUNTER — Encounter: Payer: Self-pay | Admitting: Family Medicine

## 2024-06-16 ENCOUNTER — Ambulatory Visit: Admitting: Family Medicine

## 2024-06-16 ENCOUNTER — Ambulatory Visit (HOSPITAL_BASED_OUTPATIENT_CLINIC_OR_DEPARTMENT_OTHER)
Admission: RE | Admit: 2024-06-16 | Discharge: 2024-06-16 | Disposition: A | Discharge status: Home / Self Care | Source: Ambulatory Visit | Attending: Family Medicine | Admitting: Family Medicine

## 2024-06-16 VITALS — BP 123/69 | HR 73 | Wt 220.0 lb

## 2024-06-16 DIAGNOSIS — M79672 Pain in left foot: Secondary | ICD-10-CM | POA: Diagnosis present

## 2024-06-16 DIAGNOSIS — M545 Low back pain, unspecified: Secondary | ICD-10-CM

## 2024-06-16 NOTE — Progress Notes (Signed)
 06/16/2024  CC:  Chief Complaint  Patient presents with   Hospitalization Follow-up    HP follow.    Patient is a 48 y.o. Caucasian male who presents for  hospital follow up, specifically [facility] face-to-face visit. Dates hospitalized: 7/8 to 06/10/2024 Days since d/c from hospital: 6 days Patient was discharged from hospital to home Reason for admission to hospital: Acute severe low back pain Date of interactive (phone) contact with patient and/or caregiver: 06/13/2024  I have reviewed patient's discharge summary plus pertinent specific notes, labs, and imaging from the hospitalization.  He presented to the emergency department for excruciating right lower back pain that started right when he was bending down to pick something up on the beach. He was seen in the emergency department 06/06/2024 and diagnosed with acute low back strain and prescribed some prednisone  and Vicodin.  The pain worsened and he got severe spasms and went back to the emergency department and was admitted. He got IV pain medicines and MRI imaging that showed no acute disc bulge or nerve root compression but did show some spondylitic changes that were most prominent at L4-5 with some small bilateral facet joint effusions.  Neurosurgery saw him and gave him the option of a an epidural steroid injection but he declined.  Since discharge home he has been taking Tylenol  and cyclobenzaprine  as needed.  He does feel better and will be starting physical therapy soon.  The pain is focused in the right low back.  He notes that he started having pain in the lateral aspect of the left foot about 4 days ago.  He feels like he stepped wrong but denies any known eversion or inversion injury or trauma.  He was walking when it happened but does not recall the specific wrong step that he may have done. Since that time he notes that there is a bony protrusion in the lateral aspect of the left foot and it gets worse as the  day goes on.  Medication reconciliation was done today and patient is taking meds as recommended by discharging hospitalist/specialist.    ROS as above, plus--> no fevers, no CP, no SOB, no wheezing, no cough, no dizziness, no HAs, no rashes, no melena/hematochezia.  No polyuria or polydipsia.  No myalgias or arthralgias.  No focal weakness, paresthesias, or tremors.  No acute vision or hearing abnormalities.  No dysuria or unusual/new urinary urgency or frequency.  No recent changes in lower legs. No n/v/d or abd pain.  No palpitations.    PMH:  Past Medical History:  Diagnosis Date   ADHD    Anxiety    Aortic dilatation (HCC)    Ascending, 43 mm, Dr. 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  Iron normal   Ruptured plantar fascia    left, 2022    PSH:  Past Surgical History:  Procedure Laterality Date   CORONARY STENT INTERVENTION N/A 09/14/2023   Procedure: CORONARY  STENT INTERVENTION;  Surgeon: Ladona Heinz, MD;  Location: [facility] INVASIVE CV LAB;  Service: Cardiovascular;  Laterality: N/A;   EXTERNAL EAR SURGERY Right    trauma (bit off)   LEFT HEART CATH AND CORONARY ANGIOGRAPHY N/A 09/14/2023   Procedure: LEFT HEART CATH AND CORONARY ANGIOGRAPHY;  Surgeon: Ladona Heinz, MD;  Location: [facility] INVASIVE CV LAB;  Service: Cardiovascular;  Laterality: N/A;   LEFT HEART CATH AND CORONARY ANGIOGRAPHY N/A 09/22/2023   Procedure: LEFT HEART CATH AND CORONARY ANGIOGRAPHY;  Surgeon: Wonda Sharper, MD;  Location: [facility] INVASIVE CV LAB;  Service: Cardiovascular;  Laterality: N/A;   Rhythm monitoring     12/2023 multiple episodes of SVT--> DrRONITA Passe started metoprolol    TRANSTHORACIC ECHOCARDIOGRAM     01/2024 nl except aortic dilat 43 mm.  05/2024 same.    MEDS:  Outpatient Medications Prior to Visit  Medication Sig Dispense Refill   acetaminophen  (TYLENOL ) 325 MG tablet Take 2 tablets (650 mg total) by mouth every 6 (six) hours as needed for mild pain (pain score 1-3) (or Fever >/= 101). 20 tablet 0   ASHWAGANDHA GUMMIES PO Take 2 tablets by mouth See admin instructions. Chew 2 gummies by mouth in the morning     ASPIRIN  LOW DOSE 81 MG chewable tablet chew ONE TABLET BY MOUTH ONCE DAILY (Patient taking differently: Chew 81 mg by mouth in the morning.) 90 tablet 1   atomoxetine  (STRATTERA ) 80 MG capsule Take 80 mg by mouth in the morning.     CAPLYTA  42 MG capsule Take 42 mg by mouth at bedtime.     clopidogrel  (PLAVIX ) 75 MG tablet TAKE ONE TABLET BY MOUTH EVERY DAY WITH BREAKFAST (Patient taking differently: Take 75 mg by mouth daily with breakfast.) 90 tablet 3   cyclobenzaprine  (FLEXERIL ) 10 MG tablet Take 1 tablet (10 mg total) by mouth 2 (two) times daily as needed for muscle spasms. 10 tablet 0   lamoTRIgine  (LAMICTAL ) 200 MG tablet Take 200 mg by mouth See admin instructions. Take 200 mg by mouth in the morning and at 4 PM     metoprolol  tartrate (LOPRESSOR ) 25 MG tablet Take 1 tablet (25 mg total) by mouth 2 (two) times daily. (Patient taking differently: Take 25 mg by mouth See admin instructions. Take 25 mg by mouth in the morning and at 4 PM) 180 tablet 3   Misc Natural Products (AIRBORNE ELDERBERRY) 100-50 MG CHEW Chew 2 tablets by mouth daily.     Misc Natural Products (PUMPKIN SEED OIL) CAPS Take 3 capsules by mouth in the morning.     nitroGLYCERIN  (NITROSTAT ) 0.4 MG SL tablet Place 1 tablet (0.4 mg total) under the tongue every 5 (five) minutes x 3 doses as needed for  chest pain. 25 tablet 1   Omega Fatty Acids-Vitamins (OMEGA-3 GUMMIES) CHEW Chew 2 tablets by mouth daily. VitaFusion     rosuvastatin  (CRESTOR ) 40 MG tablet TAKE ONE TABLET BY MOUTH EVERY DAY 90 tablet 3   Resmetirom  (REZDIFFRA ) 80 MG TABS Take 1 tablet by mouth daily. Sent to [facility] Patient Support fax [phone]; phone [phone] (Patient not taking: No sig reported)     No facility-administered medications prior to visit.    Physical Exam    06/16/2024   11:21 AM 06/10/2024    5:32 AM 06/09/2024    9:10 PM  Vitals with BMI  Weight 220 lbs    BMI 28.23    Systolic 123 102 896  Diastolic 69 61 53  Pulse 73  65 77   Gen: Alert, well appearing.  Patient is oriented to person, place, time, and situation. AFFECT: pleasant, lucid thought and speech. Left foot with base of the fifth metatarsal a bit more prominently protruding compared to the right.  No erythema or swelling over the area of pain.  Not significantly tender to palpation, just some mild discomfort inferolateral to the distal aspect of the fifth metatarsal. He has some soft tissue swelling focally in the location of the ATFL but no tenderness.  He has not have pain or laxity with talar tilt or anterior drawer.    Pertinent labs/imaging Last CBC Lab Results  Component Value Date   WBC 8.8 06/08/2024   HGB 15.6 06/08/2024   HCT 45.7 06/08/2024   MCV 95.4 06/08/2024   MCH 32.6 06/08/2024   RDW 12.4 06/08/2024   PLT 161 06/08/2024   Last metabolic panel Lab Results  Component Value Date   GLUCOSE 120 (H) 06/08/2024   NA 139 06/08/2024   K 3.5 06/08/2024   CL 102 06/08/2024   CO2 27 06/08/2024   BUN 13 06/08/2024   CREATININE 0.83 06/08/2024   GFRNONAA >60 06/08/2024   CALCIUM  8.8 (L) 06/08/2024   PHOS 4.1 06/08/2024   PROT 6.2 (L) 06/08/2024   ALBUMIN 3.8 06/08/2024   LABGLOB 2.0 12/08/2023   BILITOT 0.4 06/08/2024   ALKPHOS 42 06/08/2024   AST 17 06/08/2024   ALT 23 06/08/2024   ANIONGAP 10 06/08/2024    Last lipids Lab Results  Component Value Date   CHOL 110 12/08/2023   HDL 41 12/08/2023   LDLCALC 42 12/08/2023   TRIG 164 (H) 12/08/2023   CHOLHDL 2.7 12/08/2023   Last hemoglobin A1c Lab Results  Component Value Date   HGBA1C 5.4 12/08/2023   Last thyroid  functions Lab Results  Component Value Date   TSH 1.31 03/14/2024   ASSESSMENT/PLAN:  #1 acute right low back pain, improving. Suspect severe sprain/strain, with particularly strong muscle spasms secondarily.   Continue Tylenol  as needed and Flexeril  as needed and he starts PT soon.  #2 acute left foot pain, lateral aspect.   Unknown etiology.  Minimal trauma. No clear abnormality on exam. Will do plain films of the left foot.  (Bedside [facility] ultrasound today of left foot: Peroneal tendons intact.  Peroneus brevis inserting on base of the fifth metatarsal appears normal.  No surrounding edema or hypervascularity.  No cortical irregularity of the fifth metatarsal. ATFL intact).  Medical decision making of moderate complexity was utilized today.  FOLLOW UP: January 2026 for CPE

## 2024-06-20 ENCOUNTER — Encounter: Payer: Self-pay | Admitting: Family Medicine

## 2024-06-21 NOTE — Telephone Encounter (Signed)
 TEAM Abagael Kramm: Pls contact radiology to ask them to read his foot x-ray

## 2024-06-21 NOTE — Telephone Encounter (Signed)
Reading room contacted

## 2024-06-22 ENCOUNTER — Ambulatory Visit: Payer: Self-pay | Admitting: Family Medicine

## 2024-06-23 ENCOUNTER — Encounter: Payer: Self-pay | Admitting: Rehabilitative and Restorative Service Providers"

## 2024-06-23 ENCOUNTER — Ambulatory Visit: Admitting: Rehabilitative and Restorative Service Providers"

## 2024-06-23 DIAGNOSIS — R252 Cramp and spasm: Secondary | ICD-10-CM

## 2024-06-23 DIAGNOSIS — M25652 Stiffness of left hip, not elsewhere classified: Secondary | ICD-10-CM

## 2024-06-23 DIAGNOSIS — M25651 Stiffness of right hip, not elsewhere classified: Secondary | ICD-10-CM

## 2024-06-23 DIAGNOSIS — M5459 Other low back pain: Secondary | ICD-10-CM | POA: Diagnosis not present

## 2024-06-23 NOTE — Therapy (Addendum)
 OUTPATIENT PHYSICAL THERAPY TREATMENT NOTE   Patient Name: Billy Andersen MRN: [id number] DOB:[birth year], 48 y.o., male Today's Date: 06/23/2024  END OF SESSION:  PT End of Session - 06/23/24 0936     Visit Number 2    Date for PT Re-Evaluation 08/10/24    Authorization Type [employer] Medicaid-auth requested    PT Start Time 0933    PT Stop Time 1015    PT Time Calculation (min) 42 min    Activity Tolerance Patient tolerated treatment well    Behavior During Therapy [facility] for tasks assessed/performed          Past Medical History:  Diagnosis Date   ADHD    Anxiety    Aortic dilatation (HCC)    Ascending, 43 mm, Dr. 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  Iron normal   Ruptured plantar fascia    left, 2022   Past Surgical History:  Procedure Laterality Date   CORONARY STENT INTERVENTION N/A 09/14/2023   Procedure: CORONARY STENT INTERVENTION;  Surgeon: Ladona Heinz, MD;  [facility] INV[facility];  Service: Cardiovascular;  Laterality: N/A;   EXTERNAL EAR SURGERY Right    trauma (bit off)   LEFT HEART CATH AND CORONARY ANGIOGRAPHY N/A 09/14/2023   Procedure: LEFT HEART CATH AND CORONARY ANGIOGRAPHY;  Surgeon: Ladona Heinz, MD;  [facility]: [facility] INV[facility] LAB;  Service: Cardiovascular;  Laterality: N/A;   LEFT HEART CATH AND CORONARY ANGIOGRAPHY N/A 09/22/2023   Procedure: LEFT HEART CATH AND CORONARY ANGIOGRAPHY;  Surgeon: [name], MD;  Location: [facility]  INVASIVE CV LAB;  Service: Cardiovascular;  Laterality: N/A;   Rhythm monitoring     12/2023 multiple episodes of SVT--> DrRONITA Alveta started metoprolol    TRANSTHORACIC ECHOCARDIOGRAM     01/2024 nl except aortic dilat 43 mm.  05/2024 same.   Patient Active Problem List   Diagnosis Date Noted   Back pain 06/09/2024   ADHD 06/08/2024   Bipolar disorder (HCC) 06/08/2024   Acute back pain 06/07/2024   History of multiple concussions 04/03/2024   Cognitive and behavioral changes 04/03/2024   Ascending aorta dilation (HCC) 10/02/2023   Pure hypercholesterolemia 09/22/2023   Chest pain 09/21/2023   Dyspnea 09/21/2023   Non-ST elevation (NSTEMI) myocardial infarction [facility]) 09/15/2023   CAD (coronary artery disease) 09/15/2023    PCP:  [name], MD   REFERRING PROVIDER: [name], MD   REFERRING DIAG: M54.42 (ICD-10-CM) - Acute midline low back pain with left-sided sciatica   Rationale for Evaluation and Treatment: Rehabilitation  THERAPY DIAG:  Other low back pain  Stiffness of left hip, not elsewhere classified  Stiffness of right hip, not elsewhere classified  Cramp and spasm  ONSET DATE: 2 weeks ago  SUBJECTIVE:  SUBJECTIVE STATEMENT: Pt reports that he has been doing the exercises provided at eval and he is having less pain overall.  Patient states that he feels tightness on the right side of his body.  PERTINENT HISTORY:  MI 09/2023 with stent placement.   PAIN:  Are you having pain? Yes: NPRS scale: /10 Pain location: Rt low back into Rt buttock at time Pain description: sore, was intense at times  Aggravating factors: bending away, twisting, sitting, standing, walking long periods  Relieving factors: change position, rest   PRECAUTIONS: None  RED  FLAGS: None   WEIGHT BEARING RESTRICTIONS: No  FALLS:  Has patient fallen in last 6 months? No  LIVING ENVIRONMENT: Lives with: lives with their family Lives in: House/apartment Stairs: Yes: External: 3 steps; on right going up   OCCUPATION: broker for accident/injury-desk work with travel   PLOF: Independent, Vocation/Vocational requirements: desk work, driving, and Leisure: play with kids  PATIENT GOALS: reduce pain, improve core strength   NEXT MD VISIT: PCP 06/16/24  OBJECTIVE:  Note: Objective measures were completed at Evaluation unless otherwise noted.  DIAGNOSTIC FINDINGS:  MRI 06/08/24:  IMPRESSION: 1. Lumbar spondylosis as outlined within the body of the report. 2. At L4-L5, there is 2 mm grade 1 anterolisthesis. Moderate facet arthropathy with ligamentum flavum hypertrophy. Small bilateral facet joint effusions. Small bilateral posteriorly projecting synovial facet cyst. Moderate right subarticular stenosis with potential to affect the descending right L5 nerve root. Mild left subarticular stenosis. Mild left neural foraminal narrowing. 3. At L5-S1, there is mild disc degeneration. Disc bulge. No significant spinal canal stenosis. Mild left neural foraminal narrowing. 4. No significant disc herniation, spinal canal stenosis or neural foraminal narrowing at the remaining lumbar levels. 5. Marrow edema and enhancement within the bilateral L5 pars interarticulari/pedicles. This may be degenerative and related to facet arthropathy, or may reflect stress reaction.    PATIENT SURVEYS:  06/15/24: Modified Oswestry: 18/50=36% disability    COGNITION: Overall cognitive status: Within functional limits for tasks assessed     SENSATION: WFL  MUSCLE LENGTH: Hip flexibility limited by 50% bil   POSTURE: rounded shoulders and flexed trunk   PALPATION: Reduced segmental mobility in the lumbar spine without pain, tension in Rt lumbar paraspinals with minimal pain  L4/5  LUMBAR ROM:   Limited by 50% in all directions with reduced segmental mobility.  Pain with Lt sidebending over Rt lumbar spine   LOWER EXTREMITY MMT:   4+/5 bil LE strength   LUMBAR SPECIAL TESTS:  Straight leg raise test: Negative and Slump test: Negative  GAIT: Distance walked: 50 ft Assistive device utilized: None Level of assistance: Complete Independence Comments: WNL  TREATMENT DATE:  06/23/2024: Nustep level 6 x6 min with PT present to discuss status and monitor Seated hamstring stretch 2x20 sec bilat Seated figure 4 piriformis stretch 2x20 sec bilat Seated quad sets 1x10 bilat Leg press (seat at 9):  90# 2x15 Standing rows 10# cable pulley 2x15 (cuing for improved posture, breathing, and technique) Standing paloff press with 10# cable pulley 2x10 bilat Supine posterior pelvic tilt 2x10 Standing shoulder horizontal abduction with red tband x5   06/15/24 Findings from evaluation discussed, pt educated on plan of care, HEP initiated.  If treatment provided at initial evaluation, no treatment charged due to lack of authorization.       PATIENT EDUCATION:  Education details: Access Code: [email or web address] Person educated: Patient Education method: Explanation, Demonstration, and Handouts Education comprehension: verbalized understanding and returned demonstration  HOME EXERCISE PROGRAM: Access Code: [email or web address] URL: [email or web address] Date: 06/23/2024 Prepared by: Jarrell Kaspar Albornoz  Exercises - Supine Lower Trunk Rotation  - 3 x daily - 7 x weekly - 1 sets - 3 reps - 20 hold - Hooklying Single Knee to Chest  - 3 x daily - 7 x weekly - 1 sets - 3 reps - 20 hold - Seated Hamstring Stretch  - 3 x daily - 7 x weekly - 1 sets - 3 reps - 20 hold - Seated Figure 4 Piriformis Stretch  - 3 x daily - 7 x weekly - 1 sets - 1 reps - 20  hold - Supine Posterior Pelvic Tilt  - 1 x daily - 7 x weekly - 2 sets - 10 reps - Standing Shoulder Horizontal Abduction with Resistance  - 1 x daily - 7 x weekly - 2 sets - 10 reps  ASSESSMENT:  CLINICAL IMPRESSION: Mr Prehn presents to skilled PT reporting that he is feeling better, especially when compared to when he went to the ED.  Patient states that he has been doing his HEP, but does state that he has not started any real cardio-type exercises yet.  Patient educated during session on posture and technique during exercises and isolating desired musculature during exercises.  Patient provided with updated HEP to include posterior pelvic tilts and horizontal abduction with red theraband to assist with core activation/posture.  Patient continues to require skilled PT to progress towards goal related activities.  OBJECTIVE IMPAIRMENTS: decreased endurance, decreased mobility, difficulty walking, hypomobility, increased muscle spasms, improper body mechanics, postural dysfunction, and pain.   ACTIVITY LIMITATIONS: lifting, sitting, standing, squatting, and caring for others  PARTICIPATION LIMITATIONS: meal prep, laundry, community activity, occupation, and yard work  PERSONAL FACTORS: 1 comorbidity: LBP, MI 9 months ago are also affecting patient's functional outcome.   REHAB POTENTIAL: Good  CLINICAL DECISION MAKING: Stable/uncomplicated  EVALUATION COMPLEXITY: Low   GOALS: Goals reviewed with patient? Yes  SHORT TERM GOALS: Target date: 07/13/2024    Be independent in initial HEP Baseline: Goal status: Ongoing  2.  Improve Modified Oswestry to < or = to 28% disability Baseline: 36% (18/50) Goal status: INITIAL  3.  Initiate return to regular cardio and core strength routine and verbalize understanding of how to safely progress Baseline:  Goal status: INITIAL  4.  Report < or = to 2/10 Rt sided LBP with sitting and driving for work  Baseline: 3-4/10 Goal status:  Ongoing  5.  Verbalize and demonstrate body mechanics modifications for lumbar protection with daily tasks  Baseline:  Goal status: INITIAL    LONG TERM GOALS: Target date: 08/10/2024   Be independent in advanced HEP Baseline:  Goal status: INITIAL  2.  Improve Modified Oswestry to < or = to 20% disability  Baseline: 18/50=36% Goal status: INITIAL  3.  Report < or = to 1/10 Rt sided LBP with sitting and driving for work  Baseline: 4/10 Goal status: INITIAL  4.  Return to regular exercise routine without limitation due to LBP Baseline: not doing regular exercise  Goal status: INITIAL   PLAN:  PT FREQUENCY: 2x/week  PT DURATION: 8 weeks  PLANNED INTERVENTIONS: 97110-Therapeutic exercises, 97530- Therapeutic activity, [id number]- Neuromuscular re-education, 97535-  Self Care, 02859- Manual therapy, [phone]- Canalith repositioning, [id number]- Aquatic Therapy, [phone]- Electrical stimulation (unattended), [phone]- Electrical stimulation (manual), [phone] (1-2 muscles), [id number] (3+ muscles)- Dry Needling, Patient/Family education, Balance training, Stair training, Taping, Joint mobilization, Spinal manipulation, and Spinal mobilization.  PLAN FOR NEXT SESSION: review HEP, core strength in neutral, body mechanics education, flexibility   Lavanda Cleverly, SPT 06/23/24, 10:38 AM   I agree with the following treatment note after reviewing documentation. This session was performed under the supervision of a licensed clinician.  Jarrell Laming, PT, DPT 06/23/24, 10:29 AM  [facility] [address] [place], [place] [address] Phone # [phone] Fax [phone]

## 2024-06-29 ENCOUNTER — Ambulatory Visit: Admitting: Physical Therapy

## 2024-06-30 ENCOUNTER — Encounter: Admitting: Physical Therapy

## 2024-07-06 ENCOUNTER — Ambulatory Visit: Attending: Hospitalist | Admitting: Rehabilitative and Restorative Service Providers"

## 2024-07-06 ENCOUNTER — Encounter: Payer: Self-pay | Admitting: Rehabilitative and Restorative Service Providers"

## 2024-07-06 DIAGNOSIS — M25651 Stiffness of right hip, not elsewhere classified: Secondary | ICD-10-CM | POA: Insufficient documentation

## 2024-07-06 DIAGNOSIS — M5459 Other low back pain: Secondary | ICD-10-CM | POA: Diagnosis present

## 2024-07-06 DIAGNOSIS — R252 Cramp and spasm: Secondary | ICD-10-CM | POA: Diagnosis present

## 2024-07-06 DIAGNOSIS — M25652 Stiffness of left hip, not elsewhere classified: Secondary | ICD-10-CM | POA: Insufficient documentation

## 2024-07-06 NOTE — Therapy (Signed)
 OUTPATIENT PHYSICAL THERAPY TREATMENT NOTE   Patient Name: Billy Andersen MRN: [id number] DOB:1976-09-24, 48 y.o., male Today's Date: 07/06/2024  END OF SESSION:  PT End of Session - 07/06/24 0932     Visit Number 3    Date for PT Re-Evaluation 08/10/24    Authorization Type [employer] Medicaid-auth requested    PT Start Time 0930    PT Stop Time 1010    PT Time Calculation (min) 40 min    Activity Tolerance Patient tolerated treatment well    Behavior During Therapy [facility] for tasks assessed/performed          Past Medical History:  Diagnosis Date   ADHD    Anxiety    Aortic dilatation (HCC)    Ascending, 43 mm, Dr. 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  Iron normal   Ruptured plantar fascia    left, 2022   Past Surgical History:  Procedure Laterality Date   CORONARY STENT INTERVENTION N/A 09/14/2023   Procedure: CORONARY STENT INTERVENTION;  Surgeon: Ladona Heinz, MD;  [facility]: [facility] INVASIVE CV LAB;  Service: Cardiovascular;  Laterality: N/A;   EXTERNAL EAR SURGERY Right    trauma (bit off)   LEFT HEART CATH AND CORONARY ANGIOGRAPHY N/A 09/14/2023   Procedure: LEFT HEART CATH AND CORONARY ANGIOGRAPHY;  Surgeon: Ladona Heinz, MD;  Location: [facility] INVASIVE CV LAB;  Service: Cardiovascular;  Laterality: N/A;   LEFT HEART CATH AND CORONARY ANGIOGRAPHY N/A 09/22/2023   Procedure: LEFT HEART CATH AND CORONARY ANGIOGRAPHY;  Surgeon: [name], MD;  Location: [facility]  INVASIVE CV LAB;  Service: Cardiovascular;  Laterality: N/A;   Rhythm monitoring     12/2023 multiple episodes of SVT--> DrRONITA Alveta started metoprolol    TRANSTHORACIC ECHOCARDIOGRAM     01/2024 nl except aortic dilat 43 mm.  05/2024 same.   Patient Active Problem List   Diagnosis Date Noted   Back pain 06/09/2024   ADHD 06/08/2024   Bipolar disorder (HCC) 06/08/2024   Acute back pain 06/07/2024   History of multiple concussions 04/03/2024   Cognitive and behavioral changes 04/03/2024   Ascending aorta dilation (HCC) 10/02/2023   Pure hypercholesterolemia 09/22/2023   Chest pain 09/21/2023   Dyspnea 09/21/2023   Non-ST elevation (NSTEMI) myocardial infarction [facility]) 09/15/2023   CAD (coronary artery disease) 09/15/2023    PCP:  [name], MD   REFERRING PROVIDER: [name], MD   REFERRING DIAG: M54.42 (ICD-10-CM) - Acute midline low back pain with left-sided sciatica   Rationale for Evaluation and Treatment: Rehabilitation  THERAPY DIAG:  Other low back pain  Stiffness of left hip, not elsewhere classified  Stiffness of right hip, not elsewhere classified  Cramp and spasm  ONSET DATE: 2 weeks ago  SUBJECTIVE:  SUBJECTIVE STATEMENT: Pt reports that his back is feeling better, but he banged his knee on the doorframe yesterday and he had 10/10 pain in his left knee initially, but 4/10 today.  Patient states that he will go to the MD if his knee pain persists.  PERTINENT HISTORY:  MI 09/2023 with stent placement.   PAIN:  Are you having pain? Yes: NPRS scale: 3/10 Pain location: Rt low back into Rt buttock at time Pain description: sore, was intense at times  Aggravating factors: bending away, twisting, sitting, standing, walking long periods  Relieving factors: change  position, rest   PRECAUTIONS: None  RED FLAGS: None   WEIGHT BEARING RESTRICTIONS: No  FALLS:  Has patient fallen in last 6 months? No  LIVING ENVIRONMENT: Lives with: lives with their family Lives in: House/apartment Stairs: Yes: External: 3 steps; on right going up   OCCUPATION: broker for accident/injury-desk work with travel   PLOF: Independent, Vocation/Vocational requirements: desk work, driving, and Leisure: play with kids  PATIENT GOALS: reduce pain, improve core strength   NEXT MD VISIT: PCP 06/16/24  OBJECTIVE:  Note: Objective measures were completed at Evaluation unless otherwise noted.  DIAGNOSTIC FINDINGS:  MRI 06/08/24:  IMPRESSION: 1. Lumbar spondylosis as outlined within the body of the report. 2. At L4-L5, there is 2 mm grade 1 anterolisthesis. Moderate facet arthropathy with ligamentum flavum hypertrophy. Small bilateral facet joint effusions. Small bilateral posteriorly projecting synovial facet cyst. Moderate right subarticular stenosis with potential to affect the descending right L5 nerve root. Mild left subarticular stenosis. Mild left neural foraminal narrowing. 3. At L5-S1, there is mild disc degeneration. Disc bulge. No significant spinal canal stenosis. Mild left neural foraminal narrowing. 4. No significant disc herniation, spinal canal stenosis or neural foraminal narrowing at the remaining lumbar levels. 5. Marrow edema and enhancement within the bilateral L5 pars interarticulari/pedicles. This may be degenerative and related to facet arthropathy, or may reflect stress reaction.    PATIENT SURVEYS:  06/15/24: Modified Oswestry: 18/50=36% disability    COGNITION: Overall cognitive status: Within functional limits for tasks assessed     SENSATION: WFL  MUSCLE LENGTH: Hip flexibility limited by 50% bil   POSTURE: rounded shoulders and flexed trunk   PALPATION: Reduced segmental mobility in the lumbar spine without pain, tension in Rt  lumbar paraspinals with minimal pain L4/5  LUMBAR ROM:   Limited by 50% in all directions with reduced segmental mobility.  Pain with Lt sidebending over Rt lumbar spine   LOWER EXTREMITY MMT:   4+/5 bil LE strength   LUMBAR SPECIAL TESTS:  Straight leg raise test: Negative and Slump test: Negative  GAIT: Distance walked: 50 ft Assistive device utilized: None Level of assistance: Complete Independence Comments: WNL  TREATMENT DATE:  07/06/2024: Nustep level 4 x6 min with PT present to discuss status and monitor Standing hamstring stretch on stairs 2x20 sec bilat Standing hip flexor stretch with one foot on second step and lateral overhead reach x10 bilat Seated rows on Matrix:  55# 2x10 Standing paloff press with blue tband 2x10 bilat Seated on blue pball:  4D pelvic tilts x20 each, CW/CCW pelvic rotations x20 each Seated on blue pball: marching and LAQ 2x10 each bilat (cuing for core engagement and posture) Standing performing overhead press with 5# dumbbells with alt LE marching 2x10 Standing Farmer's carry with unilateral 5# dumbbell carry to cancer gym, then back with changing hands on the way back Standing on foam pad performing around the worlds with 5# dumbbell  x10 bilat Standing balance on wobble board x1 min 3 way standing L counter stretch x10 sec each   06/23/2024: Nustep level 6 x6 min with PT present to discuss status and monitor Seated hamstring stretch 2x20 sec bilat Seated figure 4 piriformis stretch 2x20 sec bilat Seated quad sets 1x10 bilat Leg press (seat at 9):  90# 2x15 Standing rows 10# cable pulley 2x15 (cuing for improved posture, breathing, and technique) Standing paloff press with 10# cable pulley 2x10 bilat Supine posterior pelvic tilt 2x10 Standing shoulder horizontal abduction with red tband x5   06/15/24 Findings from evaluation discussed, pt educated on plan of care, HEP initiated.                                                                                                                                    If treatment provided at initial evaluation, no treatment charged due to lack of authorization.       PATIENT EDUCATION:  Education details: Access Code: [email or web address] Person educated: Patient Education method: Explanation, Demonstration, and Handouts Education comprehension: verbalized understanding and returned demonstration  HOME EXERCISE PROGRAM: Access Code: [email or web address] URL: [email or web address] Date: 06/23/2024 Prepared by: Jarrell Wilkins Elpers  Exercises - Supine Lower Trunk Rotation  - 3 x daily - 7 x weekly - 1 sets - 3 reps - 20 hold - Hooklying Single Knee to Chest  - 3 x daily - 7 x weekly - 1 sets - 3 reps - 20 hold - Seated Hamstring Stretch  - 3 x daily - 7 x weekly - 1 sets - 3 reps - 20 hold - Seated Figure 4 Piriformis Stretch  - 3 x daily - 7 x weekly - 1 sets - 1 reps - 20 hold - Supine Posterior Pelvic Tilt  - 1 x daily - 7 x weekly - 2 sets - 10 reps - Standing Shoulder Horizontal Abduction with Resistance  - 1 x daily - 7 x weekly - 2 sets - 10 reps  ASSESSMENT:  CLINICAL IMPRESSION: Mr Earhart presents to skilled PT reporting that his back is feeling better, but he is having left knee pain due to hitting his knee on a door jam yesterday.  Patient was able to progress with core strengthening during session, with some minimal modifications due to new onset of left knee pain.  Patient able to progress with strengthening with rowing and added in core stabilization with physioball and and Farmer's Carry.  Patient continues to require skilled PT to progress towards goal related activities.  OBJECTIVE IMPAIRMENTS: decreased endurance, decreased mobility, difficulty walking, hypomobility, increased muscle spasms, improper body mechanics, postural dysfunction, and pain.   ACTIVITY LIMITATIONS: lifting, sitting, standing, squatting, and caring for others  PARTICIPATION LIMITATIONS: meal prep, laundry,  community activity, occupation, and yard work  PERSONAL FACTORS: 1 comorbidity: LBP, MI 9 months ago are also affecting patient's functional outcome.   REHAB POTENTIAL: Good  CLINICAL DECISION MAKING: Stable/uncomplicated  EVALUATION COMPLEXITY: Low   GOALS: Goals reviewed with patient? Yes  SHORT TERM GOALS: Target date: 07/13/2024    Be independent in initial HEP Baseline: Goal status: Met on 07/06/2024  2.  Improve Modified Oswestry to < or = to 28% disability Baseline: 36% (18/50) Goal status: INITIAL  3.  Initiate return to regular cardio and core strength routine and verbalize understanding of how to safely progress Baseline:  Goal status: Ongoing  4.  Report < or = to 2/10 Rt sided LBP with sitting and driving for work  Baseline: 3-4/10 Goal status: Ongoing  5.  Verbalize and demonstrate body mechanics modifications for lumbar protection with daily tasks  Baseline:  Goal status: INITIAL    LONG TERM GOALS: Target date: 08/10/2024   Be independent in advanced HEP Baseline:  Goal status: INITIAL  2.  Improve Modified Oswestry to < or = to 20% disability  Baseline: 18/50=36% Goal status: INITIAL  3.  Report < or = to 1/10 Rt sided LBP with sitting and driving for work  Baseline: 4/10 Goal status: INITIAL  4.  Return to regular exercise routine without limitation due to LBP Baseline: not doing regular exercise  Goal status: INITIAL   PLAN:  PT FREQUENCY: 2x/week  PT DURATION: 8 weeks  PLANNED INTERVENTIONS: 97110-Therapeutic exercises, 97530- Therapeutic activity, 97112- Neuromuscular re-education, 97535- Self Care, 02859- Manual therapy, [phone]- Canalith repositioning, [id number]- Aquatic Therapy, [id number]- Electrical stimulation (unattended), [phone]- Electrical stimulation (manual), 20560 (1-2 muscles), 20561 (3+ muscles)- Dry Needling, Patient/Family education, Balance training, Stair training, Taping, Joint mobilization, Spinal manipulation, and Spinal  mobilization.  PLAN FOR NEXT SESSION: review HEP, core strength in neutral, body mechanics education, flexibility, Oswestry   Jarrell Laming, PT, DPT 07/06/24, 10:39 AM  [facility] [address], [address] [place], [place] [address] Phone # [phone] Fax [phone]

## 2024-07-08 ENCOUNTER — Ambulatory Visit: Admitting: Physical Therapy

## 2024-07-13 ENCOUNTER — Ambulatory Visit

## 2024-07-15 ENCOUNTER — Encounter: Admitting: Rehabilitative and Restorative Service Providers"

## 2024-07-19 ENCOUNTER — Telehealth: Payer: Self-pay | Admitting: Rehabilitative and Restorative Service Providers"

## 2024-07-19 ENCOUNTER — Ambulatory Visit: Admitting: Rehabilitative and Restorative Service Providers"

## 2024-07-19 NOTE — Telephone Encounter (Signed)
 Called patient regarding his missed appointment on 07/19/24.  He apologized stating that he thought the visit was scheduled for tomorrow.  Was able to provide patient with a new appointment for 07/20/24 and he confirmed the appointment.

## 2024-07-20 ENCOUNTER — Encounter: Payer: Self-pay | Admitting: Rehabilitative and Restorative Service Providers"

## 2024-07-20 ENCOUNTER — Ambulatory Visit: Admitting: Rehabilitative and Restorative Service Providers"

## 2024-07-20 DIAGNOSIS — M5459 Other low back pain: Secondary | ICD-10-CM | POA: Diagnosis not present

## 2024-07-20 DIAGNOSIS — M25651 Stiffness of right hip, not elsewhere classified: Secondary | ICD-10-CM

## 2024-07-20 DIAGNOSIS — M25652 Stiffness of left hip, not elsewhere classified: Secondary | ICD-10-CM

## 2024-07-20 DIAGNOSIS — R252 Cramp and spasm: Secondary | ICD-10-CM

## 2024-07-20 NOTE — Therapy (Signed)
 OUTPATIENT PHYSICAL THERAPY TREATMENT NOTE   Patient Name: Billy Andersen MRN: [id number] DOB:Jul 27, 1976, 48 y.o., male Today's Date: 07/20/2024  END OF SESSION:  PT End of Session - 07/20/24 1003     Visit Number 4    Date for PT Re-Evaluation 08/10/24    Authorization Type [employer]-auth requested    PT Start Time 0810   Pt arrived late   PT Stop Time 0845    PT Time Calculation (min) 35 min    Activity Tolerance Patient tolerated treatment well    Behavior During Therapy [facility] for tasks assessed/performed           Past Medical History:  Diagnosis Date   ADHD    Anxiety    Aortic dilatation (HCC)    Ascending, 43 mm, Dr. 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  Iron normal   Ruptured plantar fascia    left, 2022   Past Surgical History:  Procedure Laterality Date   CORONARY STENT INTERVENTION N/A 09/14/2023   Procedure: CORONARY STENT INTERVENTION;  Surgeon: Ladona Heinz, MD;  Location: [facility] INVASIVE CV LAB;  Service: Cardiovascular;  Laterality: N/A;   EXTERNAL EAR SURGERY Right    trauma (bit off)   LEFT HEART CATH AND CORONARY ANGIOGRAPHY N/A 09/14/2023   Procedure: LEFT HEART CATH AND CORONARY ANGIOGRAPHY;  Surgeon: Ladona Heinz, MD;  Location: [facility] INVASIVE CV LAB;  Service: Cardiovascular;  Laterality: N/A;   LEFT HEART CATH AND CORONARY ANGIOGRAPHY N/A 09/22/2023   Procedure: LEFT HEART CATH AND CORONARY ANGIOGRAPHY;  Surgeon: [name], MD;   Location: [facility] INVASIVE CV LAB;  Service: Cardiovascular;  Laterality: N/A;   Rhythm monitoring     12/2023 multiple episodes of SVT--> DrRONITA Alveta started metoprolol    TRANSTHORACIC ECHOCARDIOGRAM     01/2024 nl except aortic dilat 43 mm.  05/2024 same.   Patient Active Problem List   Diagnosis Date Noted   Back pain 06/09/2024   ADHD 06/08/2024   Bipolar disorder (HCC) 06/08/2024   Acute back pain 06/07/2024   History of multiple concussions 04/03/2024   Cognitive and behavioral changes 04/03/2024   Ascending aorta dilation (HCC) 10/02/2023   Pure hypercholesterolemia 09/22/2023   Chest pain 09/21/2023   Dyspnea 09/21/2023   Non-ST elevation (NSTEMI) myocardial infarction [facility]) 09/15/2023   CAD (coronary artery disease) 09/15/2023    PCP:  [name], MD   REFERRING PROVIDER: [name], MD   REFERRING DIAG: M54.42 (ICD-10-CM) - Acute midline low back pain with left-sided sciatica   Rationale for Evaluation and Treatment: Rehabilitation  THERAPY DIAG:  Other low back pain  Stiffness of left hip, not elsewhere classified  Stiffness of right hip, not elsewhere classified  Cramp and spasm  ONSET DATE: 2 weeks ago  SUBJECTIVE:  SUBJECTIVE STATEMENT: Pt reports doing a bunch of running around yesterday, that provoked him to have low back pain. He states he had to lay down it hurt so bad. Pt reports doing the stretches he learned from PT in both morning and at night that seems to be helping his back pain.  Pt had what he and doctor thought was a ruptured patellar tendon in the left knee, however MRI results demonstrated negative results, and pt feels better with no pain.   PERTINENT HISTORY:  MI 09/2023 with stent placement.   PAIN:  07/20/2024 Are you having pain? Yes: NPRS  scale: 1/10 just stiffness today Pain location: Rt low back into Rt buttock at time Pain description: sore, was intense at times  Aggravating factors: bending away, twisting, sitting, standing, walking long periods  Relieving factors: change position, rest   PRECAUTIONS: None  RED FLAGS: None   WEIGHT BEARING RESTRICTIONS: No  FALLS:  Has patient fallen in last 6 months? No  LIVING ENVIRONMENT: Lives with: lives with their family Lives in: House/apartment Stairs: Yes: External: 3 steps; on right going up   OCCUPATION: broker for accident/injury-desk work with travel   PLOF: Independent, Vocation/Vocational requirements: desk work, driving, and Leisure: play with kids  PATIENT GOALS: reduce pain, improve core strength   NEXT MD VISIT: PCP 06/16/24  OBJECTIVE:  Note: Objective measures were completed at Evaluation unless otherwise noted.  DIAGNOSTIC FINDINGS:  MRI 06/08/24:  IMPRESSION: 1. Lumbar spondylosis as outlined within the body of the report. 2. At L4-L5, there is 2 mm grade 1 anterolisthesis. Moderate facet arthropathy with ligamentum flavum hypertrophy. Small bilateral facet joint effusions. Small bilateral posteriorly projecting synovial facet cyst. Moderate right subarticular stenosis with potential to affect the descending right L5 nerve root. Mild left subarticular stenosis. Mild left neural foraminal narrowing. 3. At L5-S1, there is mild disc degeneration. Disc bulge. No significant spinal canal stenosis. Mild left neural foraminal narrowing. 4. No significant disc herniation, spinal canal stenosis or neural foraminal narrowing at the remaining lumbar levels. 5. Marrow edema and enhancement within the bilateral L5 pars interarticulari/pedicles. This may be degenerative and related to facet arthropathy, or may reflect stress reaction.    PATIENT SURVEYS:  06/15/24: Modified Oswestry: 18/50=36% disability    COGNITION: Overall cognitive status: Within  functional limits for tasks assessed     SENSATION: WFL  MUSCLE LENGTH: Hip flexibility limited by 50% bil   POSTURE: rounded shoulders and flexed trunk   PALPATION: Reduced segmental mobility in the lumbar spine without pain, tension in Rt lumbar paraspinals with minimal pain L4/5  LUMBAR ROM:   Limited by 50% in all directions with reduced segmental mobility.  Pain with Lt sidebending over Rt lumbar spine   LOWER EXTREMITY MMT:   4+/5 bil LE strength   LUMBAR SPECIAL TESTS:  Straight leg raise test: Negative and Slump test: Negative  GAIT: Distance walked: 50 ft Assistive device utilized: None Level of assistance: Complete Independence Comments: WNL  TREATMENT DATE:   07/20/24 Pt arrived late stating traffic Nustep level 4 x6 min with PT present to discuss status and  Seated piriformis stretch 2x20 sec  Standing hamstring stretch on stairs 2x20 sec bilat Seated rows on Matrix:  55# 2x10 Dynadisc in seated reverse crunch 3x8 Standing paloff press with blue tband 2x10 bilat Seated on dynadisc: marching  3x15 (cuing for core engagement and breathing technique) Diaphragmatic breathing in seated for 5 min-pt struggled to expand abdomen instead of chest during inhalation.   (  cued pt to take 2 deep breaths after each set of exercises and it seemed to really help his performance)    07/06/2024: Nustep level 4 x6 min with PT present to discuss status and monitor Standing hamstring stretch on stairs 2x20 sec bilat Standing hip flexor stretch with one foot on second step and lateral overhead reach x10 bilat Seated rows on Matrix:  55# 2x10 Standing paloff press with blue tband 2x10 bilat Seated on blue pball:  4D pelvic tilts x20 each, CW/CCW pelvic rotations x20 each Seated on blue pball: marching and LAQ 2x10 each bilat (cuing for core engagement and posture) Standing performing overhead press with 5# dumbbells with alt LE marching 2x10 Standing Farmer's carry with  unilateral 5# dumbbell carry to cancer gym, then back with changing hands on the way back Standing on foam pad performing around the worlds with 5# dumbbell x10 bilat Standing balance on wobble board x1 min 3 way standing L counter stretch x10 sec each   06/23/2024: Nustep level 6 x6 min with PT present to discuss status and monitor Seated hamstring stretch 2x20 sec bilat Seated figure 4 piriformis stretch 2x20 sec bilat Seated quad sets 1x10 bilat Leg press (seat at 9):  90# 2x15 Standing rows 10# cable pulley 2x15 (cuing for improved posture, breathing, and technique) Standing paloff press with 10# cable pulley 2x10 bilat Supine posterior pelvic tilt 2x10 Standing shoulder horizontal abduction with red tband x5    PATIENT EDUCATION:  Education details: Access Code: [email or web address] Person educated: Patient Education method: Explanation, Demonstration, and Handouts Education comprehension: verbalized understanding and returned demonstration  HOME EXERCISE PROGRAM: Access Code: [email or web address] URL: [email or web address] Date: 06/23/2024 Prepared by: Jarrell Menke  Exercises - Supine Lower Trunk Rotation  - 3 x daily - 7 x weekly - 1 sets - 3 reps - 20 hold - Hooklying Single Knee to Chest  - 3 x daily - 7 x weekly - 1 sets - 3 reps - 20 hold - Seated Hamstring Stretch  - 3 x daily - 7 x weekly - 1 sets - 3 reps - 20 hold - Seated Figure 4 Piriformis Stretch  - 3 x daily - 7 x weekly - 1 sets - 1 reps - 20 hold - Supine Posterior Pelvic Tilt  - 1 x daily - 7 x weekly - 2 sets - 10 reps - Standing Shoulder Horizontal Abduction with Resistance  - 1 x daily - 7 x weekly - 2 sets - 10 reps  ASSESSMENT:  CLINICAL IMPRESSION:  Billy Andersen presents to therapy reporting significantly decreased pain in left knee, and very little low back pain. Pt arrived late due to unexpected school traffic with dropping off his kids. Pt was introduced to diaphragmatic breathing, requiring several  verbal and tactile cues to expand abdomen during inhalation instead of expanding chest; plan to practice this next session as it will also serve pt's overall health based on past med hx of heart occurrences. Pt improved body mechanics after neuromuscular reeducation with instruction to disengage his back muscles, and activate his core/abdominals. Tactile cues help greatly to facilitate whether pt is using the proposed muscle during exercises. Patient continues to requires skilled PT to progress towards goal related activities.   OBJECTIVE IMPAIRMENTS: decreased endurance, decreased mobility, difficulty walking, hypomobility, increased muscle spasms, improper body mechanics, postural dysfunction, and pain.   ACTIVITY LIMITATIONS: lifting, sitting, standing, squatting, and caring for others  PARTICIPATION LIMITATIONS: meal prep, laundry, community activity, occupation, and yard work  PERSONAL FACTORS:  1 comorbidity: LBP, MI 9 months ago are also affecting patient's functional outcome.   REHAB POTENTIAL: Good  CLINICAL DECISION MAKING: Stable/uncomplicated  EVALUATION COMPLEXITY: Low   GOALS: Goals reviewed with patient? Yes  SHORT TERM GOALS: Target date: 07/13/2024    Be independent in initial HEP Baseline: Goal status: Met on 07/06/2024  2.  Improve Modified Oswestry to < or = to 28% disability Baseline: 36% (18/50) Goal status: INITIAL  3.  Initiate return to regular cardio and core strength routine and verbalize understanding of how to safely progress Baseline:  Goal status: Ongoing  4.  Report < or = to 2/10 Rt sided LBP with sitting and driving for work  Baseline: 3-4/10 Goal status: Ongoing  5.  Verbalize and demonstrate body mechanics modifications for lumbar protection with daily tasks  Baseline:  Goal status: Progressing     LONG TERM GOALS: Target date: 08/10/2024   Be independent in advanced HEP Baseline:  Goal status: Progressing   2.  Improve Modified  Oswestry to < or = to 20% disability  Baseline: 18/50=36% Goal status: INITIAL  3.  Report < or = to 1/10 Rt sided LBP with sitting and driving for work  Baseline: 4/10 Goal status: Progressing   4.  Return to regular exercise routine without limitation due to LBP Baseline: not doing regular exercise  Goal status: INITIAL   PLAN:  PT FREQUENCY: 2x/week  PT DURATION: 8 weeks  PLANNED INTERVENTIONS: 97110-Therapeutic exercises, 97530- Therapeutic activity, 97112- Neuromuscular re-education, 97535- Self Care, [id number]- Manual therapy, [phone]- Canalith repositioning, [id number]- Aquatic Therapy, [id number]- Electrical stimulation (unattended), [phone]- Electrical stimulation (manual), 20560 (1-2 muscles), 20561 (3+ muscles)- Dry Needling, Patient/Family education, Balance training, Stair training, Taping, Joint mobilization, Spinal manipulation, and Spinal mobilization.  PLAN FOR NEXT SESSION: Address all short-term goals, Oswestry, continue postural strengthening  Lavanda Cleverly, SPT 07/20/24 10:16 AM   I agree with the following treatment note after reviewing documentation. This session was performed under the supervision of a licensed clinician.  Jarrell Laming, PT, DPT 07/20/24, 10:16 AM  [facility] [address] [place], [place] [address] Phone # [phone] Fax [phone]

## 2024-07-22 ENCOUNTER — Encounter: Payer: Self-pay | Admitting: Rehabilitative and Restorative Service Providers"

## 2024-07-22 ENCOUNTER — Ambulatory Visit: Admitting: Rehabilitative and Restorative Service Providers"

## 2024-07-22 DIAGNOSIS — M25652 Stiffness of left hip, not elsewhere classified: Secondary | ICD-10-CM

## 2024-07-22 DIAGNOSIS — R252 Cramp and spasm: Secondary | ICD-10-CM

## 2024-07-22 DIAGNOSIS — M5459 Other low back pain: Secondary | ICD-10-CM

## 2024-07-22 DIAGNOSIS — M25651 Stiffness of right hip, not elsewhere classified: Secondary | ICD-10-CM

## 2024-07-22 NOTE — Therapy (Signed)
 OUTPATIENT PHYSICAL THERAPY TREATMENT NOTE   Patient Name: Billy Andersen prefers being called Billy [facility] MRN: [id number] DOB:December 02, 1975, 48 y.o., male Today's Date: 07/22/2024  END OF SESSION:  PT End of Session - 07/22/24 1121     Visit Number 5    Date for PT Re-Evaluation 08/10/24    Authorization Type [employer]-auth requested    PT Start Time 0930    PT Stop Time 1017    PT Time Calculation (min) 47 min    Activity Tolerance Patient tolerated treatment well    Behavior During Therapy [facility] for tasks assessed/performed            Past Medical History:  Diagnosis Date   ADHD    Anxiety    Aortic dilatation (HCC)    Ascending, 43 mm, Dr. 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  Iron normal   Ruptured plantar fascia    left, 2022   Past Surgical History:  Procedure Laterality Date   CORONARY STENT INTERVENTION N/A 09/14/2023   Procedure: CORONARY STENT INTERVENTION;  Surgeon: Billy Heinz, MD;  Location: [facility];  Service: Cardiovascular;  Laterality: N/A;   EXTERNAL EAR SURGERY Right    trauma (bit off)   LEFT HEART CATH AND CORONARY ANGIOGRAPHY N/A 09/14/2023   Procedure: LEFT HEART CATH AND CORONARY ANGIOGRAPHY;  Surgeon: Billy Heinz, MD;  Location: [facility];  Service: Cardiovascular;  Laterality: N/A;   LEFT HEART CATH AND CORONARY ANGIOGRAPHY N/A 09/22/2023   Procedure: LEFT HEART CATH AND CORONARY ANGIOGRAPHY;  Surgeon:  Billy [name], MD;  Location: [facility];  Service: Cardiovascular;  Laterality: N/A;   Rhythm monitoring     12/2023 multiple episodes of SVT--> Billy [facility] started metoprolol    TRANSTHORACIC ECHOCARDIOGRAM     01/2024 nl except aortic dilat 43 mm.  05/2024 same.   Patient Active Problem List   Diagnosis Date Noted   Back pain 06/09/2024   ADHD 06/08/2024   Bipolar disorder (HCC) 06/08/2024   Acute back pain 06/07/2024   History of multiple concussions 04/03/2024   Cognitive and behavioral changes 04/03/2024   Ascending aorta dilation (HCC) 10/02/2023   Pure hypercholesterolemia 09/22/2023   Chest pain 09/21/2023   Dyspnea 09/21/2023   Non-ST elevation (NSTEMI) myocardial infarction [facility]) 09/15/2023   CAD (coronary artery disease) 09/15/2023    PCP:  Billy Aleene DEL, MD   REFERRING PROVIDER: [name], MD   REFERRING DIAG: M54.42 (ICD-10-CM) - Acute midline low back pain with left-sided sciatica   Rationale for Evaluation and Treatment: Rehabilitation  THERAPY DIAG:  Stiffness of left hip, not elsewhere classified  Other low back pain  Cramp and spasm  Stiffness of right hip, not elsewhere classified  ONSET DATE: 2 weeks ago  SUBJECTIVE:  SUBJECTIVE STATEMENT: Pt reports soreness in the back the last few days. End of every day 3/10 pain in the back. Pt states he does his HEP, the deadbug on his yoga mat on the floor at home 3x15. He also seated crunches and regular pushups.   PERTINENT HISTORY:  MI 09/2023 with stent placement.   PAIN:  07/22/2024 Are you having pain? Yes: NPRS scale: 1/10 just stiffness today Pain location: Rt low back into Rt buttock at time Pain description: sore, was intense at times  Aggravating factors: bending away, twisting, sitting,  standing, walking long periods  Relieving factors: change position, rest   PRECAUTIONS: None  RED FLAGS: None   WEIGHT BEARING RESTRICTIONS: No  FALLS:  Has patient fallen in last 6 months? No  LIVING ENVIRONMENT: Lives with: lives with their family Lives in: House/apartment Stairs: Yes: External: 3 steps; on right going up   OCCUPATION: broker for accident/injury-desk work with travel   PLOF: Independent, Vocation/Vocational requirements: desk work, driving, and Leisure: play with kids  PATIENT GOALS: reduce pain, improve core strength   NEXT MD VISIT: PCP 06/16/24  OBJECTIVE:  Note: Objective measures were completed at Evaluation unless otherwise noted.  DIAGNOSTIC FINDINGS:  MRI 06/08/24:  IMPRESSION: 1. Lumbar spondylosis as outlined within the body of the report. 2. At L4-L5, there is 2 mm grade 1 anterolisthesis. Moderate facet arthropathy with ligamentum flavum hypertrophy. Small bilateral facet joint effusions. Small bilateral posteriorly projecting synovial facet cyst. Moderate right subarticular stenosis with potential to affect the descending right L5 nerve root. Mild left subarticular stenosis. Mild left neural foraminal narrowing. 3. At L5-S1, there is mild disc degeneration. Disc bulge. No significant spinal canal stenosis. Mild left neural foraminal narrowing. 4. No significant disc herniation, spinal canal stenosis or neural foraminal narrowing at the remaining lumbar levels. 5. Marrow edema and enhancement within the bilateral L5 pars interarticulari/pedicles. This may be degenerative and related to facet arthropathy, or may reflect stress reaction.    PATIENT SURVEYS:  06/15/24: Modified Oswestry: 18/50=36% disability    COGNITION: Overall cognitive status: Within functional limits for tasks assessed     SENSATION: WFL  MUSCLE LENGTH: Hip flexibility limited by 50% bil   POSTURE: rounded shoulders and flexed trunk   PALPATION: Reduced segmental  mobility in the lumbar spine without pain, tension in Rt lumbar paraspinals with minimal pain L4/5  LUMBAR ROM:   Limited by 50% in all directions with reduced segmental mobility.  Pain with Lt sidebending over Rt lumbar spine   LOWER EXTREMITY MMT:   4+/5 bil LE strength   LUMBAR SPECIAL TESTS:  Straight leg raise test: Negative and Slump test: Negative  GAIT: Distance walked: 50 ft Assistive device utilized: None Level of assistance: Complete Independence Comments: WNL  TREATMENT DATE:   07/22/24 Nustep level 5 x6 min with PT present to discuss status and  Seated piriformis stretch 2x20 sec  Standing hamstring stretch on stairs 2x20 sec bilat Leg press (seat 6) 110# 2x10 Abduction  Matrix machine 55# 2x10 Sit to stands with 10# x10, another set of x5 Serratus punches in supine 5# 2x10 each side Woodchop Diagonals on Matrix machine 10# 2x10-for trunk stability  Reverse chop Diagonals on Matrix machine 10# 2x10-trunk stability  Balance- side kick at barre 2x10 needing UE support, side kick at barre x10 on foam needing UE support Diaphragmatic breathing in seated for 3 min-pt struggled to expand abdomen instead of chest during inhalation.    07/20/24  Pt arrived late stating  traffic Nustep level 4 x6 min with PT present to discuss status and  Seated piriformis stretch 2x20 sec  Standing hamstring stretch on stairs 2x20 sec bilat Seated rows on Matrix:  55# 2x10 Dynadisc in seated reverse crunch 3x8 Standing paloff press with blue tband 2x10 bilat Seated on dynadisc: marching  3x15 (cuing for core engagement and breathing technique) Diaphragmatic breathing in seated for 5 min-pt struggled to expand abdomen instead of chest during inhalation.   (cued pt to take 2 deep breaths after each set of exercises and it seemed to really help his performance)    07/06/2024: Nustep level 4 x6 min with PT present to discuss status and monitor Standing hamstring stretch on stairs 2x20  sec bilat Standing hip flexor stretch with one foot on second step and lateral overhead reach x10 bilat Seated rows on Matrix:  55# 2x10 Standing paloff press with blue tband 2x10 bilat Seated on blue pball:  4D pelvic tilts x20 each, CW/CCW pelvic rotations x20 each Seated on blue pball: marching and LAQ 2x10 each bilat (cuing for core engagement and posture) Standing performing overhead press with 5# dumbbells with alt LE marching 2x10 Standing Farmer's carry with unilateral 5# dumbbell carry to cancer gym, then back with changing hands on the way back Standing on foam pad performing around the worlds with 5# dumbbell x10 bilat Standing balance on wobble board x1 min 3 way standing L counter stretch x10 sec each    PATIENT EDUCATION:  Education details: Access Code: [email or web address] Person educated: Patient Education method: Explanation, Demonstration, and Handouts Education comprehension: verbalized understanding and returned demonstration  HOME EXERCISE PROGRAM: Access Code: [email or web address] URL: [email or web address] Date: 06/23/2024 Prepared by: Jarrell Menke  Exercises - Supine Lower Trunk Rotation  - 3 x daily - 7 x weekly - 1 sets - 3 reps - 20 hold - Hooklying Single Knee to Chest  - 3 x daily - 7 x weekly - 1 sets - 3 reps - 20 hold - Seated Hamstring Stretch  - 3 x daily - 7 x weekly - 1 sets - 3 reps - 20 hold - Seated Figure 4 Piriformis Stretch  - 3 x daily - 7 x weekly - 1 sets - 1 reps - 20 hold - Supine Posterior Pelvic Tilt  - 1 x daily - 7 x weekly - 2 sets - 10 reps - Standing Shoulder Horizontal Abduction with Resistance  - 1 x daily - 7 x weekly - 2 sets - 10 reps  ASSESSMENT:  CLINICAL IMPRESSION:  Mr Ciampi presents to therapy with mild back stiffness and improved back pain since starting PT. Today's session focused on LE strengthening that transitioned into functional training. Sit to stands with weight, pt does better with 2 sets of reps instead of  doing 10 reps straight. He stated by end of doing first 10, his lower back started to hurt- most likely due to fatigue. Session ended with diagonal pattern motions to promote trunk stability and single limb exercises to promote improved balance. Plan to spend majority of next visit on balance exercises, as pt presented with decreased proprioceptive awareness, and also requested to work on balance. Patient continues to require skilled PT to progress towards goal related activities.   OBJECTIVE IMPAIRMENTS: decreased endurance, decreased mobility, difficulty walking, hypomobility, increased muscle spasms, improper body mechanics, postural dysfunction, and pain.   ACTIVITY LIMITATIONS: lifting, sitting, standing, squatting, and caring for others  PARTICIPATION LIMITATIONS: meal prep, laundry, community activity, occupation, and yard work  PERSONAL FACTORS: 1 comorbidity: LBP, MI 9 months ago are also affecting patient's functional outcome.   REHAB POTENTIAL: Good  CLINICAL DECISION MAKING: Stable/uncomplicated  EVALUATION COMPLEXITY: Low   GOALS: Goals reviewed with patient? Yes  SHORT TERM GOALS: Target date: 07/13/2024    Be independent in initial HEP Baseline: Goal status: Met on 07/06/2024  2.  Improve Modified Oswestry to < or = to 28% disability Baseline: 36% (18/50) Goal status: INITIAL  3.  Initiate return to regular cardio and core strength routine and verbalize understanding of how to safely progress Baseline:  Goal status: Ongoing  4.  Report < or = to 2/10 Rt sided LBP with sitting and driving for work  Baseline: 3-4/10 Goal status: Ongoing  5.  Verbalize and demonstrate body mechanics modifications for lumbar protection with daily tasks  Baseline:  Goal status: Progressing     LONG TERM GOALS: Target date: 08/10/2024   Be independent in advanced HEP Baseline:  Goal status: Progressing   2.  Improve Modified Oswestry to < or = to 20% disability  Baseline:  18/50=36% Goal status: INITIAL  3.  Report < or = to 1/10 Rt sided LBP with sitting and driving for work  Baseline: 4/10 Goal status: Progressing   4.  Return to regular exercise routine without limitation due to LBP Baseline: not doing regular exercise  Goal status: Progressing    PLAN:  PT FREQUENCY: 2x/week  PT DURATION: 8 weeks  PLANNED INTERVENTIONS: 97110-Therapeutic exercises, 97530- Therapeutic activity, 97112- Neuromuscular re-education, 97535- Self Care, [id number]- Manual therapy, [phone]- Canalith repositioning, [id number]- Aquatic Therapy, [id number]- Electrical stimulation (unattended), [phone]- Electrical stimulation (manual), 20560 (1-2 muscles), [id number] (3+ muscles)- Dry Needling, Patient/Family education, Balance training, Stair training, Taping, Joint mobilization, Spinal manipulation, and Spinal mobilization.  PLAN FOR NEXT SESSION: Address all short-term goals, Oswestry, continue postural strengthening,focus on balancing exercises for next visit  Lavanda Cleverly, SPT 07/22/24 11:40 AM   I agree with the following treatment note after reviewing documentation. This session was performed under the supervision of a licensed clinician.  Jarrell Laming, PT, DPT 07/22/24, 11:40 AM  [facility] [address], [address] [place], [place] [address] Phone # [phone] Fax [phone]

## 2024-07-26 ENCOUNTER — Encounter: Payer: Self-pay | Admitting: Rehabilitative and Restorative Service Providers"

## 2024-07-26 ENCOUNTER — Ambulatory Visit: Admitting: Rehabilitative and Restorative Service Providers"

## 2024-07-26 DIAGNOSIS — M5459 Other low back pain: Secondary | ICD-10-CM | POA: Diagnosis not present

## 2024-07-26 DIAGNOSIS — R252 Cramp and spasm: Secondary | ICD-10-CM

## 2024-07-26 DIAGNOSIS — M25651 Stiffness of right hip, not elsewhere classified: Secondary | ICD-10-CM

## 2024-07-26 DIAGNOSIS — M25652 Stiffness of left hip, not elsewhere classified: Secondary | ICD-10-CM

## 2024-07-26 NOTE — Therapy (Signed)
 OUTPATIENT PHYSICAL THERAPY TREATMENT NOTE   Patient Name: Billy Andersen prefers being called Billy Andersen MRN: [id number] DOB:May 08, 1976, 48 y.o., male Today's Date: 07/26/2024  END OF SESSION:  PT End of Session - 07/26/24 1124     Visit Number 6    Date for PT Re-Evaluation 08/10/24    Authorization Type [employer]-auth requested    PT Start Time 0928    PT Stop Time 1015    PT Time Calculation (min) 47 min    Activity Tolerance Patient tolerated treatment well    Behavior During Therapy [facility] for tasks assessed/performed             Past Medical History:  Diagnosis Date   ADHD    Anxiety    Aortic dilatation (HCC)    Ascending, 43 mm, Dr. 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  Iron normal   Ruptured plantar fascia    left, 2022   Past Surgical History:  Procedure Laterality Date   CORONARY STENT INTERVENTION N/A 09/14/2023   Procedure: CORONARY STENT INTERVENTION;  Surgeon: Ladona Heinz, MD;  Location: [facility] INVASIVE CV LAB;  Service: Cardiovascular;  Laterality: N/A;   EXTERNAL EAR SURGERY Right    trauma (bit off)   LEFT HEART CATH AND CORONARY ANGIOGRAPHY N/A 09/14/2023   Procedure: LEFT HEART CATH AND CORONARY ANGIOGRAPHY;  Surgeon: Ladona Heinz, MD;  Location: [facility] INVASIVE CV LAB;  Service: Cardiovascular;  Laterality: N/A;   LEFT HEART CATH AND CORONARY ANGIOGRAPHY N/A 09/22/2023   Procedure: LEFT HEART CATH AND CORONARY ANGIOGRAPHY;  Surgeon:  [name], MD;  Location: [facility] INVASIVE CV LAB;  Service: Cardiovascular;  Laterality: N/A;   Rhythm monitoring     12/2023 multiple episodes of SVT--> DrRONITA Alveta started metoprolol    TRANSTHORACIC ECHOCARDIOGRAM     01/2024 nl except aortic dilat 43 mm.  05/2024 same.   Patient Active Problem List   Diagnosis Date Noted   Back pain 06/09/2024   ADHD 06/08/2024   Bipolar disorder (HCC) 06/08/2024   Acute back pain 06/07/2024   History of multiple concussions 04/03/2024   Cognitive and behavioral changes 04/03/2024   Ascending aorta dilation (HCC) 10/02/2023   Pure hypercholesterolemia 09/22/2023   Chest pain 09/21/2023   Dyspnea 09/21/2023   Non-ST elevation (NSTEMI) myocardial infarction [facility]) 09/15/2023   CAD (coronary artery disease) 09/15/2023    PCP:  [name], MD   REFERRING PROVIDER: [name], MD   REFERRING DIAG: M54.42 (ICD-10-CM) - Acute midline low back pain with left-sided sciatica   Rationale for Evaluation and Treatment: Rehabilitation  THERAPY DIAG:  Stiffness of left hip, not elsewhere classified  Stiffness of right hip, not elsewhere classified  Other low back pain  Cramp and spasm  ONSET DATE: 2 weeks ago  SUBJECTIVE:  SUBJECTIVE STATEMENT: Pt reports two days ago, while sitting for about 30 min stood up and had low back pain 6-7/10 described as sore pain. He took a few steps and it disappeared. Today he reports no low back pain and is ready for PT.   PERTINENT HISTORY:  MI 09/2023 with stent placement.   PAIN:  07/26/2024 Are you having pain? Yes: NPRS scale: 1/10 just stiffness today Pain location: Rt low back into Rt buttock at time Pain description: sore, was intense at times  Aggravating factors: bending away, twisting, sitting, standing,  walking long periods  Relieving factors: change position, rest   PRECAUTIONS: None  RED FLAGS: None   WEIGHT BEARING RESTRICTIONS: No  FALLS:  Has patient fallen in last 6 months? No  LIVING ENVIRONMENT: Lives with: lives with their family Lives in: House/apartment Stairs: Yes: External: 3 steps; on right going up   OCCUPATION: broker for accident/injury-desk work with travel   PLOF: Independent, Vocation/Vocational requirements: desk work, driving, and Leisure: play with kids  PATIENT GOALS: reduce pain, improve core strength   NEXT MD VISIT: PCP 06/16/24  OBJECTIVE:  Note: Objective measures were completed at Evaluation unless otherwise noted.  DIAGNOSTIC FINDINGS:  MRI 06/08/24:  IMPRESSION: 1. Lumbar spondylosis as outlined within the body of the report. 2. At L4-L5, there is 2 mm grade 1 anterolisthesis. Moderate facet arthropathy with ligamentum flavum hypertrophy. Small bilateral facet joint effusions. Small bilateral posteriorly projecting synovial facet cyst. Moderate right subarticular stenosis with potential to affect the descending right L5 nerve root. Mild left subarticular stenosis. Mild left neural foraminal narrowing. 3. At L5-S1, there is mild disc degeneration. Disc bulge. No significant spinal canal stenosis. Mild left neural foraminal narrowing. 4. No significant disc herniation, spinal canal stenosis or neural foraminal narrowing at the remaining lumbar levels. 5. Marrow edema and enhancement within the bilateral L5 pars interarticulari/pedicles. This may be degenerative and related to facet arthropathy, or may reflect stress reaction.    PATIENT SURVEYS:   06/15/24: Modified Oswestry: 18/50=36% disability   07/26/24: Modified Oswestry: 0/50 = 0% Disability   COGNITION: Overall cognitive status: Within functional limits for tasks assessed     SENSATION: WFL  MUSCLE LENGTH: Hip flexibility limited by 50% bil   POSTURE: rounded shoulders and  flexed trunk   PALPATION: Reduced segmental mobility in the lumbar spine without pain, tension in Rt lumbar paraspinals with minimal pain L4/5  LUMBAR ROM:   Limited by 50% in all directions with reduced segmental mobility.  Pain with Lt sidebending over Rt lumbar spine   LOWER EXTREMITY MMT:   4+/5 bil LE strength   LUMBAR SPECIAL TESTS:  Straight leg raise test: Negative and Slump test: Negative  GAIT: Distance walked: 50 ft Assistive device utilized: None Level of assistance: Complete Independence Comments: WNL  TREATMENT DATE:   07/26/24 Nustep level 5 x5 min with PT student present to discuss status Oswestry 0% Goals reassessment Leg press seat 6 on 125# 3x10 Standing abdominal bracing 3x8 Single limb balance one leg on wall alternating 10# dumbell 3x10 on each leg-verbal cues to breathe and to engage core  Single limb heel raise from floor with other foot on 6 in step, holding 8# weights bil hands 3x8 Patient education- proper body mechanics for doing dead lift for picking up objects off floor vs doing a squat Foam rolling education/practice for bil gastroc x30 sec each leg Airex tandem 3 laps (down and back)  Airex lateral side stepping 3 laps Single limb  stance 2x 30sec each side-verbal cues for mind to muscle connection within foot receptors to challenge proprioception    07/22/24 Nustep level 5 x6 min with PT present to discuss status and recent occurrences Leg press (seat 6) 110# 2x10 Abduction  Matrix machine 55# 2x10 Sit to stands with 10# x10, another set of x5 Serratus punches in supine 5# 2x10 each side Woodchop Diagonals on Matrix machine 10# 2x10-for trunk stability  Reverse chop Diagonals on Matrix machine 10# 2x10-trunk stability  Balance- side kick at barre 2x10 needing UE support, side kick at barre x10 on foam needing UE support Diaphragmatic breathing in seated for 3 min-pt struggled to expand abdomen instead of chest during inhalation.     07/20/24  Pt arrived late stating traffic Nustep level 4 x6 min with PT present to discuss status and  Seated piriformis stretch 2x20 sec  Standing hamstring stretch on stairs 2x20 sec bilat Seated rows on Matrix:  55# 2x10 Dynadisc in seated reverse crunch 3x8 Standing paloff press with blue tband 2x10 bilat Seated on dynadisc: marching  3x15 (cuing for core engagement and breathing technique) Diaphragmatic breathing in seated for 5 min-pt struggled to expand abdomen instead of chest during inhalation.   (cued pt to take 2 deep breaths after each set of exercises and it seemed to really help his performance)   PATIENT EDUCATION:  Education details: Access Code: [email or web address] Person educated: Patient Education method: Explanation, Demonstration, and Handouts Education comprehension: verbalized understanding and returned demonstration  HOME EXERCISE PROGRAM: Access Code: [email or web address] URL: [email or web address] Date: 06/23/2024 Prepared by: Jarrell Menke  Exercises - Supine Lower Trunk Rotation  - 3 x daily - 7 x weekly - 1 sets - 3 reps - 20 hold - Hooklying Single Knee to Chest  - 3 x daily - 7 x weekly - 1 sets - 3 reps - 20 hold - Seated Hamstring Stretch  - 3 x daily - 7 x weekly - 1 sets - 3 reps - 20 hold - Seated Figure 4 Piriformis Stretch  - 3 x daily - 7 x weekly - 1 sets - 1 reps - 20 hold - Supine Posterior Pelvic Tilt  - 1 x daily - 7 x weekly - 2 sets - 10 reps - Standing Shoulder Horizontal Abduction with Resistance  - 1 x daily - 7 x weekly - 2 sets - 10 reps  ASSESSMENT:  CLINICAL IMPRESSION:    Billy Andersen presents to skilled therapy with mild complaints of low back pain that occurred for less than 20 min 6/10 pain, after standing up from sitting for 30 min. After discussing,  pain is most likely due to poor body mechanics -esp in the posterior trunk region -during yardworking pt did the day before. Pt admits he most likely wasn't thinking about his  movements, thus not making the mind to muscle connection necessary for safe form/technique during motion. Today's focus was on balancing, proprioceptive challenging exercises particularly with single limb activities. Pt tolerated exercises well, finding them to be an appropriate challenge. He required cues throughout session to embrace abdomen during activity. Pt demonstrates moderate difficulty with standing abduction kick outs, thus plan to practice next visit, along with proper form for dead lifts and squats. Pt continues to require skilled PT to progress towards goal related activities.   OBJECTIVE IMPAIRMENTS: decreased endurance, decreased mobility, difficulty walking, hypomobility, increased muscle spasms, improper body mechanics, postural dysfunction, and pain.   ACTIVITY LIMITATIONS: lifting, sitting, standing, squatting, and caring for others  PARTICIPATION  LIMITATIONS: meal prep, laundry, community activity, occupation, and yard work  PERSONAL FACTORS: 1 comorbidity: LBP, MI 9 months ago are also affecting patient's functional outcome.   REHAB POTENTIAL: Good  CLINICAL DECISION MAKING: Stable/uncomplicated  EVALUATION COMPLEXITY: Low   GOALS: Goals reviewed with patient? Yes  SHORT TERM GOALS: Target date: 07/13/2024    Be independent in initial HEP Baseline: Goal status: Met on 07/06/2024  2.  Improve Modified Oswestry to < or = to 28% disability Baseline: 36% (18/50) Goal status: MET on 07/26/24 (0%)  3.  Initiate return to regular cardio and core strength routine and verbalize understanding of how to safely progress Baseline:  Goal status: Ongoing  4.  Report < or = to 2/10 Rt sided LBP with sitting and driving for work  Baseline: 3-4/10 Goal status: Met on 07/26/24  5.  Verbalize and demonstrate body mechanics modifications for lumbar protection with daily tasks  Baseline:  Goal status: Progressing (still requires cuing on 07/26/24)    LONG TERM GOALS: Target  date: 08/10/2024   Be independent in advanced HEP Baseline:  Goal status: Progressing   2.  Improve Modified Oswestry to < or = to 20% disability  Baseline: 18/50=36% Goal status: MET on 07/26/24 (0%)  3.  Report < or = to 1/10 Rt sided LBP with sitting and driving for work  Baseline: 4/10 Goal status: Progressing (reports pain of 1-2/10 max with activities)  4.  Return to regular exercise routine without limitation due to LBP Baseline: not doing regular exercise  Goal status: Progressing    PLAN:  PT FREQUENCY: 2x/week  PT DURATION: 8 weeks  PLANNED INTERVENTIONS: 97110-Therapeutic exercises, 97530- Therapeutic activity, 97112- Neuromuscular re-education, 97535- Self Care, [id number]- Manual therapy, [phone]- Canalith repositioning, [id number]- Aquatic Therapy, [id number]- Electrical stimulation (unattended), [phone]- Electrical stimulation (manual), 20560 (1-2 muscles), [id number] (3+ muscles)- Dry Needling, Patient/Family education, Balance training, Stair training, Taping, Joint mobilization, Spinal manipulation, and Spinal mobilization.  PLAN FOR NEXT SESSION: Update HEP, continue postural strengthening, focus on balancing exercises for next visit, single limb stance, standing leg abd, dead lifts, squats  Lavanda Cleverly, SPT 07/26/24 11:51 AM   I agree with the following treatment note after reviewing documentation. This session was performed under the supervision of a licensed clinician.  Jarrell Laming, PT, DPT 07/26/24, 11:51 AM  [facility] [address], [address] [place], [place] [address] Phone # [phone] Fax [phone]

## 2024-07-28 ENCOUNTER — Ambulatory Visit: Admitting: Rehabilitative and Restorative Service Providers"

## 2024-07-28 ENCOUNTER — Encounter: Payer: Self-pay | Admitting: Rehabilitative and Restorative Service Providers"

## 2024-07-28 DIAGNOSIS — M5459 Other low back pain: Secondary | ICD-10-CM | POA: Diagnosis not present

## 2024-07-28 DIAGNOSIS — M25651 Stiffness of right hip, not elsewhere classified: Secondary | ICD-10-CM

## 2024-07-28 DIAGNOSIS — M25652 Stiffness of left hip, not elsewhere classified: Secondary | ICD-10-CM

## 2024-07-28 DIAGNOSIS — R252 Cramp and spasm: Secondary | ICD-10-CM

## 2024-07-28 NOTE — Therapy (Signed)
 OUTPATIENT PHYSICAL THERAPY TREATMENT NOTE   Patient Name: Billy Andersen prefers being called Billy Andersen MRN: [id number] DOB:[birth year], 48 y.o., male Today's Date: 07/28/2024  END OF SESSION:  PT End of Session - 07/28/24 0934     Visit Number 7    Date for PT Re-Evaluation 08/10/24    Authorization Type [employer]-auth required at 27 visits    PT Start Time 0932    PT Stop Time 1010    PT Time Calculation (min) 38 min    Activity Tolerance Patient tolerated treatment well    Behavior During Therapy [facility] for tasks assessed/performed             Past Medical History:  Diagnosis Date   ADHD    Anxiety    Aortic dilatation (HCC)    Ascending, 43 mm, Dr. 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  Iron normal   Ruptured plantar fascia    left, 2022   Past Surgical History:  Procedure Laterality Date   CORONARY STENT INTERVENTION N/A 09/14/2023   Procedure: CORONARY STENT INTERVENTION;  Surgeon: Ladona Heinz, MD;  Location: [facility] INVASIVE CV LAB;  Service: Cardiovascular;  Laterality: N/A;   EXTERNAL EAR SURGERY Right    trauma (bit off)   LEFT HEART CATH AND CORONARY ANGIOGRAPHY N/A 09/14/2023   Procedure: LEFT HEART CATH AND CORONARY ANGIOGRAPHY;  Surgeon: Ladona Heinz, MD;  Location: [facility] INVASIVE CV LAB;  Service: Cardiovascular;  Laterality: N/A;   LEFT HEART CATH AND CORONARY ANGIOGRAPHY N/A 09/22/2023   Procedure: LEFT HEART CATH AND CORONARY ANGIOGRAPHY;   Surgeon: Billy [name], MD;  Location: [facility] INVASIVE CV LAB;  Service: Cardiovascular;  Laterality: N/A;   Rhythm monitoring     12/2023 multiple episodes of SVT--> DrRONITA Alveta started metoprolol    TRANSTHORACIC ECHOCARDIOGRAM     01/2024 nl except aortic dilat 43 mm.  05/2024 same.   Patient Active Problem List   Diagnosis Date Noted   Back pain 06/09/2024   ADHD 06/08/2024   Bipolar disorder (HCC) 06/08/2024   Acute back pain 06/07/2024   History of multiple concussions 04/03/2024   Cognitive and behavioral changes 04/03/2024   Ascending aorta dilation (HCC) 10/02/2023   Pure hypercholesterolemia 09/22/2023   Chest pain 09/21/2023   Dyspnea 09/21/2023   Non-ST elevation (NSTEMI) myocardial infarction [facility]) 09/15/2023   CAD (coronary artery disease) 09/15/2023    PCP:  [name], MD   REFERRING PROVIDER: [name], MD   REFERRING DIAG: M54.42 (ICD-10-CM) - Acute midline low back pain with left-sided sciatica   Rationale for Evaluation and Treatment: Rehabilitation  THERAPY DIAG:  Stiffness of left hip, not elsewhere classified  Stiffness of right hip, not elsewhere classified  Other low back pain  Cramp and spasm  ONSET DATE: 2 weeks ago  SUBJECTIVE:  SUBJECTIVE STATEMENT: Pt reports that did some weed eating last night and had some pain of 7/10 last night due to that.  However, he is feeling better this morning with pain of 2/10  PERTINENT HISTORY:  MI 09/2023 with stent placement.   PAIN:  Are you having pain? Yes: NPRS scale: 2/10 just stiffness today Pain location: Rt low back into Rt buttock at time Pain description: sore, was intense at times  Aggravating factors: bending away, twisting, sitting, standing, walking long periods  Relieving factors: change position,  rest   PRECAUTIONS: None  RED FLAGS: None   WEIGHT BEARING RESTRICTIONS: No  FALLS:  Has patient fallen in last 6 months? No  LIVING ENVIRONMENT: Lives with: lives with their family Lives in: House/apartment Stairs: Yes: External: 3 steps; on right going up   OCCUPATION: broker for accident/injury-desk work with travel   PLOF: Independent, Vocation/Vocational requirements: desk work, driving, and Leisure: play with kids  PATIENT GOALS: reduce pain, improve core strength   NEXT MD VISIT: PCP 06/16/24  OBJECTIVE:  Note: Objective measures were completed at Evaluation unless otherwise noted.  DIAGNOSTIC FINDINGS:  MRI 06/08/24:  IMPRESSION: 1. Lumbar spondylosis as outlined within the body of the report. 2. At L4-L5, there is 2 mm grade 1 anterolisthesis. Moderate facet arthropathy with ligamentum flavum hypertrophy. Small bilateral facet joint effusions. Small bilateral posteriorly projecting synovial facet cyst. Moderate right subarticular stenosis with potential to affect the descending right L5 nerve root. Mild left subarticular stenosis. Mild left neural foraminal narrowing. 3. At L5-S1, there is mild disc degeneration. Disc bulge. No significant spinal canal stenosis. Mild left neural foraminal narrowing. 4. No significant disc herniation, spinal canal stenosis or neural foraminal narrowing at the remaining lumbar levels. 5. Marrow edema and enhancement within the bilateral L5 pars interarticulari/pedicles. This may be degenerative and related to facet arthropathy, or may reflect stress reaction.    PATIENT SURVEYS:   06/15/24: Modified Oswestry: 18/50=36% disability   07/26/24: Modified Oswestry: 0/50 = 0% Disability   COGNITION: Overall cognitive status: Within functional limits for tasks assessed     SENSATION: WFL  MUSCLE LENGTH: Hip flexibility limited by 50% bil   POSTURE: rounded shoulders and flexed trunk   PALPATION: Reduced segmental mobility in the  lumbar spine without pain, tension in Rt lumbar paraspinals with minimal pain L4/5  LUMBAR ROM:   Limited by 50% in all directions with reduced segmental mobility.  Pain with Lt sidebending over Rt lumbar spine   LOWER EXTREMITY MMT:   4+/5 bil LE strength   LUMBAR SPECIAL TESTS:  Straight leg raise test: Negative and Slump test: Negative  GAIT: Distance walked: 50 ft Assistive device utilized: None Level of assistance: Complete Independence Comments: WNL  TREATMENT DATE:   07/28/2024: Seated hamstring stretch 2x20 sec bilat Seated piriformis stretch 2x20 sec bilat Quadruped transversus abdominus contraction 2x10 Quadruped alt LE extension (donkey kicks) x10 bilat Quadruped alt UE/LE extension (bird dog) x10 bilat Supine dying bug 2x10 Supine on soft foam roll performing snow angels 2x10 Supine on soft foam roll performing horizontal shoulder abduction with green tband 2x10 Tandem walking down the PT hallway down and back 4x10 ft Backwards tandem walking in hallway  Rocker board for DF/PF x2 min Standing balance on rocker board in lateral position x1 min with UE support, as needed Recumbent bike level 4 x5 min with PT present to discuss status   07/26/24 Nustep level 5 x5 min with PT student present to discuss status Oswestry 0%  Goals reassessment Leg press seat 6 on 125# 3x10 Standing abdominal bracing 3x8 Single limb balance one leg on wall alternating 10# dumbell 3x10 on each leg-verbal cues to breathe and to engage core  Single limb heel raise from floor with other foot on 6 in step, holding 8# weights bil hands 3x8 Patient education- proper body mechanics for doing dead lift for picking up objects off floor vs doing a squat Foam rolling education/practice for bil gastroc x30 sec each leg Airex tandem 3 laps (down and back)  Airex lateral side stepping 3 laps Single limb stance 2x 30sec each side-verbal cues for mind to muscle connection within foot receptors to  challenge proprioception    07/22/24 Nustep level 5 x6 min with PT present to discuss status and recent occurrences Leg press (seat 6) 110# 2x10 Abduction  Matrix machine 55# 2x10 Sit to stands with 10# x10, another set of x5 Serratus punches in supine 5# 2x10 each side Woodchop Diagonals on Matrix machine 10# 2x10-for trunk stability  Reverse chop Diagonals on Matrix machine 10# 2x10-trunk stability  Balance- side kick at barre 2x10 needing UE support, side kick at barre x10 on foam needing UE support Diaphragmatic breathing in seated for 3 min-pt struggled to expand abdomen instead of chest during inhalation.      PATIENT EDUCATION:  Education details: Access Code: [phone] Person educated: Patient Education method: Explanation, Demonstration, and Handouts Education comprehension: verbalized understanding and returned demonstration  HOME EXERCISE PROGRAM: Access Code: [email or web address] URL: [email or web address] Date: 07/28/2024 Prepared by: Jarrell Per Beagley  Exercises - Seated Hamstring Stretch  - 3 x daily - 7 x weekly - 1 sets - 3 reps - 20 hold - Seated Figure 4 Piriformis Stretch  - 3 x daily - 7 x weekly - 1 sets - 1 reps - 20 hold - Standing Shoulder Horizontal Abduction with Resistance  - 1 x daily - 7 x weekly - 2 sets - 10 reps - Supine Lower Trunk Rotation  - 3 x daily - 7 x weekly - 1 sets - 3 reps - 20 hold - Hooklying Single Knee to Chest  - 3 x daily - 7 x weekly - 1 sets - 3 reps - 20 hold - Supine Posterior Pelvic Tilt  - 1 x daily - 7 x weekly - 2 sets - 10 reps - Supine Dead Bug with Leg Extension  - 1 x daily - 7 x weekly - 2 sets - 10 reps - Quadruped Transversus Abdominis Bracing  - 1 x daily - 7 x weekly - 2 sets - 10 reps - Bird Dog  - 1 x daily - 7 x weekly - 2 sets - 10 reps - Tandem Walking  - 1 x daily - 7 x weekly - 2 sets - 10 reps  ASSESSMENT:  CLINICAL IMPRESSION:  Mr Schoeppner presents to skilled PT reporting that he had some increased  pain last night after using the weed eater.  When asked if he remembered to engage his core muscles, he verbalized that he thinks that he did not.  Educated patient about improved form with use of yard equipment and maintaining core and not twisting too much.  Patient added more core exercises today and updated HEP.  Additionally continued to progress balance and added tandem gait to HEP.  Provided patient with updated HEP print-out.  Patient continues to require skilled PT to progress towards goal related exercises.  OBJECTIVE IMPAIRMENTS: decreased endurance, decreased mobility, difficulty walking, hypomobility, increased muscle spasms,  improper body mechanics, postural dysfunction, and pain.   ACTIVITY LIMITATIONS: lifting, sitting, standing, squatting, and caring for others  PARTICIPATION LIMITATIONS: meal prep, laundry, community activity, occupation, and yard work  PERSONAL FACTORS: 1 comorbidity: LBP, MI 9 months ago are also affecting patient's functional outcome.   REHAB POTENTIAL: Good  CLINICAL DECISION MAKING: Stable/uncomplicated  EVALUATION COMPLEXITY: Low   GOALS: Goals reviewed with patient? Yes  SHORT TERM GOALS: Target date: 07/13/2024    Be independent in initial HEP Baseline: Goal status: Met on 07/06/2024  2.  Improve Modified Oswestry to < or = to 28% disability Baseline: 36% (18/50) Goal status: MET on 07/26/24 (0%)  3.  Initiate return to regular cardio and core strength routine and verbalize understanding of how to safely progress Baseline:  Goal status: Met on 07/28/24  4.  Report < or = to 2/10 Rt sided LBP with sitting and driving for work  Baseline: 3-4/10 Goal status: Met on 07/26/24  5.  Verbalize and demonstrate body mechanics modifications for lumbar protection with daily tasks  Baseline:  Goal status: Progressing (still requires cuing on 07/26/24)    LONG TERM GOALS: Target date: 08/10/2024   Be independent in advanced HEP Baseline:  Goal  status: Progressing   2.  Improve Modified Oswestry to < or = to 20% disability  Baseline: 18/50=36% Goal status: MET on 07/26/24 (0%)  3.  Report < or = to 1/10 Rt sided LBP with sitting and driving for work  Baseline: 4/10 Goal status: Progressing (reports pain of 1-2/10 max with activities)  4.  Return to regular exercise routine without limitation due to LBP Baseline: not doing regular exercise  Goal status: Progressing    PLAN:  PT FREQUENCY: 2x/week  PT DURATION: 8 weeks  PLANNED INTERVENTIONS: 97110-Therapeutic exercises, 97530- Therapeutic activity, 97112- Neuromuscular re-education, 97535- Self Care, [id number]- Manual therapy, [id number]- Canalith repositioning, [id number]- Aquatic Therapy, [id number]- Electrical stimulation (unattended), [phone]- Electrical stimulation (manual), 20560 (1-2 muscles), 20561 (3+ muscles)- Dry Needling, Patient/Family education, Balance training, Stair training, Taping, Joint mobilization, Spinal manipulation, and Spinal mobilization.  PLAN FOR NEXT SESSION: continue postural strengthening, focus on balancing exercises for next visit, single limb stance, standing leg abd, dead lifts, squats   Jarrell Laming, PT, DPT 07/28/24, 10:38 AM  [facility] [address] [place], [place] [address] Phone # [phone] Fax [phone]

## 2024-08-02 ENCOUNTER — Ambulatory Visit: Admitting: Gastroenterology

## 2024-08-03 ENCOUNTER — Ambulatory Visit: Attending: Hospitalist | Admitting: Rehabilitative and Restorative Service Providers"

## 2024-08-03 ENCOUNTER — Encounter: Payer: Self-pay | Admitting: Rehabilitative and Restorative Service Providers"

## 2024-08-03 DIAGNOSIS — M25652 Stiffness of left hip, not elsewhere classified: Secondary | ICD-10-CM | POA: Insufficient documentation

## 2024-08-03 DIAGNOSIS — M25651 Stiffness of right hip, not elsewhere classified: Secondary | ICD-10-CM | POA: Diagnosis present

## 2024-08-03 DIAGNOSIS — R252 Cramp and spasm: Secondary | ICD-10-CM | POA: Insufficient documentation

## 2024-08-03 DIAGNOSIS — M5459 Other low back pain: Secondary | ICD-10-CM | POA: Insufficient documentation

## 2024-08-03 NOTE — Therapy (Signed)
 OUTPATIENT PHYSICAL THERAPY TREATMENT NOTE   Patient Name: Billy Andersen prefers being called Billy Andersen MRN: [id number] DOB:Aug 31, 1976, 48 y.o., male Today's Date: 08/03/2024  END OF SESSION:  PT End of Session - 08/03/24 1013     Visit Number 8    Date for PT Re-Evaluation 08/10/24    Authorization Type [employer]-auth required at 27 visits    PT Start Time 0932    PT Stop Time 1013    PT Time Calculation (min) 41 min    Activity Tolerance Patient tolerated treatment well    Behavior During Therapy [facility] for tasks assessed/performed              Past Medical History:  Diagnosis Date   ADHD    Anxiety    Aortic dilatation (HCC)    Ascending, 43 mm, Dr. 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  Iron normal   Ruptured plantar fascia    left, 2022   Past Surgical History:  Procedure Laterality Date   CORONARY STENT INTERVENTION N/A 09/14/2023   Procedure: CORONARY STENT INTERVENTION;  Surgeon: Ladona Heinz, MD;  Location: [facility] INVASIVE CV LAB;  Service: Cardiovascular;  Laterality: N/A;   EXTERNAL EAR SURGERY Right    trauma (bit off)   LEFT HEART CATH AND CORONARY ANGIOGRAPHY N/A 09/14/2023   Procedure: LEFT HEART CATH AND CORONARY ANGIOGRAPHY;  Surgeon: Ladona Heinz, MD;  Location: [facility] INVASIVE CV LAB;  Service: Cardiovascular;  Laterality: N/A;   LEFT HEART CATH AND CORONARY ANGIOGRAPHY N/A 09/22/2023   Procedure: LEFT HEART CATH AND CORONARY ANGIOGRAPHY;   Surgeon: [name], MD;  [facility] INVASIVE CV LAB;  Service: Cardiovascular;  Laterality: N/A;   Rhythm monitoring     12/2023 multiple episodes of SVT--> DrRONITA Alveta started metoprolol    TRANSTHORACIC ECHOCARDIOGRAM     01/2024 nl except aortic dilat 43 mm.  05/2024 same.   Patient Active Problem List   Diagnosis Date Noted   Back pain 06/09/2024   ADHD 06/08/2024   Bipolar disorder (HCC) 06/08/2024   Acute back pain 06/07/2024   History of multiple concussions 04/03/2024   Cognitive and behavioral changes 04/03/2024   Ascending aorta dilation (HCC) 10/02/2023   Pure hypercholesterolemia 09/22/2023   Chest pain 09/21/2023   Dyspnea 09/21/2023   Non-ST elevation (NSTEMI) myocardial infarction [facility]) 09/15/2023   CAD (coronary artery disease) 09/15/2023    PCP:  [name], MD   REFERRING PROVIDER: [name], MD   REFERRING DIAG: M54.42 (ICD-10-CM) - Acute midline low back pain with left-sided sciatica   Rationale for Evaluation and Treatment: Rehabilitation  THERAPY DIAG:  Stiffness of left hip, not elsewhere classified  Cramp and spasm  Stiffness of right hip, not elsewhere classified  Other low back pain  ONSET DATE: 2 weeks ago  SUBJECTIVE:  SUBJECTIVE STATEMENT: Pt reports having only mild back pain today. He states he's been doing his HEP and that it's helping.   PERTINENT HISTORY:  MI 09/2023 with stent placement.   PAIN:  Are you having pain? Yes: NPRS scale: 2/10 just stiffness today Pain location: Rt low back into Rt buttock at time Pain description: sore, was intense at times  Aggravating factors: bending away, twisting, sitting, standing, walking long periods  Relieving factors: change position, rest   PRECAUTIONS: None  RED  FLAGS: None   WEIGHT BEARING RESTRICTIONS: No  FALLS:  Has patient fallen in last 6 months? No  LIVING ENVIRONMENT: Lives with: lives with their family Lives in: House/apartment Stairs: Yes: External: 3 steps; on right going up   OCCUPATION: broker for accident/injury-desk work with travel   PLOF: Independent, Vocation/Vocational requirements: desk work, driving, and Leisure: play with kids  PATIENT GOALS: reduce pain, improve core strength   NEXT MD VISIT: PCP 06/16/24  OBJECTIVE:  Note: Objective measures were completed at Evaluation unless otherwise noted.  DIAGNOSTIC FINDINGS:  MRI 06/08/24:  IMPRESSION: 1. Lumbar spondylosis as outlined within the body of the report. 2. At L4-L5, there is 2 mm grade 1 anterolisthesis. Moderate facet arthropathy with ligamentum flavum hypertrophy. Small bilateral facet joint effusions. Small bilateral posteriorly projecting synovial facet cyst. Moderate right subarticular stenosis with potential to affect the descending right L5 nerve root. Mild left subarticular stenosis. Mild left neural foraminal narrowing. 3. At L5-S1, there is mild disc degeneration. Disc bulge. No significant spinal canal stenosis. Mild left neural foraminal narrowing. 4. No significant disc herniation, spinal canal stenosis or neural foraminal narrowing at the remaining lumbar levels. 5. Marrow edema and enhancement within the bilateral L5 pars interarticulari/pedicles. This may be degenerative and related to facet arthropathy, or may reflect stress reaction.    PATIENT SURVEYS:   06/15/24: Modified Oswestry: 18/50=36% disability   07/26/24: Modified Oswestry: 0/50 = 0% Disability   COGNITION: Overall cognitive status: Within functional limits for tasks assessed     SENSATION: WFL  MUSCLE LENGTH: Hip flexibility limited by 50% bil   POSTURE: rounded shoulders and flexed trunk   PALPATION: Reduced segmental mobility in the lumbar spine without pain, tension  in Rt lumbar paraspinals with minimal pain L4/5  LUMBAR ROM:   Limited by 50% in all directions with reduced segmental mobility.  Pain with Lt sidebending over Rt lumbar spine   LOWER EXTREMITY MMT:   4+/5 bil LE strength   LUMBAR SPECIAL TESTS:  Straight leg raise test: Negative and Slump test: Negative  GAIT: Distance walked: 50 ft Assistive device utilized: None Level of assistance: Complete Independence Comments: WNL  TREATMENT DATE:   08/03/24 Nustep level 6 for 6 min - PT present to discuss pt status Seated hamstring stretch 2x30 sec Seated piriformis stretch 2x30 sec Quadruped transversus abdominus contraction 2x10 Quadruped alt UE/LE extension (bird dog) 2x10 bilat Snow angels while supine on foam roller 2x10  Shoulder abduction with blue band while supine on foam roller 2x10 Standing open book with blue band x10 each side Single limb balance -Up with right, down with right/up left, down left on bosu (blue part) 2x10 each side using 2 dowels for UE support  Lunges both forw/backw and lateral onto bosu (blue part) x10 each side UE support used  Tandem walking forw/backw approx 10 ft down and back 5x- had difficulty  Rocker board for DF/PF x2 min Standing balance on rocker board in lateral position x1 min with UE  support, as needed   07/28/2024: Seated hamstring stretch 2x20 sec bilat Seated piriformis stretch 2x20 sec bilat Quadruped transversus abdominus contraction 2x10 Quadruped alt LE extension (donkey kicks) x10 bilat Quadruped alt UE/LE extension (bird dog) x10 bilat Supine dying bug 2x10 Supine on soft foam roll performing snow angels 2x10 Supine on soft foam roll performing horizontal shoulder abduction with green tband 2x10 Tandem walking down the PT hallway down and back 4x10 ft Backwards tandem walking in hallway  Rocker board for DF/PF x2 min Standing balance on rocker board in lateral position x1 min with UE support, as needed Recumbent bike level 4  x5 min with PT present to discuss status   07/26/24 Nustep level 5 x5 min with PT student present to discuss status Oswestry 0% Goals reassessment Leg press seat 6 on 125# 3x10 Standing abdominal bracing 3x8 Single limb balance one leg on wall alternating 10# dumbell 3x10 on each leg-verbal cues to breathe and to engage core  Single limb heel raise from floor with other foot on 6 in step, holding 8# weights bil hands 3x8 Patient education- proper body mechanics for doing dead lift for picking up objects off floor vs doing a squat Foam rolling education/practice for bil gastroc x30 sec each leg Airex tandem 3 laps (down and back)  Airex lateral side stepping 3 laps Single limb stance 2x 30sec each side-verbal cues for mind to muscle connection within foot receptors to challenge proprioception       PATIENT EDUCATION:  Education details: Access Code: [email or web address] Person educated: Patient Education method: Explanation, Demonstration, and Handouts Education comprehension: verbalized understanding and returned demonstration  HOME EXERCISE PROGRAM: Access Code: [email or web address] URL: [email or web address] Date: 07/28/2024 Prepared by: Jarrell Menke  Exercises - Seated Hamstring Stretch  - 3 x daily - 7 x weekly - 1 sets - 3 reps - 20 hold - Seated Figure 4 Piriformis Stretch  - 3 x daily - 7 x weekly - 1 sets - 1 reps - 20 hold - Standing Shoulder Horizontal Abduction with Resistance  - 1 x daily - 7 x weekly - 2 sets - 10 reps - Supine Lower Trunk Rotation  - 3 x daily - 7 x weekly - 1 sets - 3 reps - 20 hold - Hooklying Single Knee to Chest  - 3 x daily - 7 x weekly - 1 sets - 3 reps - 20 hold - Supine Posterior Pelvic Tilt  - 1 x daily - 7 x weekly - 2 sets - 10 reps - Supine Dead Bug with Leg Extension  - 1 x daily - 7 x weekly - 2 sets - 10 reps - Quadruped Transversus Abdominis Bracing  - 1 x daily - 7 x weekly - 2 sets - 10 reps - Bird Dog  - 1 x daily - 7 x weekly - 2 sets  - 10 reps - Tandem Walking  - 1 x daily - 7 x weekly - 2 sets - 10 reps  ASSESSMENT:  CLINICAL IMPRESSION:  Mr Mandigo presents to skilled therapy with improved functional performance and less pain since initial eval, however continues to need neuromuscular reeducation with functional activities like squatting and bending for doing yardwork/housework, stating that's when he gets back pain throughout the week. Today's session targeted proprioceptive challenging exercises pt tolerated well, with greater difficulty during walking tandem, mostly by falling out of line. Pt will have reassessment next visit to assess for discharge with HEP or continued PT to further progress with goals.  OBJECTIVE IMPAIRMENTS: decreased endurance, decreased mobility, difficulty walking, hypomobility, increased muscle spasms, improper body mechanics, postural dysfunction, and pain.   ACTIVITY LIMITATIONS: lifting, sitting, standing, squatting, and caring for others  PARTICIPATION LIMITATIONS: meal prep, laundry, community activity, occupation, and yard work  PERSONAL FACTORS: 1 comorbidity: LBP, MI 9 months ago are also affecting patient's functional outcome.   REHAB POTENTIAL: Good  CLINICAL DECISION MAKING: Stable/uncomplicated  EVALUATION COMPLEXITY: Low   GOALS: Goals reviewed with patient? Yes  SHORT TERM GOALS: Target date: 07/13/2024    Be independent in initial HEP Baseline: Goal status: Met on 07/06/2024  2.  Improve Modified Oswestry to < or = to 28% disability Baseline: 36% (18/50) Goal status: MET on 07/26/24 (0%)  3.  Initiate return to regular cardio and core strength routine and verbalize understanding of how to safely progress Baseline:  Goal status: Met on 07/28/24  4.  Report < or = to 2/10 Rt sided LBP with sitting and driving for work  Baseline: 3-4/10 Goal status: Met on 07/26/24  5.  Verbalize and demonstrate body mechanics modifications for lumbar protection with daily tasks   Baseline:  Goal status: Met on 08/03/24    LONG TERM GOALS: Target date: 08/10/2024   Be independent in advanced HEP Baseline:  Goal status: Progressing   2.  Improve Modified Oswestry to < or = to 20% disability  Baseline: 18/50=36% Goal status: MET on 07/26/24 (0%)  3.  Report < or = to 1/10 Rt sided LBP with sitting and driving for work  Baseline: 4/10 Goal status: Progressing (reports pain of 1-2/10 max with activities)  4.  Return to regular exercise routine without limitation due to LBP Baseline: not doing regular exercise  Goal status: Progressing    PLAN:  PT FREQUENCY: 2x/week  PT DURATION: 8 weeks  PLANNED INTERVENTIONS: 97110-Therapeutic exercises, 97530- Therapeutic activity, 97112- Neuromuscular re-education, 97535- Self Care, [id number]- Manual therapy, [id number]- Canalith repositioning, [id number]- Aquatic Therapy, [id number]- Electrical stimulation (unattended), [phone]- Electrical stimulation (manual), 20560 (1-2 muscles), 20561 (3+ muscles)- Dry Needling, Patient/Family education, Balance training, Stair training, Taping, Joint mobilization, Spinal manipulation, and Spinal mobilization.  PLAN FOR NEXT SESSION: reassessment to determine DC vs continued PT   Lavanda Cleverly, SPT 08/03/24 12:16 PM   I agree with the following treatment note after reviewing documentation. This session was performed under the supervision of a licensed clinician. Jarrell Laming, PT, DPT 08/03/24, 12:16 PM  [facility] [address], [address] [place], [place] [address] Phone # [phone] Fax [phone]

## 2024-08-05 ENCOUNTER — Ambulatory Visit: Admitting: Rehabilitative and Restorative Service Providers"

## 2024-08-05 ENCOUNTER — Encounter: Payer: Self-pay | Admitting: Rehabilitative and Restorative Service Providers"

## 2024-08-05 DIAGNOSIS — R252 Cramp and spasm: Secondary | ICD-10-CM

## 2024-08-05 DIAGNOSIS — M25652 Stiffness of left hip, not elsewhere classified: Secondary | ICD-10-CM

## 2024-08-05 DIAGNOSIS — M25651 Stiffness of right hip, not elsewhere classified: Secondary | ICD-10-CM

## 2024-08-05 DIAGNOSIS — M5459 Other low back pain: Secondary | ICD-10-CM

## 2024-08-05 NOTE — Therapy (Signed)
 OUTPATIENT PHYSICAL THERAPY TREATMENT NOTE AND DISCHARGE SUMMARY   Patient Name: Billy Andersen prefers being called Billy Andersen MRN: [id number] DOB:19-Oct-1976, 48 y.o., male Today's Date: 08/05/2024  END OF SESSION:  PT End of Session - 08/05/24 0933     Visit Number 9    Date for PT Re-Evaluation 08/10/24    Authorization Type [employer]-auth required at 27 visits    PT Start Time 0931    PT Stop Time 1010    PT Time Calculation (min) 39 min    Activity Tolerance Patient tolerated treatment well    Behavior During Therapy [facility] for tasks assessed/performed              Past Medical History:  Diagnosis Date   ADHD    Anxiety    Aortic dilatation (HCC)    Ascending, 43 mm, Dr. 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  Iron normal   Ruptured plantar fascia    left, 2022   Past Surgical History:  Procedure Laterality Date   CORONARY STENT INTERVENTION N/A 09/14/2023   Procedure: CORONARY STENT INTERVENTION;  Surgeon: Ladona Heinz, MD;  Location: [facility] INVASIVE CV LAB;  Service: Cardiovascular;  Laterality: N/A;   EXTERNAL EAR SURGERY Right    trauma (bit off)   LEFT HEART CATH AND CORONARY ANGIOGRAPHY N/A 09/14/2023   Procedure: LEFT HEART CATH AND CORONARY ANGIOGRAPHY;  Surgeon: Ladona Heinz, MD;  Location: [facility] INVASIVE CV LAB;  Service: Cardiovascular;  Laterality: N/A;   LEFT HEART CATH AND CORONARY ANGIOGRAPHY N/A 09/22/2023   Procedure: LEFT HEART CATH AND  CORONARY ANGIOGRAPHY;  Surgeon: [name], MD;  Location: [facility] INVASIVE CV LAB;  Service: Cardiovascular;  Laterality: N/A;   Rhythm monitoring     12/2023 multiple episodes of SVT--> DrRONITA Alveta started metoprolol    TRANSTHORACIC ECHOCARDIOGRAM     01/2024 nl except aortic dilat 43 mm.  05/2024 same.   Patient Active Problem List   Diagnosis Date Noted   Back pain 06/09/2024   ADHD 06/08/2024   Bipolar disorder (HCC) 06/08/2024   Acute back pain 06/07/2024   History of multiple concussions 04/03/2024   Cognitive and behavioral changes 04/03/2024   Ascending aorta dilation (HCC) 10/02/2023   Pure hypercholesterolemia 09/22/2023   Chest pain 09/21/2023   Dyspnea 09/21/2023   Non-ST elevation (NSTEMI) myocardial infarction [facility]) 09/15/2023   CAD (coronary artery disease) 09/15/2023    PCP:  [name], MD   REFERRING PROVIDER: [name], MD   REFERRING DIAG: M54.42 (ICD-10-CM) - Acute midline low back pain with left-sided sciatica   Rationale for Evaluation and Treatment: Rehabilitation  THERAPY DIAG:  Stiffness of left hip, not elsewhere classified  Cramp and spasm  Stiffness of right hip, not elsewhere classified  Other low back pain  ONSET DATE: 2 weeks ago  SUBJECTIVE:  SUBJECTIVE STATEMENT: Pt reports that he has returned to his normal activities and only has occasional minor pain of 1/10.  PERTINENT HISTORY:  MI 09/2023 with stent placement.   PAIN:  Are you having pain? Yes: NPRS scale: 1/10 just stiffness today Pain location: Low back Pain description: sore Aggravating factors: bending away, twisting, sitting, standing, walking long periods  Relieving factors: change position, rest   PRECAUTIONS: None  RED FLAGS: None   WEIGHT BEARING RESTRICTIONS:  No  FALLS:  Has patient fallen in last 6 months? No  LIVING ENVIRONMENT: Lives with: lives with their family Lives in: House/apartment Stairs: Yes: External: 3 steps; on right going up   OCCUPATION: broker for accident/injury-desk work with travel   PLOF: Independent, Vocation/Vocational requirements: desk work, driving, and Leisure: play with kids  PATIENT GOALS: reduce pain, improve core strength   NEXT MD VISIT: PCP 06/16/24  OBJECTIVE:  Note: Objective measures were completed at Evaluation unless otherwise noted.  DIAGNOSTIC FINDINGS:  MRI 06/08/24:  IMPRESSION: 1. Lumbar spondylosis as outlined within the body of the report. 2. At L4-L5, there is 2 mm grade 1 anterolisthesis. Moderate facet arthropathy with ligamentum flavum hypertrophy. Small bilateral facet joint effusions. Small bilateral posteriorly projecting synovial facet cyst. Moderate right subarticular stenosis with potential to affect the descending right L5 nerve root. Mild left subarticular stenosis. Mild left neural foraminal narrowing. 3. At L5-S1, there is mild disc degeneration. Disc bulge. No significant spinal canal stenosis. Mild left neural foraminal narrowing. 4. No significant disc herniation, spinal canal stenosis or neural foraminal narrowing at the remaining lumbar levels. 5. Marrow edema and enhancement within the bilateral L5 pars interarticulari/pedicles. This may be degenerative and related to facet arthropathy, or may reflect stress reaction.    PATIENT SURVEYS:   06/15/24: Modified Oswestry: 18/50=36% disability   07/26/24: Modified Oswestry: 0/50 = 0% Disability   COGNITION: Overall cognitive status: Within functional limits for tasks assessed     SENSATION: WFL  MUSCLE LENGTH: Hip flexibility limited by 50% bil   POSTURE: rounded shoulders and flexed trunk   PALPATION: Reduced segmental mobility in the lumbar spine without pain, tension in Rt lumbar paraspinals with minimal pain  L4/5  LUMBAR ROM:   Limited by 50% in all directions with reduced segmental mobility.  Pain with Lt sidebending over Rt lumbar spine   LOWER EXTREMITY MMT:   4+/5 bil LE strength   LUMBAR SPECIAL TESTS:  Straight leg raise test: Negative and Slump test: Negative  GAIT: Distance walked: 50 ft Assistive device utilized: None Level of assistance: Complete Independence Comments: WNL  TREATMENT DATE:   08/05/2024: Nustep level 6 for 6 min - PT present to discuss pt status Seated hamstring stretch 2x30 sec Seated piriformis stretch 2x30 sec Quadruped transversus abdominus contraction 2x10 Quadruped alt UE/LE extension (bird dog) 2x10 bilat Snow angels while supine on soft foam roller 2x10  Shoulder abduction with blue band while supine on soft foam roller 2x10, then x10 with black tband Standing rows with black tband 2x10 Standing pallof press with black tband 2x10 bilat Tandem gait on AirEx foam FWD and backwards with UE support as needed in parallel bars x4 each Single leg stance diagonal cone touch (cones on floor) x10 bilat Single leg stance clock tap x10 bilat FWD lunge with rotation holding 10# kettle bell x5 bilat   08/03/24 Nustep level 6 for 6 min - PT present to discuss pt status Seated hamstring stretch 2x30 sec Seated piriformis stretch 2x30 sec Quadruped transversus  abdominus contraction 2x10 Quadruped alt UE/LE extension (bird dog) 2x10 bilat Snow angels while supine on foam roller 2x10  Shoulder abduction with blue band while supine on foam roller 2x10 Standing open book with blue band x10 each side Single limb balance -Up with right, down with right/up left, down left on bosu (blue part) 2x10 each side using 2 dowels for UE support  Lunges both forw/backw and lateral onto bosu (blue part) x10 each side UE support used  Tandem walking forw/backw approx 10 ft down and back 5x- had difficulty  Rocker board for DF/PF x2 min Standing balance on rocker board in  lateral position x1 min with UE support, as needed   07/28/2024: Seated hamstring stretch 2x20 sec bilat Seated piriformis stretch 2x20 sec bilat Quadruped transversus abdominus contraction 2x10 Quadruped alt LE extension (donkey kicks) x10 bilat Quadruped alt UE/LE extension (bird dog) x10 bilat Supine dying bug 2x10 Supine on soft foam roll performing snow angels 2x10 Supine on soft foam roll performing horizontal shoulder abduction with green tband 2x10 Tandem walking down the PT hallway down and back 4x10 ft Backwards tandem walking in hallway  Rocker board for DF/PF x2 min Standing balance on rocker board in lateral position x1 min with UE support, as needed Recumbent bike level 4 x5 min with PT present to discuss status     PATIENT EDUCATION:  Education details: Access Code: [email or web address] Person educated: Patient Education method: Explanation, Demonstration, and Handouts Education comprehension: verbalized understanding and returned demonstration  HOME EXERCISE PROGRAM: Access Code: [email or web address] URL: [email or web address] Date: 08/05/2024 Prepared by: Jarrell Trinka Keshishyan  Exercises - Seated Hamstring Stretch  - 3 x daily - 7 x weekly - 1 sets - 3 reps - 20 hold - Seated Figure 4 Piriformis Stretch  - 3 x daily - 7 x weekly - 1 sets - 1 reps - 20 hold - Standing Shoulder Horizontal Abduction with Resistance  - 1 x daily - 7 x weekly - 2 sets - 10 reps - Supine Lower Trunk Rotation  - 3 x daily - 7 x weekly - 1 sets - 3 reps - 20 hold - Hooklying Single Knee to Chest  - 3 x daily - 7 x weekly - 1 sets - 3 reps - 20 hold - Supine Posterior Pelvic Tilt  - 1 x daily - 7 x weekly - 2 sets - 10 reps - Supine Dead Bug with Leg Extension  - 1 x daily - 7 x weekly - 2 sets - 10 reps - Quadruped Transversus Abdominis Bracing  - 1 x daily - 7 x weekly - 2 sets - 10 reps - Bird Dog  - 1 x daily - 7 x weekly - 2 sets - 10 reps - Tandem Walking  - 1 x daily - 7 x weekly - 2 sets - 10  reps - Single Leg Balance with Clock Reach  - 1 x daily - 7 x weekly - 2 sets - 10 reps - Diagonal Forward Reaches  - 1 x daily - 7 x weekly - 2 sets - 10 reps - Standing Anti-Rotation Press with Anchored Resistance  - 1 x daily - 7 x weekly - 2 sets - 10 reps - Lunge with Rotation and Medicine Ball  - 1 x daily - 7 x weekly - 2 sets - 10 reps  ASSESSMENT:  CLINICAL IMPRESSION:  Mr Kervin presents to skilled therapy reporting that he is feeling better and feels that he can continue independently with his  HEP.  Patient has met all goals at this time and states that he does not have difficulty with performing exercises at home.  Patient provided with black theraband and updated HEP during session today.  Patient able to perform all exercises today without reports of increased pain.  Patient states that he is glad to have exercises to continue to progress his balance at home.  Patient has met all goals and is ready for discharge from skilled PT today to continue with HEP.  OBJECTIVE IMPAIRMENTS: decreased endurance, decreased mobility, difficulty walking, hypomobility, increased muscle spasms, improper body mechanics, postural dysfunction, and pain.   ACTIVITY LIMITATIONS: lifting, sitting, standing, squatting, and caring for others  PARTICIPATION LIMITATIONS: meal prep, laundry, community activity, occupation, and yard work  PERSONAL FACTORS: 1 comorbidity: LBP, MI 9 months ago are also affecting patient's functional outcome.   REHAB POTENTIAL: Good  CLINICAL DECISION MAKING: Stable/uncomplicated  EVALUATION COMPLEXITY: Low   GOALS: Goals reviewed with patient? Yes  SHORT TERM GOALS: Target date: 07/13/2024    Be independent in initial HEP Baseline: Goal status: Met on 07/06/2024  2.  Improve Modified Oswestry to < or = to 28% disability Baseline: 36% (18/50) Goal status: MET on 07/26/24 (0%)  3.  Initiate return to regular cardio and core strength routine and verbalize  understanding of how to safely progress Baseline:  Goal status: Met on 07/28/24  4.  Report < or = to 2/10 Rt sided LBP with sitting and driving for work  Baseline: 3-4/10 Goal status: Met on 07/26/24  5.  Verbalize and demonstrate body mechanics modifications for lumbar protection with daily tasks  Baseline:  Goal status: Met on 08/03/24    LONG TERM GOALS: Target date: 08/10/2024   Be independent in advanced HEP Baseline:  Goal status: Met on 08/05/2024  2.  Improve Modified Oswestry to < or = to 20% disability  Baseline: 18/50=36% Goal status: MET on 07/26/24 (0%)  3.  Report < or = to 1/10 Rt sided LBP with sitting and driving for work  Baseline: 4/10 Goal status: Met on 08/05/24  4.  Return to regular exercise routine without limitation due to LBP Baseline: not doing regular exercise  Goal status: Met on 08/05/24   PLAN:  PT FREQUENCY: 2x/week  PT DURATION: 8 weeks  PLANNED INTERVENTIONS: 97110-Therapeutic exercises, 97530- Therapeutic activity, 97112- Neuromuscular re-education, 97535- Self Care, [id number]- Manual therapy, [phone]- Canalith repositioning, [id number]- Aquatic Therapy, [id number]- Electrical stimulation (unattended), [phone]- Electrical stimulation (manual), 20560 (1-2 muscles), 20561 (3+ muscles)- Dry Needling, Patient/Family education, Balance training, Stair training, Taping, Joint mobilization, Spinal manipulation, and Spinal mobilization.  PHYSICAL THERAPY DISCHARGE SUMMARY  Visits from Start of Care: 9  Current functional level related to goals / functional outcomes: See above   Remaining deficits: none   Education / Equipment: Provided new HEP and provided with black theraband   Patient agrees to discharge. Patient goals were met. Patient is being discharged due to meeting the stated rehab goals.    Jarrell Laming, PT, DPT 08/05/24, 10:32 AM  [facility] [address], [address] [place], [place] [address] Phone # [phone] Fax  [phone]

## 2024-08-18 DIAGNOSIS — F418 Other specified anxiety disorders: Secondary | ICD-10-CM | POA: Insufficient documentation

## 2024-08-25 ENCOUNTER — Other Ambulatory Visit: Payer: Self-pay | Admitting: Physician Assistant

## 2024-09-13 ENCOUNTER — Telehealth: Payer: Self-pay | Admitting: Cardiology

## 2024-09-13 NOTE — Telephone Encounter (Signed)
 Pt c/o medication issue:  1. Name of Medication: clopidogrel  (PLAVIX ) 75 MG tablet    rosuvastatin  (CRESTOR ) 40 MG tablet    2. How are you currently taking this medication (dosage and times per day)?    3. Are you having a reaction (difficulty breathing--STAT)? no  4. What is your medication issue? Patient calling to see if he stop taking these two medication. Please advise  .

## 2024-09-13 NOTE — Telephone Encounter (Signed)
 Called pt reports was told could stop clopidogrel  and rosuvastatin  after 1 year.   Pt had NSTEMI Oct 2024.    Advised pt per MD note from 05/17/24 1 coronary artery disease-continue aspirin , Plavix  and statin.  Patient denies recurrent chest pain.  Will discontinue Plavix  November of this year.  Also advised pt that will need to continue taking rosuvastatin .  Pt had no further questions or concerns.

## 2024-09-15 ENCOUNTER — Ambulatory Visit

## 2024-09-15 DIAGNOSIS — Z23 Encounter for immunization: Secondary | ICD-10-CM

## 2024-09-26 ENCOUNTER — Other Ambulatory Visit: Payer: Self-pay

## 2024-09-27 MED ORDER — METOPROLOL TARTRATE 25 MG PO TABS: 25.0000 mg | ORAL_TABLET | Freq: Two times a day (BID) | ORAL | 2 refills | Status: AC

## 2024-09-30 ENCOUNTER — Ambulatory Visit: Admitting: Nurse Practitioner

## 2024-10-09 LAB — COLOGUARD: COLOGUARD: NEGATIVE

## 2024-11-10 ENCOUNTER — Ambulatory Visit (INDEPENDENT_AMBULATORY_CARE_PROVIDER_SITE_OTHER): Admitting: Nurse Practitioner

## 2024-11-10 ENCOUNTER — Other Ambulatory Visit (INDEPENDENT_AMBULATORY_CARE_PROVIDER_SITE_OTHER)

## 2024-11-10 ENCOUNTER — Encounter: Payer: Self-pay | Admitting: Nurse Practitioner

## 2024-11-10 ENCOUNTER — Ambulatory Visit: Payer: Self-pay | Admitting: Nurse Practitioner

## 2024-11-10 VITALS — BP 100/72 | HR 64 | Ht 72.5 in | Wt 224.0 lb

## 2024-11-10 DIAGNOSIS — K760 Fatty (change of) liver, not elsewhere classified: Secondary | ICD-10-CM

## 2024-11-10 LAB — HEPATIC FUNCTION PANEL
ALT: 23 U/L (ref 0–53)
AST: 23 U/L (ref 0–37)
Albumin: 4.8 g/dL (ref 3.5–5.2)
Alkaline Phosphatase: 44 U/L (ref 39–117)
Bilirubin, Direct: 0.2 mg/dL (ref 0.0–0.3)
Total Bilirubin: 0.6 mg/dL (ref 0.2–1.2)
Total Protein: 6.6 g/dL (ref 6.0–8.3)

## 2024-11-10 NOTE — Progress Notes (Signed)
 11/10/2024 Billy Andersen [id number] [birth year]   CHIEF COMPLAINT: Fatty liver   HISTORY OF PRESENT ILLNESS: Billy Andersen is a 48 year old male with a past medical history of anxiety, MASH, dilated aortic root per ECHO, coronary artery disease s/p NSTEMI and stent placement to the RCA 09/2023 on Plavix  x and ASA x 1 year, now only on ASA.   He was initially seen in office by Con Blower, [name] 03/14/2024 for further evaluation regarding hepatic steatosis and to discuss colon cancer screening.  Abdominal ultrasound with elastography was ordered and a colonoscopy was deferred until 08/2024 when he would no longer be on Plavix .  He subsequently underwent abdominal ultrasound elastography 03/17/2024 which showed a median kPa score of 9 which potentially indicated increased risk for advanced liver disease and further testing recommended.  He presents today for further follow-up regarding fatty liver.  He recently researched Rezdiffra  and stated he is not interested in starting this medication and prefers to treat his fatty liver naturally.  His most recent laboratory studies 06/08/2024 showed a normal albumin level, platelet count and LFTs.  He denies any alcohol use.  He has gained 10 pounds over the past 5 months.  He was last seen by Stephane Quest at [facility] 01/10/2021.  Regard to colon cancer screening, he pleated a Cologuard test 10/02/2024 which was negative.  No known family history of colon cancer.  Bowel movements are normal.  No bloody or black stools.     Latest Ref Rng & Units 06/08/2024    3:35 AM 06/07/2024    8:37 PM 11/17/2023    1:22 PM  CBC  WBC 4.0 - 10.5 K/uL 8.8  13.0  4.1   Hemoglobin 13.0 - 17.0 g/dL 84.3  84.0  83.6   Hematocrit 39.0 - 52.0 % 45.7  46.2  47.6   Platelets 150 - 400 K/uL 161  184  183        Latest Ref Rng & Units 06/08/2024    3:35 AM 06/07/2024    8:37 PM 12/08/2023    9:24 AM  CMP  Glucose 70 - 99 mg/dL 879  882  65   BUN 6  - 20 mg/dL 13  13  11    Creatinine 0.61 - 1.24 mg/dL 9.16  9.12  8.96   Sodium 135 - 145 mmol/L 139  140  142   Potassium 3.5 - 5.1 mmol/L 3.5  3.7  3.9   Chloride 98 - 111 mmol/L 102  101  104   CO2 22 - 32 mmol/L 27  29  24    Calcium  8.9 - 10.3 mg/dL 8.8  9.3  9.4   Total Protein 6.5 - 8.1 g/dL 6.2  6.8  6.6   Total Bilirubin 0.0 - 1.2 mg/dL 0.4  0.5  0.4   Alkaline Phos 38 - 126 U/L 42  45  59   AST 15 - 41 U/L 17  22  35   ALT 0 - 44 U/L 23  27  35     Labs 09/05/2020: ANA negative, L KM negative, AMA negative, ASMA negative, IgG 970, IgM 121, transferrin saturation 32%, ferritin 265, ceruloplasmin 25, HBsAb nonreactive, HBcAb total nonreactive, HIV nonreactive, HCV antibody nonreactive, HBsAg nonreactive, HAV total nonreactive, liver fibrotest F1-F2   Liver fibrotest in October 2021 reports F1-F2 fibrosis. FibroScan in November 2021 with F3 fibrosis and S3 steatosis.     Abdominal sonogram with elastography 03/17/2024: FINDINGS: ULTRASOUND ABDOMEN  LIMITED RIGHT UPPER QUADRANT   Gallbladder:   No gallstones or wall thickening visualized. No sonographic Murphy sign noted.   Common bile duct:   Diameter: 2 mm   Liver:   No focal lesion identified. Within normal limits in parenchymal echogenicity. Portal vein is patent on color Doppler imaging with normal direction of blood flow towards the liver.   ULTRASOUND HEPATIC ELASTOGRAPHY   Device: Siemens Helix VTQ   Patient position: Oblique   Transducer DA X   Number of measurements: 14   Hepatic segment:  8   Median kPa: 9   IQR: 5.1   IQR/Median kPa ratio: 0.57   Data quality: IQR/Median kPa ratio of 0.3 or greater indicates reduced accuracy   Diagnostic category: < or = 9 kPa: in the absence of other known clinical signs, rules out cACLD   The use of hepatic elastography is applicable to patients with viral hepatitis and non-alcoholic fatty liver disease. At this time, there is insufficient data for the  referenced cut-off values and use in other causes of liver disease, including alcoholic liver disease. Patients, however, may be assessed by elastography and serve as their own reference standard/baseline.   In patients with non-alcoholic liver disease, the values suggesting compensated advanced chronic liver disease (cACLD) may be lower, and patients may need additional testing with elasticity results of 7-9 kPa.   Please note that abnormal hepatic elasticity and shear wave velocities may also be identified in clinical settings other than with hepatic fibrosis, such as: acute hepatitis, elevated right heart and central venous pressures including use of beta blockers, veno-occlusive disease (Budd-Chiari), infiltrative processes such as mastocytosis/amyloidosis/infiltrative tumor/lymphoma, extrahepatic cholestasis, with hyperemia in the post-prandial state, and with liver transplantation. Correlation with patient history, laboratory data, and clinical condition recommended.   Diagnostic Categories:   < or =5 kPa: high probability of being normal   < or =9 kPa: in the absence of other known clinical signs, rules out cACLD   >9 kPa and ?13 kPa: suggestive of cACLD, but needs further testing   >13 kPa: highly suggestive of cACLD   > or =17 kPa: highly suggestive of cACLD with an increased probability of clinically significant portal hypertension   IMPRESSION: ULTRASOUND RUQ:   Normal right upper quadrant ultrasound.   ULTRASOUND HEPATIC ELASTOGRAPHY:   Median kPa:  9   Diagnostic category: < or = 9 kPa: in the absence of other known clinical signs, rules out cACLD    Past Medical History:  Diagnosis Date   ADHD    Anxiety    Aortic dilatation    Ascending, 43 mm, Dr. 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  Iron normal   Ruptured plantar fascia    left, 2022   Past Surgical History:  Procedure Laterality Date   CORONARY STENT INTERVENTION N/A 09/14/2023   Procedure: CORONARY STENT INTERVENTION;  Surgeon: Ladona Heinz, MD;  Location: [facility] INVASIVE CV LAB;  Service:  Cardiovascular;  Laterality: N/A;   EXTERNAL EAR SURGERY Right    trauma (bit off)   LEFT HEART CATH AND CORONARY ANGIOGRAPHY N/A 09/14/2023   Procedure: LEFT HEART CATH AND CORONARY ANGIOGRAPHY;  Surgeon: Ladona Heinz, MD;  Location: [facility] INVASIVE CV LAB;  Service: Cardiovascular;  Laterality: N/A;   LEFT HEART CATH AND CORONARY ANGIOGRAPHY N/A 09/22/2023   Procedure: LEFT HEART CATH AND CORONARY ANGIOGRAPHY;  Surgeon: Wonda Sharper, MD;  Location[facility] INVASIVE CV LAB;  Service: Cardiovascular;  Laterality: N/A;   Rhythm monitoring     12/2023 multiple episodes of SVT--> DrRONITA Passe started metoprolol    TRANSTHORACIC ECHOCARDIOGRAM     01/2024 nl except aortic dilat 43 mm.  05/2024 same.     Outpatient Encounter Medications as of 11/10/2024  Medication Sig   acetaminophen  (TYLENOL ) 325 MG tablet Take 2 tablets (650 mg total) by mouth every 6 (six) hours as needed for mild pain (pain score 1-3) (or Fever >/= 101).   ASHWAGANDHA GUMMIES PO Take 2 tablets by mouth See admin instructions. Chew 2 gummies by mouth in the morning   ASPIRIN  LOW DOSE 81 MG chewable tablet chew ONE TABLET BY MOUTH ONCE DAILY (Patient taking differently: Chew 81 mg by mouth in the morning.)   atomoxetine  (STRATTERA ) 80 MG capsule Take 80 mg by mouth in the morning.   CAPLYTA  42 MG capsule Take 42 mg by mouth at  bedtime.   clopidogrel  (PLAVIX ) 75 MG tablet TAKE ONE TABLET BY MOUTH EVERY DAY WITH BREAKFAST (Patient taking differently: Take 75 mg by mouth daily with breakfast.)   cyclobenzaprine  (FLEXERIL ) 10 MG tablet Take 1 tablet (10 mg total) by mouth 2 (two) times daily as needed for muscle spasms.   lamoTRIgine  (LAMICTAL ) 200 MG tablet Take 200 mg by mouth See admin instructions. Take 200 mg by mouth in the morning and at 4 PM   metoprolol  tartrate (LOPRESSOR ) 25 MG tablet Take 1 tablet (25 mg total) by mouth 2 (two) times daily.   Misc Natural Products (AIRBORNE ELDERBERRY) 100-50 MG CHEW Chew 2 tablets by mouth daily.   Misc Natural Products (PUMPKIN SEED OIL) CAPS Take 3 capsules by mouth in the morning.   nitroGLYCERIN  (NITROSTAT ) 0.4 MG SL tablet Place 1 tablet (0.4 mg total) under the tongue every 5 (five) minutes x 3 doses as needed for chest pain.   Omega Fatty Acids-Vitamins (OMEGA-3 GUMMIES) CHEW Chew 2 tablets by mouth daily. VitaFusion   rosuvastatin  (CRESTOR ) 40 MG tablet TAKE ONE TABLET BY MOUTH EVERY DAY   No facility-administered encounter medications on file as of 11/10/2024.   No Known Drug Allergies   REVIEW OF SYSTEMS:  Gen: Denies fever, sweats or chills. No weight loss.  CV: Denies chest pain, palpitations or edema. Resp: Denies cough, shortness of breath of hemoptysis.  GI: Denies heartburn, dysphagia, stomach or lower abdominal pain. No diarrhea or constipation.  GU: Denies urinary burning, blood in urine, increased urinary frequency or incontinence. MS: Denies joint pain, muscles aches or weakness. Derm: Denies rash, itchiness, skin lesions or unhealing ulcers. Psych: Denies depression, anxiety, memory loss or confusion. Heme: Denies bruising, easy bleeding. Neuro:  Denies headaches, dizziness or paresthesias. Endo:  Denies any problems with DM, thyroid  or adrenal function.  PHYSICAL EXAM: BP 100/72 (BP Location: Left Arm, Patient Position: Sitting, Cuff Size: Large)    Pulse 64   Ht 6' 0.5 (1.842 m) Comment: height measured without shoes  Wt 224 lb (101.6 kg)   BMI 29.96 kg/m  Wt Readings from Last 3 Encounters:  11/10/24 224 lb (101.6 kg)  06/16/24 220 lb (99.8 kg)  06/08/24 213 lb 10 oz (96.9 kg)     General: 48 year old male in no acute distress. Head: Normocephalic and atraumatic. Eyes:  Sclerae non-icteric, conjunctive pink. Ears: Normal auditory acuity. Mouth: Dentition intact. No ulcers or lesions.  Neck: Supple, no lymphadenopathy or thyromegaly.  Lungs: Clear bilaterally to auscultation without wheezes, crackles or rhonchi. Heart: Regular rate and rhythm. No murmur, rub or gallop appreciated.  Abdomen: Soft, nontender, nondistended. No masses. No hepatosplenomegaly. Normoactive bowel sounds x 4 quadrants.  Rectal:  Musculoskeletal: Symmetrical with no gross deformities. Skin: Warm and dry. No rash or lesions on visible extremities. Extremities: No edema. Neurological: Alert oriented x 4, no focal deficits.  Psychological: Alert and cooperative. Normal mood and affect.  ASSESSMENT AND PLAN:  History of MASH. Abdominal sonogram with elastography 03/17/2024 which showed a median kPa score of 9 which indicates patient is at increased risk for advanced liver disease. Normal albumin, platelet count and LFTs.  Patient has unfortunately gained 10 pounds over the past 5 months. -Patient declines referral to [facility] health weight and wellness -Patient counseled to reduce carbohydrates in diet i.e.: Reduce bread/pasta/potatoes/rice and sweets, exercise as tolerated and permitted by cardiology and to lose weight to reduce the risk of developing advanced fatty liver disease/cirrhosis -Hepatic panel -Future FibroScan when available in our office. Consider a liver biopsy if LFTs are elevated -Follow-up with PCP to consider GLP-1 for weight loss -Follow-up in 3 to 4 months   CAD s/p NSTEMI and PCI to RCA October 2024 Follows with Dr. Calhoun.   Echocardiogram with EF 55 to 60%.  History of NSTEMI s/p PCI October 2024 and then he returned a week later with recurrent symptoms and repeat cath showed patent stent.  Patient was on Plavix  and aspirin  x 1 year, now only on aspirin .   SVT, on Metoprolol  25 twice daily   Colon cancer screening.  Negative Cologuard 10/02/2024.  No known family history of colorectal cancer. -Recommend repeat Cologuard in 3 years versus conventional screening colonoscopy    CC:  McGowen, Aleene DEL, MD

## 2024-11-10 NOTE — Patient Instructions (Addendum)
 _______________________________________________________  If your blood pressure at your visit was 140/90 or greater, please contact your primary care physician to follow up on this.  _______________________________________________________  If you are age 47 or older, your body mass index should be between 23-30. Your Body mass index is 29.96 kg/m. If this is out of the aforementioned range listed, please consider follow up with your Primary Care Provider.  If you are age 20 or younger, your body mass index should be between 19-25. Your Body mass index is 29.96 kg/m. If this is out of the aformentioned range listed, please consider follow up with your Primary Care Provider.   ________________________________________________________  The [facility] providers would like to encourage you to use MYCHART to communicate with providers for non-urgent requests or questions.  Due to long hold times on the telephone, sending your provider a message by [facility] may be a faster and more efficient way to get a response.  Please allow 48 business hours for a response.  Please remember that this is for non-urgent requests.  _______________________________________________________  [facility] is using a team-based approach to care.  Your team is made up of your doctor and two to three APPS. Our APPS (Nurse Practitioners and Physician Assistants) work with your physician to ensure care continuity for you. They are fully qualified to address your health concerns and develop a treatment plan. They communicate directly with your gastroenterologist to care for you. Seeing the Advanced Practice Practitioners on your physician's team can help you by facilitating care more promptly, often allowing for earlier appointments, access to diagnostic testing, procedures, and other specialty referrals.    Reduce carbohydrates in your diet, ie: reduce bread/pasta/potato/rice and sweet intake, exercise as tolerated and  as permitted by your cardiologist and lose weight to reduce your risk of developing advanced fatty liver disease.    Your provider has requested that you go to the basement level for lab work before leaving today. Press B on the elevator. The lab is located at the first door on the left as you exit the elevator.  Please follow up in 3-4 months. Give us  a call at [phone] to schedule an appointment.  Thank you for trusting me with your gastrointestinal care!   Elida Shawl, CRNP

## 2024-11-15 NOTE — Progress Notes (Signed)
 Left a message for patient to call back.

## 2024-11-20 NOTE — Progress Notes (Signed)
 Agree with the assessment and plan as outlined by Alcide Evener, NP.    Zae Kirtz E. Tomasa Rand, MD [facility]

## 2024-12-06 ENCOUNTER — Encounter: Admitting: Family Medicine

## 2024-12-06 NOTE — Progress Notes (Deleted)
 "     Office Note 12/06/2024  CC: No chief complaint on file.   HPI:  Patient is a 49 y.o. male who is here for annual health maintenance exam and ***.  Past Medical History:  Diagnosis Date   ADHD    Anxiety    Aortic dilatation    Ascending, 43 mm, Dr. 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  Iron normal   Ruptured plantar fascia    left, 2022    Past Surgical History:  Procedure Laterality Date   CORONARY STENT INTERVENTION N/A 09/14/2023   Procedure: CORONARY STENT INTERVENTION;  Surgeon: Ladona Heinz, MD;  Location: [facility] INVASIVE CV LAB;  Service: Cardiovascular;  Laterality: N/A;   EXTERNAL EAR SURGERY Right    trauma (bit off)   LEFT HEART CATH AND CORONARY ANGIOGRAPHY N/A 09/14/2023   Procedure: LEFT HEART CATH AND CORONARY ANGIOGRAPHY;  Surgeon: Ladona Heinz, MD;  Location: [facility]VAS[facility] LAB;  Service: Cardiovascular;  Laterality: N/A;   LEFT HEART CATH AND CORONARY ANGIOGRAPHY N/A 09/22/2023   Procedure: LEFT HEART CATH AND CORONARY ANGIOGRAPHY;  Surgeon: Wonda Sharper, MD;  Location: [facility] INVASIVE CV LAB;  Service: Cardiovascular;  Laterality: N/A;   Rhythm monitoring     12/2023 multiple episodes of SVT--> DrRONITA Alveta started metoprolol    TRANSTHORACIC ECHOCARDIOGRAM     01/2024 nl except aortic dilat 43 mm.  05/2024 same.    Family History  Problem Relation Age of Onset   Rectal cancer Mother    Cancer Mother        Cholangiocarcinoma   Anxiety  disorder Mother    Early death Mother    Stroke Mother    Heart disease Father 74       CABG   Thyroid  disease Father    Depression Father    Hearing loss Father    Hypertension Father    Obesity Father    Heart disease Maternal Grandmother    Heart attack Maternal Grandfather    Early death Maternal Grandfather    Heart disease Maternal Grandfather    Crohn's disease Paternal Grandmother    Stroke Paternal Grandfather    Heart failure Paternal Grandfather    Heart disease Paternal Grandfather    Diabetes Paternal Aunt    Diabetes Paternal Uncle    Lactose intolerance Daughter    ADD / ADHD Daughter    Diabetes Maternal Uncle    Obesity Maternal Uncle    Obesity Paternal Aunt     Social History   Socioeconomic History   Marital status: Married    Spouse name: Not on file   Number of children: 2   Years of education: Not on file   Highest education level: Some college, no degree  Occupational History   Occupation: [employer]  Tobacco Use   Smoking status: Never   Smokeless tobacco: Never  Vaping Use   Vaping status: Never  Used  Substance and Sexual Activity   Alcohol use: Not Currently   Drug use: Never   Sexual activity: Yes    Birth control/protection: Condom  Other Topics Concern   Not on file  Social History Narrative   Married, 2 daughters.    Occupation: Works for [employer] family [employer].   No tobacco, alcohol, or drugs.   Caffiene green tea 2 cups a week   Social Drivers of Health   Tobacco Use: Low Risk (11/10/2024)   Patient History    Smoking Tobacco Use: Never    Smokeless Tobacco Use: Never    Passive Exposure: Not on file  Financial Resource Strain: Patient Declined (12/02/2024)   Overall Financial Resource Strain (CARDIA)    Difficulty of Paying Living Expenses: Patient declined  Food Insecurity: No Food Insecurity (12/02/2024)   Epic    Worried About Programme Researcher, Broadcasting/film/video in the Last Year: Never true    Ran Out of Food in the  Last Year: Never true  Transportation Needs: No Transportation Needs (12/02/2024)   Epic    Lack of Transportation (Medical): No    Lack of Transportation (Non-Medical): No  Physical Activity: Sufficiently Active (12/02/2024)   Exercise Vital Sign    Days of Exercise per Week: 5 days    Minutes of Exercise per Session: 30 min  Stress: Stress Concern Present (12/02/2024)   [employer] of Occupational Health - Occupational Stress Questionnaire    Feeling of Stress: Very much  Social Connections: Moderately Isolated (12/02/2024)   Social Connection and Isolation Panel    Frequency of Communication with Friends and Family: Twice a week    Frequency of Social Gatherings with Friends and Family: Once a week    Attends Religious Services: Never    Database Administrator or Organizations: No    Attends Engineer, Structural: Not on file    Marital Status: Married  Catering Manager Violence: Not At Risk (06/08/2024)   Epic    Fear of Current or Ex-Partner: No    Emotionally Abused: No    Physically Abused: No    Sexually Abused: No  Depression (PHQ2-9): Medium Risk (12/29/2023)   Depression (PHQ2-9)    PHQ-2 Score: 6  Alcohol Screen: Not on file  Housing: Unknown (12/02/2024)   Epic    Unable to Pay for Housing in the Last Year: Patient declined    Number of Times Moved in the Last Year: Not on file    Homeless in the Last Year: No  Utilities: Not At Risk (06/08/2024)   Epic    Threatened with loss of utilities: No  Health Literacy: Not on file    Outpatient Medications Prior to Visit  Medication Sig Dispense Refill   acetaminophen  (TYLENOL ) 325 MG tablet Take 2 tablets (650 mg total) by mouth every 6 (six) hours as needed for mild pain (pain score 1-3) (or Fever >/= 101). 20 tablet 0   ASHWAGANDHA GUMMIES PO Take 2 tablets by mouth See admin instructions. Chew 2 gummies by mouth in the morning     ASPIRIN  LOW DOSE 81 MG chewable tablet chew ONE TABLET BY MOUTH ONCE DAILY (Patient  taking differently: Chew 81 mg by mouth in the morning.) 90 tablet 1   atomoxetine  (STRATTERA ) 80 MG capsule Take 80 mg by mouth in the morning.     CAPLYTA  42 MG capsule Take 42 mg by mouth at bedtime.     cyclobenzaprine  (FLEXERIL ) 10 MG tablet Take 1  tablet (10 mg total) by mouth 2 (two) times daily as needed for muscle spasms. 10 tablet 0   lamoTRIgine  (LAMICTAL ) 200 MG tablet Take 200 mg by mouth See admin instructions. Take 200 mg by mouth in the morning and at 4 PM     metoprolol  tartrate (LOPRESSOR ) 25 MG tablet Take 1 tablet (25 mg total) by mouth 2 (two) times daily. 180 tablet 2   Misc Natural Products (AIRBORNE ELDERBERRY) 100-50 MG CHEW Chew 2 tablets by mouth daily.     Misc Natural Products (PUMPKIN SEED OIL) CAPS Take 3 capsules by mouth in the morning.     nitroGLYCERIN  (NITROSTAT ) 0.4 MG SL tablet Place 1 tablet (0.4 mg total) under the tongue every 5 (five) minutes x 3 doses as needed for chest pain. (Patient not taking: Reported on 11/10/2024) 25 tablet 1   Omega Fatty Acids-Vitamins (OMEGA-3 GUMMIES) CHEW Chew 2 tablets by mouth daily. VitaFusion     rosuvastatin  (CRESTOR ) 40 MG tablet TAKE ONE TABLET BY MOUTH EVERY DAY 90 tablet 2   No facility-administered medications prior to visit.    Allergies[1]  Review of Systems *** PE;    11/10/2024    9:00 AM 06/16/2024   11:21 AM 06/10/2024    5:32 AM  Vitals with BMI  Height 6' 0.5    Weight 224 lbs 220 lbs   BMI 29.95 28.23   Systolic 100 123 897  Diastolic 72 69 61  Pulse 64 73 65     *** Pertinent labs:  Lab Results  Component Value Date   TSH 1.31 03/14/2024   Lab Results  Component Value Date   WBC 8.8 06/08/2024   HGB 15.6 06/08/2024   HCT 45.7 06/08/2024   MCV 95.4 06/08/2024   PLT 161 06/08/2024   Lab Results  Component Value Date   CREATININE 0.83 06/08/2024   BUN 13 06/08/2024   NA 139 06/08/2024   K 3.5 06/08/2024   CL 102 06/08/2024   CO2 27 06/08/2024   Lab Results  Component  Value Date   ALT 23 11/10/2024   AST 23 11/10/2024   ALKPHOS 44 11/10/2024   BILITOT 0.6 11/10/2024   Lab Results  Component Value Date   CHOL 110 12/08/2023   Lab Results  Component Value Date   HDL 41 12/08/2023   Lab Results  Component Value Date   LDLCALC 42 12/08/2023   Lab Results  Component Value Date   TRIG 164 (H) 12/08/2023   Lab Results  Component Value Date   CHOLHDL 2.7 12/08/2023   Lab Results  Component Value Date   PSA 0.27 12/29/2023   Lab Results  Component Value Date   HGBA1C 5.4 12/08/2023   ASSESSMENT AND PLAN:   No problem-specific Assessment & Plan notes found for this encounter.   #1 health maintenance exam: Reviewed age and gender appropriate health maintenance issues (prudent diet, regular exercise, health risks of tobacco and excessive alcohol, use of seatbelts, fire alarms in home, use of sunscreen).  Also reviewed age and gender appropriate health screening as well as vaccine recommendations. Vaccines: Tdap ***.  Otherwise all UTD.   Labs: CBC, hemoglobin A1c, basic metabolic panel, PSA  Prostate ca screening: PSA ordered Colon ca screening: November 2025 Cologuard NEG.  Rpt this in 2028.  Hepatic panel normal 11/10/2024.  An After Visit Summary was printed and given to the patient.  FOLLOW UP:  No follow-ups on file.  Signed:  Phil Jullia Mulligan, MD  12/06/2024     [1] No Known Allergies  "

## 2024-12-08 ENCOUNTER — Encounter: Payer: Self-pay | Admitting: Family Medicine

## 2024-12-21 ENCOUNTER — Encounter: Payer: Self-pay | Admitting: Family Medicine

## 2024-12-21 ENCOUNTER — Ambulatory Visit: Admitting: Family Medicine

## 2024-12-21 VITALS — BP 110/74 | HR 56 | Temp 97.3°F | Ht 74.0 in | Wt 223.2 lb

## 2024-12-21 DIAGNOSIS — Z0000 Encounter for general adult medical examination without abnormal findings: Secondary | ICD-10-CM | POA: Diagnosis not present

## 2024-12-21 DIAGNOSIS — R072 Precordial pain: Secondary | ICD-10-CM | POA: Diagnosis not present

## 2024-12-21 DIAGNOSIS — R0609 Other forms of dyspnea: Secondary | ICD-10-CM

## 2024-12-21 DIAGNOSIS — I2510 Atherosclerotic heart disease of native coronary artery without angina pectoris: Secondary | ICD-10-CM | POA: Diagnosis not present

## 2024-12-21 DIAGNOSIS — Z125 Encounter for screening for malignant neoplasm of prostate: Secondary | ICD-10-CM

## 2024-12-21 DIAGNOSIS — E7800 Pure hypercholesterolemia, unspecified: Secondary | ICD-10-CM | POA: Diagnosis not present

## 2024-12-21 DIAGNOSIS — K760 Fatty (change of) liver, not elsewhere classified: Secondary | ICD-10-CM | POA: Diagnosis not present

## 2024-12-21 NOTE — Progress Notes (Signed)
 "     Office Note 12/21/2024  CC:  Chief Complaint  Patient presents with   Annual Exam    Pt is fasting   HPI:  Patient is a 49 y.o. male who is here for annual health maintenance exam.  He has been having some chest pains lately.  They have been sharp and over the left side, sometimes lasting a few seconds and sometimes longer.  Not clearly triggered by anything.  He does feel like he sometimes gets more short of breath when he exercises lately. No nausea.  He feels occasional slight abnormality in his jaw on the left.  No arm symptoms or nausea.  No palpitations or dizziness. He has not taken any nitroglycerin . No heartburn/reflux. No leg swelling or pain. No fever, no cough, no wheezing.  He is working on better diet and exercise habits, limited somewhat by back pain.  Past Medical History:  Diagnosis Date   ADHD    Aortic dilatation    Ascending, 43 mm, Dr. 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  Iron normal   Ruptured plantar fascia    left, 2022    Past Surgical History:  Procedure Laterality Date   CORONARY STENT INTERVENTION N/A 09/14/2023   Procedure: CORONARY STENT INTERVENTION;  Surgeon: Ladona Heinz, MD;  Location: [facility] INVASIVE CV [facility];  Service: Cardiovascular;  Laterality: N/A;   EXTERNAL EAR SURGERY Right    trauma (bit off)   LEFT HEART CATH AND CORONARY ANGIOGRAPHY N/A 09/14/2023   Procedure: LEFT HEART CATH AND CORONARY ANGIOGRAPHY;  Surgeon: Ladona Heinz, MD;  Location: [facility] CV [facility];  Service: Cardiovascular;  Laterality: N/A;   LEFT HEART  CATH AND CORONARY ANGIOGRAPHY N/A 09/22/2023   Procedure: LEFT HEART CATH AND CORONARY ANGIOGRAPHY;  Surgeon: Wonda Sharper, MD;  Location: [facility] INVASIVE CV LAB;  Service: Cardiovascular;  Laterality: N/A;   Rhythm monitoring     12/2023 multiple episodes of SVT--> DrRONITA Alveta started metoprolol    TRANSTHORACIC ECHOCARDIOGRAM     01/2024 nl except aortic dilat 43 mm.  05/2024 same.    Family History  Problem Relation Age of Onset   Rectal cancer Mother    Cancer Mother        Cholangiocarcinoma   Anxiety disorder Mother    Early death Mother    Stroke Mother    Heart disease Father 30       CABG   Thyroid  disease Father    Depression Father    Hearing loss Father    Hypertension Father    Obesity Father    Heart disease Maternal Grandmother    Heart attack Maternal Grandfather    Early death Maternal Grandfather    Heart disease Maternal Grandfather    Crohn's disease Paternal Grandmother    Stroke Paternal Grandfather    Heart failure Paternal Grandfather    Heart disease Paternal Grandfather    Diabetes Paternal Aunt    Diabetes Paternal Uncle    Lactose intolerance Daughter    ADD / ADHD Daughter    Diabetes  Maternal Uncle    Obesity Maternal Uncle    Obesity Paternal Aunt     Social History   Socioeconomic History   Marital status: Married    Spouse name: Not on file   Number of children: 2   Years of education: Not on file   Highest education level: Some college, no degree  Occupational History   Occupation: Special Educational Needs Teacher life insurance  Tobacco Use   Smoking status: Never   Smokeless tobacco: Never  Vaping Use   Vaping status: Never Used  Substance and Sexual Activity   Alcohol use: Not Currently   Drug use: Never   Sexual activity: Yes    Birth control/protection: Condom  Other Topics Concern   Not on file  Social History Narrative   Married, 2 daughters.    Occupation: Works for [employer] family heritage.   No tobacco, alcohol, or drugs.   Caffiene  green tea 2 cups a week   Social Drivers of Health   Tobacco Use: Low Risk (12/21/2024)   Patient History    Smoking Tobacco Use: Never    Smokeless Tobacco Use: Never    Passive Exposure: Not on file  Financial Resource Strain: Patient Declined (12/19/2024)   Overall Financial Resource Strain (CARDIA)    Difficulty of Paying Living Expenses: Patient declined  Food Insecurity: No Food Insecurity (12/19/2024)   Epic    Worried About Programme Researcher, Broadcasting/film/video in the Last Year: Never true    Ran Out of Food in the Last Year: Never true  Transportation Needs: No Transportation Needs (12/19/2024)   Epic    Lack of Transportation (Medical): No    Lack of Transportation (Non-Medical): No  Physical Activity: Sufficiently Active (12/19/2024)   Exercise Vital Sign    Days of Exercise per Week: 5 days    Minutes of Exercise per Session: 30 min  Stress: Stress Concern Present (12/19/2024)   [employer] of Occupational Health - Occupational Stress Questionnaire    Feeling of Stress: Very much  Social Connections: Moderately Isolated (12/19/2024)   Social Connection and Isolation Panel    Frequency of Communication with Friends and Family: Twice a week    Frequency of Social Gatherings with Friends and Family: Once a week    Attends Religious Services: Never    Database Administrator or Organizations: No    Attends Engineer, Structural: Not on file    Marital Status: Married  Catering Manager Violence: Not At Risk (06/08/2024)   Epic    Fear of Current or Ex-Partner: No    Emotionally Abused: No    Physically Abused: No    Sexually Abused: No  Depression (PHQ2-9): Low Risk (12/21/2024)   Depression (PHQ2-9)    PHQ-2 Score: 0  Alcohol Screen: Not on file  Housing: Unknown (12/19/2024)   Epic    Unable to Pay for Housing in the Last Year: Patient declined    Number of Times Moved in the Last Year: Not on file    Homeless in the Last Year: No  Utilities: Not At Risk (06/08/2024)   Epic     Threatened with loss of utilities: No  Health Literacy: Not on file    Outpatient Medications Prior to Visit  Medication Sig Dispense Refill   acetaminophen  (TYLENOL ) 325 MG tablet Take 2 tablets (650 mg total) by mouth every 6 (six) hours as needed for mild pain (pain score 1-3) (or Fever >/= 101). 20 tablet 0   ASHWAGANDHA GUMMIES  PO Take 2 tablets by mouth See admin instructions. Chew 2 gummies by mouth in the morning     ASPIRIN  LOW DOSE 81 MG chewable tablet chew ONE TABLET BY MOUTH ONCE DAILY 90 tablet 1   atomoxetine  (STRATTERA ) 80 MG capsule Take 80 mg by mouth in the morning.     CAPLYTA  42 MG capsule Take 42 mg by mouth at bedtime.     cyclobenzaprine  (FLEXERIL ) 10 MG tablet Take 1 tablet (10 mg total) by mouth 2 (two) times daily as needed for muscle spasms. 10 tablet 0   lamoTRIgine  (LAMICTAL ) 200 MG tablet Take 200 mg by mouth See admin instructions. Take 200 mg by mouth in the morning and at 4 PM     metoprolol  tartrate (LOPRESSOR ) 25 MG tablet Take 1 tablet (25 mg total) by mouth 2 (two) times daily. 180 tablet 2   Misc Natural Products (AIRBORNE ELDERBERRY) 100-50 MG CHEW Chew 2 tablets by mouth daily.     nitroGLYCERIN  (NITROSTAT ) 0.4 MG SL tablet Place 1 tablet (0.4 mg total) under the tongue every 5 (five) minutes x 3 doses as needed for chest pain. 25 tablet 1   Omega Fatty Acids-Vitamins (OMEGA-3 GUMMIES) CHEW Chew 2 tablets by mouth daily. VitaFusion     rosuvastatin  (CRESTOR ) 40 MG tablet TAKE ONE TABLET BY MOUTH EVERY DAY 90 tablet 2   Misc Natural Products (PUMPKIN SEED OIL) CAPS Take 3 capsules by mouth in the morning. (Patient not taking: Reported on 12/21/2024)     No facility-administered medications prior to visit.    Allergies[1]  Review of Systems  Constitutional:  Negative for appetite change, chills, fatigue and fever.  HENT:  Negative for congestion, dental problem, ear pain and sore throat.   Eyes:  Negative for discharge, redness and visual  disturbance.  Respiratory:  Positive for shortness of breath. Negative for cough, chest tightness and wheezing.   Cardiovascular:  Positive for chest pain. Negative for palpitations and leg swelling.  Gastrointestinal:  Negative for abdominal pain, blood in stool, diarrhea, nausea and vomiting.  Genitourinary:  Negative for difficulty urinating, dysuria, flank pain, frequency, hematuria and urgency.  Musculoskeletal:  Negative for arthralgias, back pain, joint swelling, myalgias and neck stiffness.  Skin:  Negative for pallor and rash.  Neurological:  Negative for dizziness, speech difficulty, weakness and headaches.  Hematological:  Negative for adenopathy. Does not bruise/bleed easily.  Psychiatric/Behavioral:  Negative for confusion and sleep disturbance. The patient is not nervous/anxious.     PE;    12/21/2024    3:45 PM 11/10/2024    9:00 AM 06/16/2024   11:21 AM  Vitals with BMI  Height 6' 2 6' 0.5   Weight 223 lbs 3 oz 224 lbs 220 lbs  BMI 28.65 29.95 28.23  Systolic 110 100 876  Diastolic 74 72 69  Pulse 56 64 73   Gen: Alert, well appearing.  Patient is oriented to person, place, time, and situation. AFFECT: pleasant, lucid thought and speech. ENT: Ears: EACs clear, normal epithelium.  TMs with good light reflex and landmarks bilaterally.  Eyes: no injection, icteris, swelling, or exudate.  EOMI, PERRLA. Nose: no drainage or turbinate edema/swelling.  No injection or focal lesion.  Mouth: lips without lesion/swelling.  Oral mucosa pink and moist.  Dentition intact and without obvious caries or gingival swelling.  Oropharynx without erythema, exudate, or swelling.  Neck: supple/nontender.  No LAD, mass, or TM.  Carotid pulses 2+ bilaterally, without bruits. CV: RRR, no m/r/g.  No  chest wall tenderness. LUNGS: CTA bilat, nonlabored resps, good aeration in all lung fields. ABD: soft, NT, ND, BS normal.  No hepatospenomegaly or mass.  No bruits. EXT: no clubbing, cyanosis, or  edema.  Musculoskeletal: no joint swelling, erythema, warmth, or tenderness.  ROM of all joints intact. Skin - no sores or suspicious lesions or rashes or color changes  Pertinent labs:  Lab Results  Component Value Date   TSH 1.31 03/14/2024   Lab Results  Component Value Date   WBC 8.8 06/08/2024   HGB 15.6 06/08/2024   HCT 45.7 06/08/2024   MCV 95.4 06/08/2024   PLT 161 06/08/2024   Lab Results  Component Value Date   IRON 94 09/14/2020   TIBC 344 09/14/2020   FERRITIN 251 09/14/2020   Lab Results  Component Value Date   CREATININE 0.83 06/08/2024   BUN 13 06/08/2024   NA 139 06/08/2024   K 3.5 06/08/2024   CL 102 06/08/2024   CO2 27 06/08/2024   Lab Results  Component Value Date   ALT 23 11/10/2024   AST 23 11/10/2024   ALKPHOS 44 11/10/2024   BILITOT 0.6 11/10/2024   Lab Results  Component Value Date   CHOL 110 12/08/2023   Lab Results  Component Value Date   HDL 41 12/08/2023   Lab Results  Component Value Date   LDLCALC 42 12/08/2023   Lab Results  Component Value Date   TRIG 164 (H) 12/08/2023   Lab Results  Component Value Date   CHOLHDL 2.7 12/08/2023   Lab Results  Component Value Date   PSA 0.27 12/29/2023   Lab Results  Component Value Date   HGBA1C 5.4 12/08/2023   ASSESSMENT AND PLAN:   #1 health maintenance exam: Reviewed age and gender appropriate health maintenance issues (prudent diet, regular exercise, health risks of tobacco and excessive alcohol, use of seatbelts, fire alarms in home, use of sunscreen).  Also reviewed age and gender appropriate health screening as well as vaccine recommendations. Vaccines: Tdap -->declined.  Otherwise all UTD.   Labs: CBC, hemoglobin A1c, basic metabolic panel, PSA  Prostate ca screening: PSA ordered Colon ca screening: November 2025 Cologuard NEG.  Rpt this in 2028.  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 Hepatic panel normal 11/10/2024. He is followed by  GI. Focus is weight loss.  #3 atypical chest pain.  History of MI and stenting last year. Reassured. Unfortunately, our EKG machine today would not work. He is going to make appointment for follow-up with his cardiologist as soon as possible. Continue aspirin , statin, beta-blocker.  An After Visit Summary was printed and given to the patient.  FOLLOW UP:  Return in about 1 year (around 12/21/2025) for annual CPE (fasting).  Signed:  Gerlene Hockey, MD           12/21/2024       [1] No Known Allergies  "

## 2024-12-22 ENCOUNTER — Ambulatory Visit: Payer: Self-pay | Admitting: Family Medicine

## 2024-12-22 ENCOUNTER — Telehealth: Payer: Self-pay | Admitting: Cardiology

## 2024-12-22 LAB — CBC WITH DIFFERENTIAL/PLATELET
Basophils Absolute: 0.1 K/uL (ref 0.0–0.1)
Basophils Relative: 1.6 % (ref 0.0–3.0)
Eosinophils Absolute: 0 K/uL (ref 0.0–0.7)
Eosinophils Relative: 0.2 % (ref 0.0–5.0)
HCT: 44.2 % (ref 39.0–52.0)
Hemoglobin: 15.3 g/dL (ref 13.0–17.0)
Lymphocytes Relative: 28.6 % (ref 12.0–46.0)
Lymphs Abs: 1.4 K/uL (ref 0.7–4.0)
MCHC: 34.6 g/dL (ref 30.0–36.0)
MCV: 92.8 fl (ref 78.0–100.0)
Monocytes Absolute: 0.4 K/uL (ref 0.1–1.0)
Monocytes Relative: 8.8 % (ref 3.0–12.0)
Neutro Abs: 3.1 K/uL (ref 1.4–7.7)
Neutrophils Relative %: 60.8 % (ref 43.0–77.0)
Platelets: 175 K/uL (ref 150.0–400.0)
RBC: 4.76 Mil/uL (ref 4.22–5.81)
RDW: 12.8 % (ref 11.5–15.5)
WBC: 5 K/uL (ref 4.0–10.5)

## 2024-12-22 LAB — LIPID PANEL
Cholesterol: 76 mg/dL (ref 28–200)
HDL: 45.3 mg/dL
LDL Cholesterol: 21 mg/dL (ref 10–99)
NonHDL: 31.13
Total CHOL/HDL Ratio: 2
Triglycerides: 52 mg/dL (ref 10.0–149.0)
VLDL: 10.4 mg/dL (ref 0.0–40.0)

## 2024-12-22 LAB — COMPREHENSIVE METABOLIC PANEL WITH GFR
ALT: 27 U/L (ref 3–53)
AST: 27 U/L (ref 5–37)
Albumin: 4.7 g/dL (ref 3.5–5.2)
Alkaline Phosphatase: 40 U/L (ref 39–117)
BUN: 8 mg/dL (ref 6–23)
CO2: 31 meq/L (ref 19–32)
Calcium: 8.9 mg/dL (ref 8.4–10.5)
Chloride: 103 meq/L (ref 96–112)
Creatinine, Ser: 0.93 mg/dL (ref 0.40–1.50)
GFR: 97.74 mL/min
Glucose, Bld: 97 mg/dL (ref 70–99)
Potassium: 4.4 meq/L (ref 3.5–5.1)
Sodium: 141 meq/L (ref 135–145)
Total Bilirubin: 0.6 mg/dL (ref 0.2–1.2)
Total Protein: 6.6 g/dL (ref 6.0–8.3)

## 2024-12-22 LAB — TSH: TSH: 1.39 u[IU]/mL (ref 0.35–5.50)

## 2024-12-22 LAB — PSA: PSA: 0.22 ng/mL (ref 0.10–4.00)

## 2024-12-22 LAB — D-DIMER, QUANTITATIVE: D-Dimer, Quant: <0.19 ug{FEU}/mL

## 2024-12-22 LAB — HEMOGLOBIN A1C: Hgb A1c MFr Bld: 5.2 % (ref 4.6–6.5)

## 2024-12-22 NOTE — Telephone Encounter (Signed)
" °  Per MyChart scheduling message:   Pt c/o of Chest Pain: STAT if active (IN THIS MOMENT) CP, including tightness, pressure, jaw pain, shoulder/upper arm/back pain, SOB, nausea, and vomiting.  1. Are you having CP right now (tightness, pressure, or discomfort)?   2. Are you experiencing any other symptoms (ex. SOB, nausea, vomiting, sweating)?   3. How long have you been experiencing CP?   4. Is your CP continuous or coming and going?   5. Have you taken Nitroglycerin ?   6. If CP returns before callback, please consider calling 911. ?   Initial complaint: I have been experiencing sharp pains in my chest in the area of my heart for a couple of weeks now.   1- No   2- Shortness of breath occasionally and with the chest pains    3- A couple of weeks now    4- The pains are sporadic they are also associated when I exercise    5- No nitro "

## 2024-12-22 NOTE — Telephone Encounter (Addendum)
 Called patient back about his message. Patient stated he has been having sharp left sided chest pain that radiates to his jaw that comes and goes. Patient stated this has been happening for the last few days while with activity and rest. Patient stated he also gets SOB with exercise. Patient saw his PCP yesterday and he had lab work done, but they could not do an EKG. Made patient an appointment with DOD tomorrow. Patient will go to ED if chest pain comes back and not relieved with nitroglycerin . Will send to his cardiologist.

## 2024-12-23 ENCOUNTER — Ambulatory Visit: Attending: Cardiovascular Disease | Admitting: Cardiovascular Disease

## 2024-12-23 ENCOUNTER — Encounter: Payer: Self-pay | Admitting: Cardiovascular Disease

## 2024-12-23 VITALS — BP 102/66 | HR 62 | Ht 74.0 in | Wt 221.0 lb

## 2024-12-23 DIAGNOSIS — R079 Chest pain, unspecified: Secondary | ICD-10-CM | POA: Diagnosis present

## 2024-12-23 DIAGNOSIS — I2510 Atherosclerotic heart disease of native coronary artery without angina pectoris: Secondary | ICD-10-CM | POA: Insufficient documentation

## 2024-12-23 DIAGNOSIS — R0602 Shortness of breath: Secondary | ICD-10-CM | POA: Diagnosis present

## 2024-12-23 NOTE — Patient Instructions (Signed)
 Medication Instructions:  Your physician recommends that you continue on your current medications as directed. Please refer to the Current Medication list given to you today.  *If you need a refill on your cardiac medications before your next appointment, please call your pharmacy*  Lab Work: BMET, BNP, Troponin labs today  Testing/Procedures: Your physician has requested that you have an echocardiogram. Echocardiography is a painless test that uses sound waves to create images of your heart. It provides your doctor with information about the size and shape of your heart and how well your hearts chambers and valves are working. This procedure takes approximately one hour. There are no restrictions for this procedure. Please do NOT wear cologne, perfume, aftershave, or lotions (deodorant is allowed). Please arrive 15 minutes prior to your appointment time.  Please note: We ask at that you not bring children with you during ultrasound (echo/ vascular) testing. Due to room size and safety concerns, children are not allowed in the ultrasound rooms during exams. Our front office staff cannot provide observation of children in our lobby area while testing is being conducted. An adult accompanying a patient to their appointment will only be allowed in the ultrasound room at the discretion of the ultrasound technician under special circumstances. We apologize for any inconvenience.  Chest XRAY  Your physician has requested that you have a Myocardial Perfusion Imaging Study.  Please arrive 15 minutes prior to your appointment time for registration.  The test will take approximately 3 to 4 hours to complete.  Instructions: Beta blockers should be held for 24 hours prior to testing. Otherwise, you may take your medications the morning of the test (someone from the testing area will call you with these specific instructions). Do not eat or drink 3 hours prior to your test, except you may have unlimited  water. Do not consume products containing caffeine (regular or decaffeinated) for 12 hours prior to your test (coffee, tea, soda, chocolate, energy products, and some medications). Please wear a 2-piece outfit (no dresses, overalls, etc.). Do not wear cologne, perfume, aftershave, or lotions (deodorant is allowed). If you use an inhaler, bring it with you to the test.  Please note: If anyone comes with you to the appointment, they will need to remain in the main lobby due to limited space in the testing area. We also ask that you not bring children with you during testing. Due to room size and safety concerns, children are not allowed in the testing rooms during exams. Our front office staff cannot provide observation of children in our lobby area while testing is being conducted.   Follow-Up: At [facility], you and your health needs are our priority.  As part of our continuing mission to provide you with exceptional heart care, our providers are all part of one team.  This team includes your primary Cardiologist (physician) and Advanced Practice Providers or APPs (Physician Assistants and Nurse Practitioners) who all work together to provide you with the care you need, when you need it.  Your next appointment:   Next available   Provider:   Redell Shallow, MD    We recommend signing up for the patient portal called MyChart.  Sign up information is provided on this After Visit Summary.  MyChart is used to connect with patients for Virtual Visits (Telemedicine).  Patients are able to view lab/test results, encounter notes, upcoming appointments, etc.  Non-urgent messages can be sent to your provider as well.   To learn more about  what you can do with MyChart, go to [email or web address].   Other Instructions

## 2024-12-23 NOTE — Progress Notes (Signed)
 "    HPI: Patient added on to DOD schedule for chest pain.  Previously followed by Dr. Alveta and now Dr Pietro.   Patient admitted with non-ST elevation myocardial infarction October 2024.  Cardiac catheterization revealed occluded RCA.  Patient had PCI of the right coronary artery.  Follow-up catheterization 8 days later showed widely patent RCA stent with no stenosis and otherwise no obstructive disease.  Monitor January 2025 showed sinus rhythm with short episodes of nonsustained SVT, rare PVC and PAC.  Echocardiogram March 2025 showed normal LV function, mild mitral regurgitation, trace aortic insufficiency and mildly dilated ascending aorta at 43 mm.     He seems anxious. Complains of sharp pain in chest sometimes radiates to jaw. He has not taken nitroglycerin  for it He has some palpitations. Pains can be at rest or walking Not exactly like issues before stent.  ECG in office today is normal. Dyspnea with no illness or recent infection No recent fever. No pleurisy or LE edema    Current Outpatient Medications  Medication Sig Dispense Refill   acetaminophen  (TYLENOL ) 325 MG tablet Take 2 tablets (650 mg total) by mouth every 6 (six) hours as needed for mild pain (pain score 1-3) (or Fever >/= 101). 20 tablet 0   ASHWAGANDHA GUMMIES PO Take 2 tablets by mouth See admin instructions. Chew 2 gummies by mouth in the morning     ASPIRIN  LOW DOSE 81 MG chewable tablet chew ONE TABLET BY MOUTH ONCE DAILY 90 tablet 1   atomoxetine  (STRATTERA ) 80 MG capsule Take 80 mg by mouth in the morning.     CAPLYTA  42 MG capsule Take 42 mg by mouth at bedtime.     cyclobenzaprine  (FLEXERIL ) 10 MG tablet Take 1 tablet (10 mg total) by mouth 2 (two) times daily as needed for muscle spasms. 10 tablet 0   lamoTRIgine  (LAMICTAL ) 200 MG tablet Take 200 mg by mouth See admin instructions. Take 200 mg by mouth in the morning and at 4 PM     metoprolol  tartrate (LOPRESSOR ) 25 MG tablet Take 1 tablet (25 mg total) by  mouth 2 (two) times daily. 180 tablet 2   Misc Natural Products (AIRBORNE ELDERBERRY) 100-50 MG CHEW Chew 2 tablets by mouth daily.     nitroGLYCERIN  (NITROSTAT ) 0.4 MG SL tablet Place 1 tablet (0.4 mg total) under the tongue every 5 (five) minutes x 3 doses as needed for chest pain. 25 tablet 1   Omega Fatty Acids-Vitamins (OMEGA-3 GUMMIES) CHEW Chew 2 tablets by mouth daily. VitaFusion     rosuvastatin  (CRESTOR ) 40 MG tablet TAKE ONE TABLET BY MOUTH EVERY DAY 90 tablet 2   No current facility-administered medications for this visit.     Past Medical History:  Diagnosis Date   ADHD    Aortic dilatation    Ascending, 43 mm, Dr. 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  Iron normal   Ruptured plantar fascia    left, 2022    Past Surgical History:  Procedure Laterality Date   CORONARY STENT INTERVENTION N/A  09/14/2023   Procedure: CORONARY STENT INTERVENTION;  Surgeon: Ladona Heinz, MD;  Location: [facility];  Service: Cardiovascular;  Laterality: N/A;   EXTERNAL EAR SURGERY Right    trauma (bit off)   LEFT HEART CATH AND CORONARY ANGIOGRAPHY N/A 09/14/2023   Procedure: LEFT HEART CATH AND CORONARY ANGIOGRAPHY;  Surgeon: Ladona Heinz, MD;  Location: [facility];  Service: Cardiovascular;  Laterality: N/A;   LEFT HEART CATH AND CORONARY ANGIOGRAPHY N/A 09/22/2023   Procedure: LEFT HEART CATH AND CORONARY ANGIOGRAPHY;  Surgeon: Wonda Sharper, MD;  Location: [facility]IVE CV LAB;  Service: Cardiovascular;  Laterality: N/A;   Rhythm monitoring     12/2023 multiple  episodes of SVT--> DrRONITA Passe started metoprolol    TRANSTHORACIC ECHOCARDIOGRAM     01/2024 nl except aortic dilat 43 mm.  05/2024 same.    Social History   Socioeconomic History   Marital status: Married    Spouse name: Not on file   Number of children: 2   Years of education: Not on file   Highest education level: Some college, no degree  Occupational History   Occupation: [employer]  Tobacco Use   Smoking status: Never   Smokeless tobacco: Never  Vaping Use   Vaping status: Never Used  Substance and Sexual Activity   Alcohol use: Not Currently   Drug use: Never   Sexual activity: Yes    Birth control/protection: Condom  Other Topics Concern   Not on file  Social History Narrative   Married, 2 daughters.    Occupation: Works for [employer].   No tobacco, alcohol, or drugs.   Caffiene green tea 2 cups a week   Social Drivers of Health   Tobacco Use: Low Risk (12/23/2024)   Patient History    Smoking Tobacco Use: Never    Smokeless Tobacco Use: Never    Passive Exposure: Not on file  Financial Resource Strain: Patient Declined (12/19/2024)   Overall Financial Resource Strain (CARDIA)    Difficulty of Paying Living Expenses: Patient declined  Food Insecurity: No Food Insecurity (12/19/2024)   Epic    Worried About Programme Researcher, Broadcasting/film/video in the Last Year: Never true    Ran Out of Food in the Last Year: Never true  Transportation Needs: No Transportation Needs (12/19/2024)   Epic    Lack of Transportation (Medical): No    Lack of Transportation (Non-Medical): No  Physical Activity: Sufficiently Active (12/19/2024)   Exercise Vital Sign    Days of Exercise per Week: 5 days    Minutes of Exercise per Session: 30 min  Stress: Stress Concern Present (12/19/2024)   [employer] of Occupational Health - Occupational Stress Questionnaire    Feeling of Stress: Very much  Social Connections: Moderately Isolated (12/19/2024)   Social Connection and  Isolation Panel    Frequency of Communication with Friends and Family: Twice a week    Frequency of Social Gatherings with Friends and Family: Once a week    Attends Religious Services: Never    Database Administrator or Organizations: No    Attends Banker Meetings: Not on file    Marital Status: Married  Intimate Partner Violence: Not At Risk (06/08/2024)   Epic    Fear of Current or Ex-Partner: No    Emotionally Abused: No    Physically Abused: No    Sexually Abused: No  Depression (PHQ2-9): Low Risk (12/21/2024)   Depression (PHQ2-9)  PHQ-2 Score: 0  Alcohol Screen: Not on file  Housing: Unknown (12/19/2024)   Epic    Unable to Pay for Housing in the Last Year: Patient declined    Number of Times Moved in the Last Year: Not on file    Homeless in the Last Year: No  Utilities: Not At Risk (06/08/2024)   Epic    Threatened with loss of utilities: No  Health Literacy: Not on file    Family History  Problem Relation Age of Onset   Rectal cancer Mother    Cancer Mother        Cholangiocarcinoma   Anxiety disorder Mother    Early death Mother    Stroke Mother    Heart disease Father 32       CABG   Thyroid  disease Father    Depression Father    Hearing loss Father    Hypertension Father    Obesity Father    Heart disease Maternal Grandmother    Heart attack Maternal Grandfather    Early death Maternal Grandfather    Heart disease Maternal Grandfather    Crohn's disease Paternal Grandmother    Stroke Paternal Grandfather    Heart failure Paternal Grandfather    Heart disease Paternal Grandfather    Diabetes Paternal Aunt    Diabetes Paternal Uncle    Lactose intolerance Daughter    ADD / ADHD Daughter    Diabetes Maternal Uncle    Obesity Maternal Uncle    Obesity Paternal Aunt     ROS: no fevers or chills, productive cough, hemoptysis, dysphasia, odynophagia, melena, hematochezia, dysuria, hematuria, rash, seizure activity, orthopnea, PND, pedal  edema, claudication. Remaining systems are negative.  Affect appropriate Healthy:  appears stated age HEENT: normal Neck supple with no adenopathy JVP normal no bruits no thyromegaly Lungs clear with no wheezing and good diaphragmatic motion Heart:  S1/S2 no murmur, no rub, gallop or click PMI normal Abdomen: benighn, BS positve, no tenderness, no AAA no bruit.  No HSM or HJR Distal pulses intact with no bruits No edema Neuro non-focal Skin warm and dry No muscular weakness    A/P  1 coronary artery disease- stent to proximal/mid RCA 09/22/23 No left sided disease. Great result with no residual stenosis and large 3.5 mm stent. Cath 8 days latter for pain stent widely patent.  Plavix  d/c November 2025.ECG in office today is normal SR no acute changes.  Shared decision making favor EX myovue to risk stratify. ER precautions discussed with patient  Check troponin x 1  2 hyperlipidemia-continue statin. LDL way below goal 21   3 dilated aortic root-noted  4.3 cm on CTA 06/07/24    4. Dyspnea:  normal ECG and rhythm normal exam Check lab work and echo   Echo for CAD/Dyspnea Ex Myovue for chest pain post RCA stent BMET/TSH/BNP Troponin   F/U with Dr Pietro after stress test   Maude Emmer, MD    "

## 2024-12-24 LAB — SYPHILIS: RPR W/REFLEX TO RPR TITER AND TREPONEMAL ANTIBODIES, TRADITIONAL SCREENING AND DIAGNOSIS ALGORITHM: RPR Ser Ql: NONREACTIVE

## 2024-12-28 ENCOUNTER — Other Ambulatory Visit: Payer: Self-pay | Admitting: Cardiovascular Disease

## 2024-12-28 DIAGNOSIS — R079 Chest pain, unspecified: Secondary | ICD-10-CM

## 2024-12-29 ENCOUNTER — Ambulatory Visit (HOSPITAL_COMMUNITY)
Admission: RE | Admit: 2024-12-29 | Discharge: 2024-12-29 | Disposition: A | Discharge status: Home / Self Care | Source: Ambulatory Visit | Attending: Cardiology | Admitting: Cardiology

## 2024-12-29 DIAGNOSIS — R079 Chest pain, unspecified: Secondary | ICD-10-CM | POA: Diagnosis present

## 2024-12-29 LAB — MYOCARDIAL PERFUSION IMAGING
Angina Index: 0
Estimated workload: 12.5
Exercise duration (min): 10 min
Exercise duration (sec): 30 s
LV dias vol: 129 mL (ref 62–150)
LV sys vol: 48 mL
MPHR: 173 {beats}/min
Nuc Stress EF: 63 %
Peak HR: 154 {beats}/min
Percent HR: 88 %
RPE: 19
Rest HR: 58 {beats}/min
Rest Nuclear Isotope Dose: 10.3 mCi
SDS: 0
SRS: 1
SSS: 1
Stress Nuclear Isotope Dose: 32.9 mCi
TID: 0.83

## 2024-12-29 MED ORDER — TECHNETIUM TC 99M TETROFOSMIN IV KIT
10.3000 | PACK | Freq: Once | INTRAVENOUS | Status: AC | PRN
  Administered 2024-12-29: 10.3 via INTRAVENOUS

## 2024-12-29 MED ORDER — TECHNETIUM TC 99M TETROFOSMIN IV KIT
32.9000 | PACK | Freq: Once | INTRAVENOUS | Status: AC | PRN
  Administered 2024-12-29: 32.9 via INTRAVENOUS

## 2024-12-31 ENCOUNTER — Ambulatory Visit: Payer: Self-pay | Admitting: Cardiovascular Disease

## 2025-01-01 ENCOUNTER — Ambulatory Visit: Payer: Self-pay | Admitting: Cardiology

## 2025-02-01 ENCOUNTER — Other Ambulatory Visit (HOSPITAL_COMMUNITY)

## 2025-02-01 ENCOUNTER — Ambulatory Visit (HOSPITAL_COMMUNITY)

## 2025-02-15 ENCOUNTER — Other Ambulatory Visit (HOSPITAL_COMMUNITY)

## 2025-04-18 ENCOUNTER — Ambulatory Visit: Admitting: Cardiology
